# Patient Record
Sex: Male | Born: 1955
Health system: Southern US, Community
[De-identification: ages and names within clinical notes are randomized; demographics above are authoritative.]

## PROBLEM LIST (undated history)

## (undated) DIAGNOSIS — C4491 Basal cell carcinoma of skin, unspecified: Secondary | ICD-10-CM

## (undated) DIAGNOSIS — Z9289 Personal history of other medical treatment: Secondary | ICD-10-CM

## (undated) DIAGNOSIS — T8859XA Other complications of anesthesia, initial encounter: Secondary | ICD-10-CM

## (undated) DIAGNOSIS — E119 Type 2 diabetes mellitus without complications: Secondary | ICD-10-CM

## (undated) DIAGNOSIS — R42 Dizziness and giddiness: Secondary | ICD-10-CM

## (undated) DIAGNOSIS — I5189 Other ill-defined heart diseases: Secondary | ICD-10-CM

## (undated) DIAGNOSIS — N400 Enlarged prostate without lower urinary tract symptoms: Secondary | ICD-10-CM

## (undated) DIAGNOSIS — I480 Paroxysmal atrial fibrillation: Secondary | ICD-10-CM

## (undated) DIAGNOSIS — M1990 Unspecified osteoarthritis, unspecified site: Secondary | ICD-10-CM

## (undated) DIAGNOSIS — E785 Hyperlipidemia, unspecified: Secondary | ICD-10-CM

## (undated) HISTORY — DX: Hyperlipidemia, unspecified: E78.5

## (undated) HISTORY — DX: Type 2 diabetes mellitus without complications: E11.9

## (undated) HISTORY — PX: HERNIA REPAIR: SHX51

## (undated) HISTORY — DX: Benign prostatic hyperplasia without lower urinary tract symptoms: N40.0

## (undated) HISTORY — PX: MOHS SURGERY: SHX181

## (undated) HISTORY — DX: Other ill-defined heart diseases: I51.89

## (undated) HISTORY — PX: KNEE SURGERY: SHX244

## (undated) HISTORY — DX: Basal cell carcinoma of skin, unspecified: C44.91

---

## 2009-04-28 ENCOUNTER — Ambulatory Visit: Payer: Self-pay

## 2009-05-24 ENCOUNTER — Ambulatory Visit: Payer: Self-pay | Admitting: Surgery

## 2009-06-04 ENCOUNTER — Ambulatory Visit: Payer: Self-pay | Admitting: Surgery

## 2012-12-26 ENCOUNTER — Other Ambulatory Visit: Payer: Self-pay | Admitting: *Deleted

## 2012-12-26 DIAGNOSIS — E1165 Type 2 diabetes mellitus with hyperglycemia: Secondary | ICD-10-CM | POA: Insufficient documentation

## 2012-12-26 DIAGNOSIS — E119 Type 2 diabetes mellitus without complications: Secondary | ICD-10-CM

## 2012-12-30 ENCOUNTER — Other Ambulatory Visit: Payer: Self-pay

## 2013-01-13 ENCOUNTER — Encounter: Payer: Self-pay | Admitting: Endocrinology

## 2013-01-13 ENCOUNTER — Other Ambulatory Visit: Payer: BC Managed Care – PPO

## 2013-01-13 ENCOUNTER — Ambulatory Visit (INDEPENDENT_AMBULATORY_CARE_PROVIDER_SITE_OTHER): Payer: BC Managed Care – PPO | Admitting: Endocrinology

## 2013-01-13 VITALS — BP 120/70 | HR 77 | Temp 98.2°F | Resp 12 | Ht 75.0 in | Wt 237.2 lb

## 2013-01-13 DIAGNOSIS — E119 Type 2 diabetes mellitus without complications: Secondary | ICD-10-CM

## 2013-01-13 DIAGNOSIS — I10 Essential (primary) hypertension: Secondary | ICD-10-CM

## 2013-01-13 DIAGNOSIS — E7800 Pure hypercholesterolemia, unspecified: Secondary | ICD-10-CM | POA: Insufficient documentation

## 2013-01-13 LAB — URINALYSIS
Hgb urine dipstick: NEGATIVE
Urine Glucose: NEGATIVE
Urobilinogen, UA: 1 (ref 0.0–1.0)

## 2013-01-13 LAB — COMPREHENSIVE METABOLIC PANEL
AST: 22 U/L (ref 0–37)
Albumin: 3.9 g/dL (ref 3.5–5.2)
BUN: 11 mg/dL (ref 6–23)
CO2: 26 mEq/L (ref 19–32)
Calcium: 9.2 mg/dL (ref 8.4–10.5)
Chloride: 105 mEq/L (ref 96–112)
Glucose, Bld: 94 mg/dL (ref 70–99)
Potassium: 3.5 mEq/L (ref 3.5–5.1)

## 2013-01-13 LAB — HEMOGLOBIN A1C: Hgb A1c MFr Bld: 9.8 % — ABNORMAL HIGH (ref 4.6–6.5)

## 2013-01-13 NOTE — Progress Notes (Signed)
Patient ID: Michael Gross, male   DOB: 1956-03-25, 57 y.o.   MRN: [id number]  Michael Gross is an 57 y.o. male.   Reason for Appointment: Diabetes follow-up   History of Present Illness   Diagnosis: Type 2 DIABETES MELITUS, long-standing        His blood sugar has been generally better controlled with adding Victoza to his previous basal bolus insulin regimen. However in the last year or so his control has been inconsistent because of difficulty affording his medications and inconsistent compliance with these Over the  last month he has not taken any Victoza because of expense. He has been supported with samples periodically He thinks he is adjusting his insulin based on his meal size and activity level Also has not checked many blood sugars lately and they appear to be fairly good despite not taking Victoza Not marking his glucose readings as after or before meals Also complaining about difficulty affording his One Touch strips  Oral hypoglycemic drugs: Metformin        Side effects from medications: None Insulin regimen: Lantus 46 units. Apidra 35 units at breakfast, 22 at lunch and about 32-34 for supper         Proper timing of medications in relation to meals: Yes.         Monitors blood glucose: Once a day.    Glucometer: One Touch.          Blood Glucose readings from meter download: readings before breakfast: 121, mid morning and midday 86-187, afternoon/evening 90-125 with readings only on 7 days out of 14  Hypoglycemia frequency:  none recently.          Meals: 3 meals per day.  small portions usually         Physical activity: exercise: No formal exercise               The last HbgA1c report is not available, records being transferred  [facility] Readings from Last 3 Encounters:  01/13/13 237 lb 3.2 oz (107.593 kg)    No results found for any previous visit.    Medication List       This list is accurate as of: 01/13/13  3:56 PM.  Always use your most recent med list.                APIDRA SOLOSTAR 100 UNIT/ML Sopn  Generic drug:  Insulin Glulisine  35 Units 3 (three) times daily. 35 units at breakfast, 24-26 at lunch 32-34 at dinner     GLUCOPHAGE XR 750 MG 24 hr tablet  Generic drug:  metFORMIN  Take 750 mg by mouth 3 (three) times daily.     insulin glargine 100 units/mL Soln  Commonly known as:  LANTUS  Inject 46 Units into the skin daily at 10 pm.     olmesartan 40 MG tablet  Commonly known as:  BENICAR  Take 40 mg by mouth daily.     rosuvastatin 40 MG tablet  Commonly known as:  CRESTOR  Take 40 mg by mouth daily.     VICTOZA 18 MG/3ML Sopn  Generic drug:  Liraglutide  Inject 1.2 mg into the skin.        Allergies: No Known Allergies  No past medical history on file.  No past surgical history on file.  No family history on file.  Social History:  reports that he has never smoked. He has never used smokeless tobacco. His alcohol and drug histories  are not on file.  Review of Systems:  HYPERTENSION:  no home monitoring, no dizziness. Has been on Benicar for quite some time  HYPERLIPIDEMIA: The lipid abnormality consists of elevated LDL treated with Crestor. His lipids from his work showed LDL 129 and he admits to being irregular with his Crestor because of cost.     Examination:   BP 120/70  Pulse 77  Temp(Src) 98.2 F (36.8 C)  Resp 12  Ht 6\' 3"  (1.905 m)  Wt 237 lb 3.2 oz (107.593 kg)  BMI 29.65 kg/m2  SpO2 98%  Body mass index is 29.65 kg/(m^2).   ASSESSMENT/ PLAN::   Diabetes type 2   The patient's diabetes control appears to be fairly good recently with his home readings although will need to check A1c He continues to have difficulties with inconsistent compliance with his medications and Victoza because of cost Also not usually checking blood sugars enough because of cost Discussed that he is likely to be insulin-dependent long-term  Recommendations made today: Start Invokana for multiple benefits  including weight loss, improved blood sugar control and lower out-of-pocket cost. Discussed actions and side effects as well as dosage titration. He wants to try a sample for 10 days and call if improved sugars are evident He may leave off Victoza because of the expense and recently not having any worsening with his control Again and needs to adjust insulin based on blood sugar levels and meal size, may need to reduce Lantus if glucose better with Invokana Recommended regular exercise He can try a genetic monitor from [employer] and keep a record of his blood sugars for review on the next visit  Hypertension: Blood pressure is excellent but advised him to reduce Benicar with taking Invokana Lipids: Not controlled because of noncompliance of medication. Given him 30 day coupon for Crestor. Consider switching to Lipitor for lower-cost  [facility] 01/13/2013, 3:56 PM   Addendum: Labs as follows Office Visit on 01/13/2013  Component Date Value Range Status  . Hemoglobin A1C 01/13/2013 9.8* 4.6 - 6.5 % Final   Glycemic Control Guidelines for People with Diabetes:Non Diabetic:  <6%Goal of Therapy: <7%Additional Action Suggested:  >8%   . Sodium 01/13/2013 138  135 - 145 mEq/L Final  . Potassium 01/13/2013 3.5  3.5 - 5.1 mEq/L Final  . Chloride 01/13/2013 105  96 - 112 mEq/L Final  . CO2 01/13/2013 26  19 - 32 mEq/L Final  . Glucose, Bld 01/13/2013 94  70 - 99 mg/dL Final  . BUN 96/08/5407 11  6 - 23 mg/dL Final  . Creatinine, Ser 01/13/2013 0.7  0.4 - 1.5 mg/dL Final  . Total Bilirubin 01/13/2013 0.5  0.3 - 1.2 mg/dL Final  . Alkaline Phosphatase 01/13/2013 64  39 - 117 U/L Final  . AST 01/13/2013 22  0 - 37 U/L Final  . ALT 01/13/2013 22  0 - 53 U/L Final  . Total Protein 01/13/2013 7.2  6.0 - 8.3 g/dL Final  . Albumin 81/19/1478 3.9  3.5 - 5.2 g/dL Final  . Calcium 29/56/2130 9.2  8.4 - 10.5 mg/dL Final  . GFR 86/57/8469 121.41  >60.00 mL/min Final  . Color, Urine 01/13/2013 LT. YELLOW   Yellow;Lt. Yellow Final  . APPearance 01/13/2013 CLEAR  Clear Final  . Specific Gravity, Urine 01/13/2013 1.025  1.000-1.030 Final  . pH 01/13/2013 7.5  5.0 - 8.0 Final  . Total Protein, Urine 01/13/2013 NEGATIVE  Negative Final  . Urine Glucose 01/13/2013 NEGATIVE  Negative Final  .  Ketones, ur 01/13/2013 NEGATIVE  Negative Final  . Bilirubin Urine 01/13/2013 NEGATIVE  Negative Final  . Hgb urine dipstick 01/13/2013 NEGATIVE  Negative Final  . Urobilinogen, UA 01/13/2013 1.0  0.0 - 1.0 Final  . Leukocytes, UA 01/13/2013 NEGATIVE  Negative Final  . Nitrite 01/13/2013 NEGATIVE  Negative Final  . Microalb, Ur 01/13/2013 0.9  0.0 - 1.9 mg/dL Final  . Creatinine,U 78/29/5621 183.5   Final  . Microalb Creat Ratio 01/13/2013 0.5  0.0 - 30.0 mg/g Final

## 2013-01-13 NOTE — Patient Instructions (Addendum)
Please check blood sugars at least half the time about 2 hours after any meal and as directed on waking up. Please bring blood sugar monitor to each visit  Invokana 100mg  for 5 days then 300mg    Benicar 1/2 daily while on Invokana, use [employer] Meter   [employer] Prime meter

## 2013-01-14 LAB — MICROALBUMIN / CREATININE URINE RATIO: Microalb, Ur: 0.9 mg/dL (ref 0.0–1.9)

## 2013-01-15 ENCOUNTER — Telehealth: Payer: Self-pay | Admitting: *Deleted

## 2013-01-15 DIAGNOSIS — I152 Hypertension secondary to endocrine disorders: Secondary | ICD-10-CM | POA: Insufficient documentation

## 2013-01-15 DIAGNOSIS — I10 Essential (primary) hypertension: Secondary | ICD-10-CM | POA: Insufficient documentation

## 2013-01-15 NOTE — Telephone Encounter (Signed)
Called Michael Gross and advised him that his A1c was poor, 9.8. And Dr Lucianne Muss advised him to do more sugar checks after meals and better diet. Michael Gross understood.

## 2013-01-21 ENCOUNTER — Telehealth: Payer: Self-pay | Admitting: *Deleted

## 2013-01-21 ENCOUNTER — Telehealth: Payer: Self-pay | Admitting: Endocrinology

## 2013-01-21 MED ORDER — CANAGLIFLOZIN 300 MG PO TABS: 300.0000 mg | ORAL_TABLET | Freq: Every day | ORAL | Status: DC

## 2013-01-21 NOTE — Telephone Encounter (Signed)
[phone], pt. Called earlier, wanted to call w/ the correct #. This is the corected # / Sherri S.

## 2013-01-21 NOTE — Telephone Encounter (Signed)
rx sent

## 2013-01-21 NOTE — Telephone Encounter (Signed)
Patient says he had to go to the clinic at the hospital yesterday and they noticed a large amount of sugar in his urine, which is what he thinks you told him.  He needs his last  A1c, state of condition, adherence of treatment.  [facility] [place] [facility] [phone]  Pt CB # [phone]

## 2013-01-21 NOTE — Telephone Encounter (Signed)
Ok to send note with release of records

## 2013-01-22 ENCOUNTER — Telehealth: Payer: Self-pay | Admitting: Endocrinology

## 2013-01-22 NOTE — Telephone Encounter (Signed)
Please call pt, re: wants labs-A1C drawn / Sherri S.

## 2013-03-24 ENCOUNTER — Other Ambulatory Visit: Payer: Self-pay | Admitting: *Deleted

## 2013-03-24 MED ORDER — OLMESARTAN MEDOXOMIL 40 MG PO TABS: 40.0000 mg | ORAL_TABLET | Freq: Every day | ORAL | Status: DC

## 2013-03-24 MED ORDER — ROSUVASTATIN CALCIUM 40 MG PO TABS: 40.0000 mg | ORAL_TABLET | Freq: Every day | ORAL | Status: DC

## 2013-03-27 ENCOUNTER — Telehealth: Payer: Self-pay | Admitting: Internal Medicine

## 2013-03-27 ENCOUNTER — Other Ambulatory Visit: Payer: Self-pay | Admitting: *Deleted

## 2013-03-27 MED ORDER — INSULIN GLARGINE 100 UNIT/ML SOLOSTAR PEN
PEN_INJECTOR | SUBCUTANEOUS | Status: DC
Start: 2013-03-27 — End: 2013-12-17

## 2013-03-27 MED ORDER — INSULIN PEN NEEDLE 32G X 4 MM MISC: Status: DC

## 2013-03-27 MED ORDER — ONETOUCH DELICA LANCETS FINE MISC: Status: DC

## 2013-03-27 MED ORDER — GLUCOSE BLOOD VI STRP: ORAL_STRIP | Status: DC

## 2013-03-27 MED ORDER — INSULIN GLULISINE 100 UNIT/ML SOLOSTAR PEN: 35.0000 [IU] | PEN_INJECTOR | Freq: Three times a day (TID) | SUBCUTANEOUS | Status: DC

## 2013-03-27 NOTE — Telephone Encounter (Signed)
Pt states he was returning Rhonda's phone call Call back [phone]  Thank You :)

## 2013-04-14 ENCOUNTER — Ambulatory Visit (INDEPENDENT_AMBULATORY_CARE_PROVIDER_SITE_OTHER): Payer: BC Managed Care – PPO | Admitting: Endocrinology

## 2013-04-14 ENCOUNTER — Encounter: Payer: Self-pay | Admitting: Endocrinology

## 2013-04-14 ENCOUNTER — Other Ambulatory Visit: Payer: Self-pay | Admitting: *Deleted

## 2013-04-14 VITALS — BP 108/72 | HR 69 | Temp 98.3°F | Resp 12 | Ht 75.0 in | Wt 221.6 lb

## 2013-04-14 DIAGNOSIS — I10 Essential (primary) hypertension: Secondary | ICD-10-CM

## 2013-04-14 DIAGNOSIS — IMO0001 Reserved for inherently not codable concepts without codable children: Secondary | ICD-10-CM

## 2013-04-14 DIAGNOSIS — E119 Type 2 diabetes mellitus without complications: Secondary | ICD-10-CM

## 2013-04-14 DIAGNOSIS — E7800 Pure hypercholesterolemia, unspecified: Secondary | ICD-10-CM

## 2013-04-14 MED ORDER — GLUCOSE BLOOD VI STRP: ORAL_STRIP | Status: DC

## 2013-04-14 MED ORDER — METFORMIN HCL ER 750 MG PO TB24: 750.0000 mg | ORAL_TABLET | Freq: Three times a day (TID) | ORAL | Status: DC

## 2013-04-14 MED ORDER — INSULIN GLULISINE 100 UNIT/ML SOLOSTAR PEN: 35.0000 [IU] | PEN_INJECTOR | Freq: Three times a day (TID) | SUBCUTANEOUS | Status: DC

## 2013-04-14 NOTE — Progress Notes (Addendum)
Patient ID: Michael Gross, male   DOB: [birth year], 57 y.o.   MRN: [id number]  Reason for Appointment: Diabetes follow-up   History of Present Illness   Diagnosis: Type 2 DIABETES MELITUS, long-standing        His blood sugar had been generally better controlled with adding Victoza to his previous basal bolus insulin regimen. However in the last year or so his control has been inconsistent because of difficulty affording his medications and inconsistent compliance especially with Victoza  He has been supported with samples periodically  Insulin regimen: Lantus 44 units. Apidra 20 units at breakfast,10-12 at lunch and 18-20 for supper  Recent history:  Because of poor control and difficulty losing weight he was started on Invokana on his last visit in September He has been able to take this regularly without side effects and has no difficulty with the cost. Has had excellent results with this With blood sugars starting to improve he has progressively cut down the dose of his Apidra insulin at mealtimes However he has not adjusted his Lantus. All his FASTING blood sugars in the mornings appear to be high with the lowest reading only 168. He thinks this is from forgetting to take his Lantus at night 2-3 times a week; also he tends to get shaky around supper time if he is eating late and then will not take any insulin  He  is adjusting his insulin MEALTIME based 2-4 units on his meal size and activity level  However has not checked  blood sugars after meals much and mostly before and after lunchtime Not marking his glucose readings as after or before meals and not clear what his readings are after breakfast or supper Proper timing of medications in relation to meals: misses evening doses of insulin 2-3 x per week  Oral hypoglycemic drugs: Metformin: ran out, Invokana       Side effects from medications: None              Monitors blood glucose: Once a day.    Glucometer: One Touch.           Blood Glucose readings from meter download: FASTING median 200, range 160-246, MIDDAY median 130, range 63-167, afternoon 99-166 with overall median 135   Hypoglycemia frequency:  documented only once with a reading of 63 around lunchtime, may have symptomatic low sugars at supper     Meals: 3 meals per day. has a biscuit in the morning       Physical activity: exercise: No formal exercise                Wt Readings from Last 3 Encounters:  04/14/13 221 lb 9.6 oz (100.517 kg)  01/13/13 237 lb 3.2 oz (107.593 kg)    Lab Results  Component Value Date   HGBA1C 9.8* 04/14/2013   HGBA1C 9.8* 01/13/2013   Lab Results  Component Value Date   MICROALBUR 0.9 01/13/2013   LDLCALC 114* 04/14/2013   CREATININE 0.8 04/14/2013       Medication List       This list is accurate as of: 04/14/13 11:59 PM.  Always use your most recent med list.               Canagliflozin 300 MG Tabs  Commonly known as:  INVOKANA  Take 1 tablet (300 mg total) by mouth daily.     glucose blood test strip  Commonly known as:  ONE TOUCH ULTRA TEST  Use as  instructed to check blood sugars 5 times per day     Insulin Glargine 100 UNIT/ML Sopn  Commonly known as:  LANTUS SOLOSTAR  Injects 46 units daily at 10 pm     Insulin Glulisine 100 UNIT/ML Sopn  Commonly known as:  APIDRA SOLOSTAR  Inject 35 Units into the skin 3 (three) times daily. 35 units at breakfast, 24-26 at lunch 32-34 at dinner     Insulin Pen Needle 32G X 4 MM Misc  Commonly known as:  BD PEN NEEDLE NANO U/F  Use with insulin 4 times per day     metFORMIN 750 MG 24 hr tablet  Commonly known as:  GLUCOPHAGE XR  Take 1 tablet (750 mg total) by mouth 3 (three) times daily.     olmesartan 40 MG tablet  Commonly known as:  BENICAR  Take 1 tablet (40 mg total) by mouth daily.     ONETOUCH DELICA LANCETS FINE Misc  Use to check blood sugars 5 times per day     rosuvastatin 40 MG tablet  Commonly known as:  CRESTOR  Take 1 tablet (40 mg  total) by mouth daily.        Allergies: No Known Allergies  No past medical history on file.  No past surgical history on file.  No family history on file.  Social History:  reports that he has never smoked. He has never used smokeless tobacco. His alcohol and drug histories are not on file.  Review of Systems:  HYPERTENSION:  no home monitoring, no dizziness. Has been on Benicar for quite some time  HYPERLIPIDEMIA: The lipid abnormality consists of elevated LDL treated with Crestor. His lipids from his work showed LDL 129 and he admits to being irregular with his Crestor because of cost.     Examination:   BP 108/72  Pulse 69  Temp(Src) 98.3 F (36.8 C)  Resp 12  Ht 6\' 3"  (1.905 m)  Wt 221 lb 9.6 oz (100.517 kg)  BMI 27.70 kg/m2  SpO2 98%  Body mass index is 27.7 kg/(m^2).   ASSESSMENT/ PLAN::   Diabetes type 2   The patient's diabetes control appears to be fairly good recently with his home readings although will need to check A1c again He has benefited from [facility] with significant amount of weight loss and reducing his Apidra at mealtimes significantly He continues to have difficulties with inconsistent compliance with his evening insulin Also appears to have marked increase in fasting blood sugars, probably because of taking Lantus very sporadically Also not usually checking blood sugars after supper and may have high readings at that time especially when he is skipping his evening dose See history of present illness for detailed discussion of his blood sugar patterns Also discussed the patient in detail how to adjust his Lantus based on fasting blood sugar trend which she needs to do more regularly. He can start with 30 units of Lantus and take it right at suppertime; lower dose should avoid tendency to low blood sugars before supper Also needs to adjust his supper time dose based on postprandial monitoring He can continue to leave off VICTOZA since he is losing  weight and needing less mealtime insulin; he cannot afford this anyway  Hypertension: Blood pressure is excellent but advised him to reduce Benicar to half tablet with taking Invokana  Lipids: Not controlled usually because of noncompliance with his Crestor because of financial issues Will check again today Consider switching to Lipitor for lower-cost  Counseling  time over 50% of today's 25 minute visit  Giabella Duhart 04/15/2013, 3:21 PM   Addendum: Labs as follows Office Visit on 04/14/2013  Component Date Value Range Status  . Sodium 04/14/2013 136  135 - 145 mEq/L Final  . Potassium 04/14/2013 4.1  3.5 - 5.1 mEq/L Final  . Chloride 04/14/2013 105  96 - 112 mEq/L Final  . CO2 04/14/2013 23  19 - 32 mEq/L Final  . Glucose, Bld 04/14/2013 113* 70 - 99 mg/dL Final  . BUN 16/02/9603 16  6 - 23 mg/dL Final  . Creatinine, Ser 04/14/2013 0.8  0.4 - 1.5 mg/dL Final  . Calcium 54/01/8118 9.0  8.4 - 10.5 mg/dL Final  . GFR 14/78/2956 101.30  >60.00 mL/min Final  . Hemoglobin A1C 04/14/2013 9.8* 4.6 - 6.5 % Final   Glycemic Control Guidelines for People with Diabetes:Non Diabetic:  <6%Goal of Therapy: <7%Additional Action Suggested:  >8%   . Cholesterol 04/14/2013 170  0 - 200 mg/dL Final   ATP III Classification       Desirable:  < 200 mg/dL               Borderline High:  200 - 239 mg/dL          High:  > = 213 mg/dL  . Triglycerides 04/14/2013 109.0  0.0 - 149.0 mg/dL Final   Normal:  <086 mg/dLBorderline High:  150 - 199 mg/dL  . HDL 04/14/2013 33.80* >39.00 mg/dL Final  . VLDL 57/84/6962 21.8  0.0 - 40.0 mg/dL Final  . LDL Cholesterol 04/14/2013 114* 0 - 99 mg/dL Final  . Total CHOL/HDL Ratio 04/14/2013 5   Final                  Men          Women1/2 Average Risk     3.4          3.3Average Risk          5.0          4.42X Average Risk          9.6          7.13X Average Risk          15.0          11.0

## 2013-04-14 NOTE — Patient Instructions (Addendum)
Please check blood sugars at least half the time about 2 hours after any meal and as directed on waking up. Please bring blood sugar monitor to each visit  Lantus 30 at supper, adjust to keep am sugar 90-130   Start Metformin at supper: all 3 same time  Reduce Benicar to 1/2  Exercise 4x per week

## 2013-04-15 LAB — BASIC METABOLIC PANEL
BUN: 16 mg/dL (ref 6–23)
Chloride: 105 mEq/L (ref 96–112)
Creatinine, Ser: 0.8 mg/dL (ref 0.4–1.5)
GFR: 101.3 mL/min (ref >60.00)
Glucose, Bld: 113 mg/dL — ABNORMAL HIGH (ref 70–99)

## 2013-04-15 LAB — HEMOGLOBIN A1C: Hgb A1c MFr Bld: 9.8 % — ABNORMAL HIGH (ref 4.6–6.5)

## 2013-04-15 LAB — LIPID PANEL
Total CHOL/HDL Ratio: 5
VLDL: 21.8 mg/dL (ref 0.0–40.0)

## 2013-04-15 NOTE — Progress Notes (Signed)
Quick Note:  Please let patient know that the A1c is exactly the same at 9.8, needs better control of sugars after supper and fasting as discussed Need to know if he is taking Crestor regularly, cholesterol still high. We can mail the lab results to him ______

## 2013-07-31 ENCOUNTER — Other Ambulatory Visit: Payer: Self-pay | Admitting: *Deleted

## 2013-07-31 MED ORDER — CANAGLIFLOZIN 300 MG PO TABS: 300.0000 mg | ORAL_TABLET | Freq: Every day | ORAL | Status: DC

## 2013-09-23 ENCOUNTER — Other Ambulatory Visit: Payer: Self-pay | Admitting: *Deleted

## 2013-09-23 MED ORDER — OLMESARTAN MEDOXOMIL 40 MG PO TABS: 40.0000 mg | ORAL_TABLET | Freq: Every day | ORAL | Status: DC

## 2013-09-23 MED ORDER — ROSUVASTATIN CALCIUM 40 MG PO TABS: 40.0000 mg | ORAL_TABLET | Freq: Every day | ORAL | Status: DC

## 2013-09-23 MED ORDER — CANAGLIFLOZIN 300 MG PO TABS: 300.0000 mg | ORAL_TABLET | Freq: Every day | ORAL | Status: DC

## 2013-10-07 ENCOUNTER — Other Ambulatory Visit: Payer: Self-pay | Admitting: *Deleted

## 2013-10-07 MED ORDER — METFORMIN HCL ER 750 MG PO TB24: 750.0000 mg | ORAL_TABLET | Freq: Three times a day (TID) | ORAL | Status: DC

## 2013-12-04 ENCOUNTER — Telehealth: Payer: Self-pay

## 2013-12-04 NOTE — Telephone Encounter (Signed)
Diabetic Bundle. Lvom for pt to call and schedule appointment with Dr. Dwyane Dee.

## 2013-12-10 ENCOUNTER — Other Ambulatory Visit: Payer: Self-pay | Admitting: *Deleted

## 2013-12-10 MED ORDER — ROSUVASTATIN CALCIUM 40 MG PO TABS: 40.0000 mg | ORAL_TABLET | Freq: Every day | ORAL | Status: DC

## 2013-12-17 ENCOUNTER — Ambulatory Visit (INDEPENDENT_AMBULATORY_CARE_PROVIDER_SITE_OTHER): Payer: BC Managed Care – PPO | Admitting: Endocrinology

## 2013-12-17 ENCOUNTER — Encounter: Payer: Self-pay | Admitting: Endocrinology

## 2013-12-17 VITALS — BP 130/57 | HR 68 | Temp 98.0°F | Resp 16 | Ht 75.0 in | Wt 220.6 lb

## 2013-12-17 DIAGNOSIS — E1165 Type 2 diabetes mellitus with hyperglycemia: Principal | ICD-10-CM

## 2013-12-17 DIAGNOSIS — IMO0001 Reserved for inherently not codable concepts without codable children: Secondary | ICD-10-CM

## 2013-12-17 DIAGNOSIS — E7800 Pure hypercholesterolemia, unspecified: Secondary | ICD-10-CM

## 2013-12-17 LAB — COMPREHENSIVE METABOLIC PANEL
ALK PHOS: 58 U/L (ref 39–117)
ALT: 22 U/L (ref 0–53)
AST: 23 U/L (ref 0–37)
Albumin: 4 g/dL (ref 3.5–5.2)
BILIRUBIN TOTAL: 0.7 mg/dL (ref 0.2–1.2)
BUN: 17 mg/dL (ref 6–23)
CO2: 25 mEq/L (ref 19–32)
Calcium: 9.6 mg/dL (ref 8.4–10.5)
Chloride: 106 mEq/L (ref 96–112)
Creatinine, Ser: 0.8 mg/dL (ref 0.4–1.5)
GFR: 101.06 mL/min (ref >60.00)
Glucose, Bld: 97 mg/dL (ref 70–99)
Potassium: 3.5 mEq/L (ref 3.5–5.1)
Sodium: 137 mEq/L (ref 135–145)
Total Protein: 7.1 g/dL (ref 6.0–8.3)

## 2013-12-17 LAB — HEMOGLOBIN A1C: Hgb A1c MFr Bld: 9.2 % — ABNORMAL HIGH (ref 4.6–6.5)

## 2013-12-17 MED ORDER — CANAGLIFLOZIN 100 MG PO TABS: 100.0000 mg | ORAL_TABLET | Freq: Every day | ORAL | Status: DC

## 2013-12-17 NOTE — Progress Notes (Signed)
Patient ID: Michael Gross, male   DOB: 16-Oct-1955, 58 y.o.   MRN: [id number]   Reason for Appointment: Diabetes follow-up   History of Present Illness   Diagnosis: Type 2 DIABETES MELITUS, long-standing        His blood sugar had been generally better controlled with adding Victoza to his previous basal bolus insulin regimen. However in the last year or so his control has been inconsistent because of difficulty affording his medications and inconsistent compliance especially with Victoza  He has been supported with samples periodically  Insulin regimen: Lantus 30 units. Apidra 16 units at breakfast,10-12 at lunch and 14-16 for supper   Recent history:  Because of poor control and difficulty losing weight he was started on Invokana in 9/14 Apparently his blood sugars were looking better at home with this but his A1c in 12/14 was still high at 9.8 He has usually been somewhat inconsistent with his compliance with his medications and self-care because of work schedule and cost of medications Has not been seen in followup since 12/14 In the last 2 or 3 weeks he has started drinking smoothies and he thinks that adding apple cider whenever is helping him with his sugar control. He has been able to cut back some on his Apidra insulin also has not lost any weight Today he has had a glucose of 59 after lunch Previously his fasting blood sugars were consistently high from forgetting to take Lantus periodically Also had been not consistent with taking metformin Recently his blood sugars have been sporadically higher at various time but he had no readings after supper despite reminders Has not lost any weight He thinks that he had been having very frequent urination including at night from [place] and in the last week or so has cut this down to a half a tablet with reduction in his nocturia 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 Oral hypoglycemic drugs: Metformin:,  Invokana       Side effects from medications:? Polyuria from [place] blood glucose: Once a day.    Glucometer: One Touch.          Blood Glucose readings from meter download:  PREMEAL Breakfast  10 AM   noon   6 PM  Overall  Glucose range:  74-195   71-250   98-164   141, 267    Mean/median:  104   110     110     Hypoglycemia: Minimal except after lunch today   Meals: 3 meals per day. Chicken and vegetables mostly, sometimes sandwich       Physical activity: exercise: No formal exercise                Wt Readings from Last 3 Encounters:  12/17/13 220 lb 9.6 oz (100.064 kg)  04/14/13 221 lb 9.6 oz (100.517 kg)  01/13/13 237 lb 3.2 oz (107.593 kg)    Lab Results  Component Value Date   HGBA1C 9.2* 12/17/2013   HGBA1C 9.8* 04/14/2013   HGBA1C 9.8* 01/13/2013   Lab Results  Component Value Date   MICROALBUR 0.9 01/13/2013   LDLCALC 114* 04/14/2013   CREATININE 0.8 12/17/2013       Medication List       This list is accurate as of: 12/17/13  8:52 PM.  Always use your most recent med list.  Canagliflozin 100 MG Tabs  Commonly known as:  INVOKANA  Take 1 tablet (100 mg total) by mouth daily before breakfast.     glucose blood test strip  Commonly known as:  ONE TOUCH ULTRA TEST  Use as instructed to check blood sugars 5 times per day     Insulin Glargine 100 UNIT/ML Solostar Pen  Commonly known as:  LANTUS  28-30 Units.     Insulin Glulisine 100 UNIT/ML Solostar Pen  Commonly known as:  APIDRA  Inject 35 Units into the skin 3 (three) times daily. 18 units at breakfast, 12-14 at lunch 14-16 at dinner     Insulin Pen Needle 32G X 4 MM Misc  Commonly known as:  BD PEN NEEDLE NANO U/F  Use with insulin 4 times per day     metFORMIN 750 MG 24 hr tablet  Commonly known as:  GLUCOPHAGE-XR  Take 1,500 mg by mouth at bedtime.     ONETOUCH DELICA LANCETS FINE Misc  Use to check blood sugars 5 times per day     rosuvastatin 40 MG tablet   Commonly known as:  CRESTOR  Take 1 tablet (40 mg total) by mouth daily.        Allergies: No Known Allergies  No past medical history on file.  No past surgical history on file.  Family History  Problem Relation Age of Onset  . Heart disease Father     Social History:  reports that he has never smoked. He has never used smokeless tobacco. His alcohol and drug histories are not on file.  Review of Systems:  HYPERTENSION:  some monitoring at the drug store. He said that recently he has tried to stop his Benicar since he does not want to take many medications and his blood pressure is reportedly fairly good at the drug store  HYPERLIPIDEMIA: The lipid abnormality consists of elevated LDL treated with Crestor. His lipids previously showed LDL 129 because of noncompliance with Crestor but he thinks he is taking regularly none  Previously had low normal testosterone levels and tried on supplements. Did not continue these because of cost     Examination:   BP 130/57  Pulse 68  Temp(Src) 98 F (36.7 C)  Resp 16  Ht 6\' 3"  (1.905 m)  Wt 220 lb 9.6 oz (100.064 kg)  BMI 27.57 kg/m2  SpO2 94%  Body mass index is 27.57 kg/(m^2).   No ankle edema  ASSESSMENT/ PLAN:   Diabetes type 2   The patient's diabetes control appears to be fairly good recently and has significantly improved fasting readings compared to the last visit Also blood sugars midmorning and lunchtime are mostly good He has been able to reduce his insulin by watching his diet, exercising more and drinking smoothies Also has been regular with his metformin Although he thinks he has polyuria from Invokana some of this may be related to hyperglycemia also which is improving Since his glucose is better on the in the last couple of weeks his A1c may still be high He is considering trying to get off insulin although he has been on this for probably 5 years and is likely insulin-dependent. Discussed adjusting his  insulin doses based on postprandial readings which he needs to do especially after supper Also adjust Lantus down now since fasting readings are low normal last 2 days  Discussed adding Victoza also to help possibly reduce insulin requirement but not clear this is helping as much previously and he wants  to wait on this. Advised him that would like to continue Invokana since it had helped him previously and would also benefit his blood pressure He can try 100 mg to reduce possibility of polyuria  Hypertension: Blood pressure is so far fairly good with leaving off Benicar.  Lipids: Previously controlled usually because of noncompliance with his Crestor because of financial issues Will check again  Consider switching to Lipitor for lower-cost  Patient Instructions  Lantus 26 units daily  Apidra 12 units at breakfast, 8-10  at lunch and 12 for supper  More sugar after dinner also  Invokana 100mg  daily in am from next time     Counseling time over 50% of today's 25 minute visit  Michael Gross 12/17/2013, 8:52 PM   Addendum: Labs as follows Office Visit on 12/17/2013  Component Date Value Ref Range Status  . Hemoglobin A1C 12/17/2013 9.2* 4.6 - 6.5 % Final   Glycemic Control Guidelines for People with Diabetes:Non Diabetic:  <6%Goal of Therapy: <7%Additional Action Suggested:  >8%   . Sodium 12/17/2013 137  135 - 145 mEq/L Final  . Potassium 12/17/2013 3.5  3.5 - 5.1 mEq/L Final  . Chloride 12/17/2013 106  96 - 112 mEq/L Final  . CO2 12/17/2013 25  19 - 32 mEq/L Final  . Glucose, Bld 12/17/2013 97  70 - 99 mg/dL Final  . BUN 12/17/2013 17  6 - 23 mg/dL Final  . Creatinine, Ser 12/17/2013 0.8  0.4 - 1.5 mg/dL Final  . Total Bilirubin 12/17/2013 0.7  0.2 - 1.2 mg/dL Final  . Alkaline Phosphatase 12/17/2013 58  39 - 117 U/L Final  . AST 12/17/2013 23  0 - 37 U/L Final  . ALT 12/17/2013 22  0 - 53 U/L Final  . Total Protein 12/17/2013 7.1  6.0 - 8.3 g/dL Final  . Albumin 12/17/2013  4.0  3.5 - 5.2 g/dL Final  . Calcium 12/17/2013 9.6  8.4 - 10.5 mg/dL Final  . GFR 12/17/2013 101.06  >60.00 mL/min Final

## 2013-12-17 NOTE — Patient Instructions (Addendum)
Lantus 26 units daily  Apidra 12 units at breakfast, 8-10  at lunch and 12 for supper  More sugar after dinner also  Invokana 100mg  daily in am from next time

## 2013-12-18 LAB — LIPID PANEL
CHOLESTEROL: 147 mg/dL (ref 0–200)
HDL: 48 mg/dL (ref >39.00)
LDL Cholesterol: 87 mg/dL (ref 0–99)
NonHDL: 99
TRIGLYCERIDES: 62 mg/dL (ref 0.0–149.0)
Total CHOL/HDL Ratio: 3
VLDL: 12.4 mg/dL (ref 0.0–40.0)

## 2013-12-19 NOTE — Progress Notes (Signed)
Quick Note:  Cholesterol better with A1c high at 9.2 ______

## 2014-01-01 ENCOUNTER — Telehealth: Payer: Self-pay | Admitting: Endocrinology

## 2014-01-01 NOTE — Telephone Encounter (Signed)
Patient asked if you would call him.

## 2014-01-02 NOTE — Telephone Encounter (Signed)
Patient is returning your call.  

## 2014-01-05 ENCOUNTER — Telehealth: Payer: Self-pay | Admitting: *Deleted

## 2014-01-05 ENCOUNTER — Other Ambulatory Visit: Payer: Self-pay | Admitting: *Deleted

## 2014-01-05 NOTE — Telephone Encounter (Signed)
Make sure he is taking his blood sugar 2 hours after meals at night also

## 2014-01-05 NOTE — Telephone Encounter (Signed)
Patient wanted you to know he has stopped taking his insulin as of Monday. He said he hardly eats any carbs and the highest his sugar has been is 141.

## 2014-01-15 ENCOUNTER — Other Ambulatory Visit: Payer: Self-pay | Admitting: *Deleted

## 2014-01-15 MED ORDER — ROSUVASTATIN CALCIUM 40 MG PO TABS: 40.0000 mg | ORAL_TABLET | Freq: Every day | ORAL | Status: DC

## 2014-01-15 MED ORDER — METFORMIN HCL ER 750 MG PO TB24: 1500.0000 mg | ORAL_TABLET | Freq: Every day | ORAL | Status: DC

## 2014-02-13 ENCOUNTER — Other Ambulatory Visit: Payer: BC Managed Care – PPO

## 2014-02-16 ENCOUNTER — Ambulatory Visit: Payer: BC Managed Care – PPO | Admitting: Endocrinology

## 2014-02-23 ENCOUNTER — Ambulatory Visit: Payer: BC Managed Care – PPO | Admitting: Endocrinology

## 2014-12-02 ENCOUNTER — Other Ambulatory Visit (INDEPENDENT_AMBULATORY_CARE_PROVIDER_SITE_OTHER): Payer: BLUE CROSS/BLUE SHIELD | Admitting: *Deleted

## 2014-12-02 ENCOUNTER — Ambulatory Visit (INDEPENDENT_AMBULATORY_CARE_PROVIDER_SITE_OTHER): Payer: BLUE CROSS/BLUE SHIELD | Admitting: Endocrinology

## 2014-12-02 ENCOUNTER — Encounter: Payer: Self-pay | Admitting: Endocrinology

## 2014-12-02 VITALS — BP 124/76 | HR 67 | Temp 98.2°F | Resp 16 | Ht 75.0 in | Wt 210.8 lb

## 2014-12-02 DIAGNOSIS — E7800 Pure hypercholesterolemia, unspecified: Secondary | ICD-10-CM

## 2014-12-02 DIAGNOSIS — IMO0002 Reserved for concepts with insufficient information to code with codable children: Secondary | ICD-10-CM

## 2014-12-02 DIAGNOSIS — E1165 Type 2 diabetes mellitus with hyperglycemia: Secondary | ICD-10-CM

## 2014-12-02 LAB — BASIC METABOLIC PANEL
BUN: 14 mg/dL (ref 6–23)
CHLORIDE: 103 meq/L (ref 96–112)
CO2: 29 mEq/L (ref 19–32)
Calcium: 9.5 mg/dL (ref 8.4–10.5)
Creatinine, Ser: 0.83 mg/dL (ref 0.40–1.50)
GFR: 100.73 mL/min (ref >60.00)
Glucose, Bld: 61 mg/dL — ABNORMAL LOW (ref 70–99)
Potassium: 3.5 mEq/L (ref 3.5–5.1)
Sodium: 138 mEq/L (ref 135–145)

## 2014-12-02 LAB — POCT GLYCOSYLATED HEMOGLOBIN (HGB A1C): HEMOGLOBIN A1C: 9.8

## 2014-12-02 LAB — LIPID PANEL
CHOL/HDL RATIO: 5
Cholesterol: 250 mg/dL — ABNORMAL HIGH (ref 0–200)
HDL: 48.5 mg/dL (ref >39.00)
LDL Cholesterol: 182 mg/dL — ABNORMAL HIGH (ref 0–99)
NonHDL: 201.5
TRIGLYCERIDES: 99 mg/dL (ref 0.0–149.0)
VLDL: 19.8 mg/dL (ref 0.0–40.0)

## 2014-12-02 LAB — MICROALBUMIN / CREATININE URINE RATIO
Creatinine,U: 185.6 mg/dL
MICROALB/CREAT RATIO: 0.7 mg/g (ref 0.0–30.0)
Microalb, Ur: 1.3 mg/dL (ref 0.0–1.9)

## 2014-12-02 MED ORDER — GLUCOSE BLOOD VI STRP: ORAL_STRIP | Status: DC

## 2014-12-02 MED ORDER — FREESTYLE LANCETS MISC: Status: DC

## 2014-12-02 MED ORDER — VICTOZA 18 MG/3ML ~~LOC~~ SOPN: 1.2000 mg | PEN_INJECTOR | Freq: Every day | SUBCUTANEOUS | Status: DC

## 2014-12-02 MED ORDER — GLIMEPIRIDE 2 MG PO TABS: 2.0000 mg | ORAL_TABLET | Freq: Every day | ORAL | Status: DC

## 2014-12-02 MED ORDER — INSULIN PEN NEEDLE 32G X 5 MM MISC: Status: DC

## 2014-12-02 MED ORDER — CANAGLIFLOZIN 100 MG PO TABS: 100.0000 mg | ORAL_TABLET | Freq: Every day | ORAL | Status: DC

## 2014-12-02 NOTE — Patient Instructions (Addendum)
Check blood sugars on waking up ..4  .. times a week Also check blood sugars about 2 hours after a meal and do this after different meals by rotation  Recommended blood sugar levels on waking up is 90-130 and about 2 hours after meal is 140-180 Please bring blood sugar monitor to each visit.  Victoza 0.6mg  for 3-4 days then 1.2mg  daily  Invokana in am

## 2014-12-02 NOTE — Progress Notes (Signed)
Patient ID: Michael Gross, male   DOB: [birth year], 59 y.o.   MRN: [id number]   Reason for Appointment: Diabetes follow-up   History of Present Illness   Diagnosis: Type 2 DIABETES MELITUS, long-standing        His blood sugar had been generally better controlled with adding Victoza to his previous basal bolus insulin regimen. However in the last year or so his control has been inconsistent because of difficulty affording his medications and inconsistent compliance especially with Victoza Because of poor control and difficulty losing weight he was started on Invokana in 9/14  Insulin regimen: None for the last few months, previously taking the following regimen: Lantus 30 units. 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 about 3-4 months ago Also he has not taken [place] for a few months because of media publicity about side effects He says he is trying to control his diabetes with a very low carbohydrate diet and adding cinnamon and Metamucil He has not been checking his blood sugars for "a while" because of the cost of test strips  Not clear what his blood sugar levels are he thinks they are fairly good in the morning if he gets a low carbohydrate diet the night before Does not have any formal exercise program Does have some weight loss but does not think he is excessively thirsty or having increased urination  Oral hypoglycemic drugs: Metformin: 1500-2250 mg daily       Side effects from medications:? Polyuria from [place] blood glucose: None recently     Glucometer: One Touch.          Blood Glucose readings from recall: Am 120-180   Meals:  Low Carb diet  Chicken and vegetables mostly, sometimes sandwich       Physical activity: exercise: No formal  exercise, works as a Administrator, does some gardening and Theatre stage manager Readings from Last 3 Encounters:  12/02/14 210 lb 12.8 oz (95.618 kg)  12/17/13 220 lb 9.6 oz (100.064 kg)  04/14/13 221 lb 9.6 oz (100.517 kg)    Lab Results  Component Value Date   HGBA1C 9.8 12/02/2014   HGBA1C 9.2* 12/17/2013   HGBA1C 9.8* 04/14/2013   Lab Results  Component Value Date   MICROALBUR 0.9 01/13/2013   LDLCALC 87 12/17/2013   CREATININE 0.8 12/17/2013       Medication List       This list is accurate as of: 12/02/14 10:04 AM.  Always use your most recent med list.               canagliflozin 100 MG Tabs tablet  Commonly known as:  INVOKANA  Take 1 tablet (100 mg total) by mouth daily.     freestyle lancets  Use as instructed to check blood sugar 2 times per day     glimepiride 2 MG tablet  Commonly known as:  AMARYL  Take 1 tablet (2 mg total) by mouth daily before supper.     glucose blood test strip  Commonly known as:  FREESTYLE INSULINX TEST  Use as instructed to check blood sugar 2  times per day DX CODE E11.65     Insulin Pen Needle 32G X 5 MM Misc  Commonly known as:  NOVOTWIST  Use one per day to inject Victoza     metFORMIN 750 MG 24 hr tablet  Commonly known as:  GLUCOPHAGE-XR  Take 2 tablets (1,500 mg total) by mouth at bedtime.     VICTOZA 18 MG/3ML Sopn  Generic drug:  Liraglutide  Inject 0.2 mLs (1.2 mg total) into the skin daily. Inject once daily at the same time        Allergies: No Known Allergies  No past medical history on file.  No past surgical history on file.  Family History  Problem Relation Age of Onset  . Heart disease Father     Social History:  reports that he has never smoked. He has never used smokeless tobacco. His alcohol and drug histories are not on file.  Review of Systems:  HYPERTENSION:  he has been monitoring at the drug store. He thinks blood pressure is usually normal except when he first was  an Previously on Benicar  HYPERLIPIDEMIA: The lipid abnormality consists of elevated LDL previously treated with Crestor.  No recent labs available and he stopped his medication because of cost  Lab Results  Component Value Date   CHOL 147 12/17/2013   HDL 48.00 12/17/2013   LDLCALC 87 12/17/2013   TRIG 62.0 12/17/2013   CHOLHDL 3 12/17/2013    Previously had low normal testosterone levels and tried on supplements. Did not continue these because of cost     Examination:   BP 124/76 mmHg  Pulse 67  Temp(Src) 98.2 F (36.8 C)  Resp 16  Ht 6\' 3"  (1.905 m)  Wt 210 lb 12.8 oz (95.618 kg)  BMI 26.35 kg/m2  SpO2 95%  Body mass index is 26.35 kg/(m^2).   No ankle edema Diabetic foot exam shows normal monofilament sensation in the toes and plantar surfaces, no skin lesions or ulcers on the feet and normal pedal pulses.  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 Also not taking [place] because of fear of side effects, this was discussed in detail and reassured him that this is safe and effective at any stage of diabetes  Although he has done well with losing weight with low carbohydrate diet he still has significant hyperglycemia He will need a multidrug regimen including Victoza, Invokana and low dose Amaryl in the evening in addition to his metformin Discussed the regimen in detail and dosage as well as titration of Victoza He will be given a FreeStyle monitor to help with the costPlease check blood  sugars at least half the time about 2 hours after any meal and as directed on waking up. Please bring blood sugar monitor to each visit and discussed when to check his blood sugar and blood sugar targets He will be back in a month for follow-up  Recommended regular eye exams  Hypertension: Blood pressure is so far fairly good without any medication Will need  to consider treatment if he has microalbuminuria  Lipids: Previously controlled with significant changes in diet and will reassess today May also need to be on medications for cardiovascular protection  Patient Instructions  Check blood sugars on waking up ..4  .. times a week Also check blood sugars about 2 hours after a meal and do this after different meals by rotation  Recommended blood sugar levels on waking up is 90-130 and about 2 hours after meal is 140-180 Please bring blood sugar monitor to each visit.  Victoza 0.6mg  for 3-4 days then 1.2mg  daily  Invokana in am   Counseling time on subjects discussed above is over 50% of today's 25 minute visit  Zaia Carre 12/02/2014, 10:04 AM

## 2014-12-03 ENCOUNTER — Other Ambulatory Visit: Payer: Self-pay | Admitting: *Deleted

## 2014-12-03 DIAGNOSIS — E7800 Pure hypercholesterolemia, unspecified: Secondary | ICD-10-CM

## 2014-12-03 MED ORDER — ATORVASTATIN CALCIUM 40 MG PO TABS: 40.0000 mg | ORAL_TABLET | Freq: Every day | ORAL | Status: DC

## 2014-12-03 NOTE — Progress Notes (Signed)
Quick Note:  Please let patient know that the cholesterol is about double of the ideal level, recommend starting generic Lipitor 40 mg daily Will need lipid panel on next visit also ______

## 2014-12-08 ENCOUNTER — Other Ambulatory Visit: Payer: Self-pay | Admitting: *Deleted

## 2014-12-08 MED ORDER — METFORMIN HCL ER 750 MG PO TB24: 1500.0000 mg | ORAL_TABLET | Freq: Every day | ORAL | Status: DC

## 2014-12-21 ENCOUNTER — Other Ambulatory Visit: Payer: Self-pay | Admitting: *Deleted

## 2014-12-21 DIAGNOSIS — E1165 Type 2 diabetes mellitus with hyperglycemia: Secondary | ICD-10-CM

## 2014-12-21 DIAGNOSIS — IMO0002 Reserved for concepts with insufficient information to code with codable children: Secondary | ICD-10-CM

## 2014-12-21 LAB — POCT GLYCOSYLATED HEMOGLOBIN (HGB A1C): HEMOGLOBIN A1C: 9

## 2014-12-21 LAB — POCT GLUCOSE (DEVICE FOR HOME USE): Glucose Fasting, POC: 122 mg/dL — AB (ref 70–99)

## 2014-12-24 ENCOUNTER — Telehealth: Payer: Self-pay | Admitting: Endocrinology

## 2014-12-24 NOTE — Telephone Encounter (Signed)
faxed

## 2014-12-24 NOTE — Telephone Encounter (Signed)
Patient ask you to fax his A1C to this fax # [phone] or [phone]

## 2015-01-07 ENCOUNTER — Ambulatory Visit (INDEPENDENT_AMBULATORY_CARE_PROVIDER_SITE_OTHER): Payer: BLUE CROSS/BLUE SHIELD | Admitting: Endocrinology

## 2015-01-07 ENCOUNTER — Other Ambulatory Visit: Payer: Self-pay | Admitting: *Deleted

## 2015-01-07 ENCOUNTER — Encounter: Payer: Self-pay | Admitting: Endocrinology

## 2015-01-07 VITALS — BP 138/88 | HR 74 | Temp 98.1°F | Resp 16 | Ht 75.0 in | Wt 207.8 lb

## 2015-01-07 DIAGNOSIS — IMO0002 Reserved for concepts with insufficient information to code with codable children: Secondary | ICD-10-CM

## 2015-01-07 DIAGNOSIS — E1165 Type 2 diabetes mellitus with hyperglycemia: Secondary | ICD-10-CM | POA: Diagnosis not present

## 2015-01-07 DIAGNOSIS — E7800 Pure hypercholesterolemia, unspecified: Secondary | ICD-10-CM

## 2015-01-07 MED ORDER — GLIMEPIRIDE 2 MG PO TABS: 2.0000 mg | ORAL_TABLET | Freq: Every day | ORAL | Status: DC

## 2015-01-07 MED ORDER — METFORMIN HCL ER 750 MG PO TB24: 1500.0000 mg | ORAL_TABLET | Freq: Every day | ORAL | Status: DC

## 2015-01-07 MED ORDER — GLUCOSE BLOOD VI STRP: ORAL_STRIP | Status: DC

## 2015-01-07 MED ORDER — CANAGLIFLOZIN 100 MG PO TABS: 100.0000 mg | ORAL_TABLET | Freq: Every day | ORAL | Status: DC

## 2015-01-07 NOTE — Progress Notes (Signed)
Patient ID: Michael Gross, male   DOB: May 15, 1955, 59 y.o.   MRN: [id number]   Reason for Appointment: Diabetes follow-up   History of Present Illness   Diagnosis: Type 2 DIABETES MELITUS, long-standing        His blood sugar had been generally better controlled with adding Victoza to his previous basal bolus insulin regimen. However in the last year or so his control has been inconsistent because of difficulty affording his medications and inconsistent compliance especially with Victoza Because of poor control and difficulty losing weight he was started on Invokana in 9/14  Insulin regimen: None for the last few months, previously taking basal bolus regimen  Recent history:   Since he had refused to restart insulin he was started on a multidrug regimen of Invokana, Victoza, Amaryl and metformin He was also reassured of about the safety of Invokana Also he was told to start checking his blood sugars consistently which he had not been doing His A1c has been consistently about 9% or higher for some time  Current blood sugar patterns, management and problems identified:  He has been following a very low carbohydrate diet on his own.  He thinks that he is fairly satisfied with this and may have extra protein if he gets hungry in between meals.  He has checked his blood sugars throughout the day but not as many after supper; however blood sugars are fairly consistently near normal  He did have some high readings when he was on vacation last week and eating more liberally; however still avoiding sweets  He does not do any formal exercise, he thinks he is relatively busy at work  He has lost some more weight with his diet and starting above medications  No side effects from any medication so far   Oral hypoglycemic drugs: Metformin: 1500-2250 mg daily       Side effects from medications:? Polyuria from [place] blood glucose: None recently      Glucometer: FreeStyle Blood Glucose readings download:   Meals:  Low Carb diet.  Breakfast and lunch is usually boiled egg with small amount of carbohydrate and cheese   Chicken and vegetables mostly at dinner.       Physical activity: exercise: No formal exercise, works as a Administrator, does some gardening and Theatre stage manager Readings from Last 3 Encounters:  01/07/15 207 lb 12.8 oz (94.257 kg)  12/02/14 210 lb 12.8 oz (95.618 kg)  12/17/13 220 lb 9.6 oz (100.064 kg)    Lab Results  Component Value Date   HGBA1C 9.0 12/21/2014   HGBA1C 9.8 12/02/2014   HGBA1C 9.2* 12/17/2013   Lab Results  Component Value Date   MICROALBUR 1.3 12/02/2014   LDLCALC 182* 12/02/2014   CREATININE 0.83 12/02/2014       Medication List       This list is accurate as of: 01/07/15  5:17 PM.  Always use your most recent med list.               atorvastatin 40 MG tablet  Commonly known as:  LIPITOR  Take 1 tablet (40 mg total) by mouth daily.     canagliflozin 100 MG Tabs tablet  Commonly known as:  INVOKANA  Take 1 tablet (100 mg total) by mouth daily.     freestyle lancets  Use  as instructed to check blood sugar 2 times per day     glimepiride 2 MG tablet  Commonly known as:  AMARYL  Take 1 tablet (2 mg total) by mouth daily before supper.     glucose blood test strip  Commonly known as:  FREESTYLE INSULINX TEST  Use as instructed to check blood sugar 2 times per day DX CODE E11.65     Insulin Pen Needle 32G X 5 MM Misc  Commonly known as:  NOVOTWIST  Use one per day to inject Victoza     metFORMIN 750 MG 24 hr tablet  Commonly known as:  GLUCOPHAGE-XR  Take 2 tablets (1,500 mg total) by mouth at bedtime.     VICTOZA 18 MG/3ML Sopn  Generic drug:  Liraglutide  Inject 0.2 mLs (1.2 mg total) into the skin daily. Inject once daily at the same time        Allergies: No Known Allergies  No past medical history on file.  No past surgical history on  file.  Family History  Problem Relation Age of Onset  . Heart disease Father     Social History:  reports that he has never smoked. He has never used smokeless tobacco. His alcohol and drug histories are not on file.  Review of Systems:  HYPERTENSION:  he has been monitoring at the drug store. He thinks blood pressure is usually normal except when he first was an Previously on Benicar  BP Readings from Last 3 Encounters:  01/07/15 138/88  12/02/14 124/76  12/17/13 130/57    HYPERLIPIDEMIA: The lipid abnormality consists of elevated LDL previously treated with Crestor.  No recent labs available and he stopped his medication because of cost  Lab Results  Component Value Date   CHOL 250* 12/02/2014   HDL 48.50 12/02/2014   LDLCALC 182* 12/02/2014   TRIG 99.0 12/02/2014   CHOLHDL 5 12/02/2014    Previously had low normal testosterone levels and tried on supplements. Did not continue these because of cost  Diabetic foot exam in 7/16      Examination:   BP 138/88 mmHg  Pulse 74  Temp(Src) 98.1 F (36.7 C)  Resp 16  Ht 6\' 3"  (1.905 m)  Wt 207 lb 12.8 oz (94.257 kg)  BMI 25.97 kg/m2  SpO2 97%  Body mass index is 25.97 kg/(m^2).   Repeat blood pressure 130/84   ASSESSMENT/ PLAN:   Diabetes type 2   The patient's diabetes control is markedly improved with low carbohydrate diet See history of present illness for detailed discussion of his current management, blood sugar patterns and problems identified  Previously he was insulin-dependent and now with a multidrug regimen including Invokana, Victoza, Amaryl and metformin he is bearing to have excellent control throughout the day He has high readings only when he goes off his diet and has more fat or carbohydrate in his meals He thinks he can continue this management as he does not want to go on insulin because of his job  Recommendations today:  No change in medication  Start walking for exercise  More readings  after supper  He can try adding some whole-grain carbohydrates at meals  Regular eye exam  Flu shot  Will also need Pneumovax  Hypertension history : Blood pressure is fairly good without any medication Will need to consider treatment if he has microalbuminuria  meanwhile continue Invokana which she has been out of for the last 2 days  Lipids: he is back on Lipitor  and will have his levels checked again on the next visit    Pheobe Sandiford 01/07/2015, 5:17 PM

## 2015-02-22 ENCOUNTER — Other Ambulatory Visit: Payer: Self-pay | Admitting: *Deleted

## 2015-02-22 MED ORDER — VICTOZA 18 MG/3ML ~~LOC~~ SOPN: 1.2000 mg | PEN_INJECTOR | Freq: Every day | SUBCUTANEOUS | Status: DC

## 2015-03-22 ENCOUNTER — Telehealth: Payer: Self-pay | Admitting: General Practice

## 2015-03-22 NOTE — Telephone Encounter (Signed)
Pt called wanting to make an appt with you for prostate problems, frequent urination.  He has not been to see you that i can tell since 2010.  Will you let him reestablish care.  He does not presently have a primary doctor and says he hasnt been to a doctor since...  His call back is [phone]  Thank sTeri

## 2015-03-23 NOTE — Telephone Encounter (Signed)
I would like to review his old chart first.

## 2015-03-23 NOTE — Telephone Encounter (Signed)
Okay to reestablish patient care? Please advise

## 2015-03-23 NOTE — Telephone Encounter (Signed)
I spoke with Michael Gross and she is going to request chart from storage.  Thanks TNP

## 2015-03-25 NOTE — Telephone Encounter (Signed)
Per the message I received in staff message box  Michael Gross, [place]  Dorothy Spark           I do not wish to take him as a patient at this time. If he has an acute problem would go to Urgent Care.        I called pt and LMTCB on voicemail. I didn't leave a detailed message on VM just to call back. Thanks TNP

## 2015-03-28 ENCOUNTER — Other Ambulatory Visit: Payer: Self-pay | Admitting: Endocrinology

## 2015-04-07 NOTE — Telephone Encounter (Signed)
Pt advised/MW °

## 2015-04-16 ENCOUNTER — Ambulatory Visit: Payer: BLUE CROSS/BLUE SHIELD | Admitting: Endocrinology

## 2015-04-19 ENCOUNTER — Encounter: Payer: Self-pay | Admitting: Endocrinology

## 2015-04-19 ENCOUNTER — Other Ambulatory Visit: Payer: Self-pay | Admitting: *Deleted

## 2015-04-19 ENCOUNTER — Ambulatory Visit (INDEPENDENT_AMBULATORY_CARE_PROVIDER_SITE_OTHER): Payer: BLUE CROSS/BLUE SHIELD | Admitting: Endocrinology

## 2015-04-19 VITALS — BP 138/82 | HR 77 | Temp 98.6°F | Resp 14 | Ht 75.0 in | Wt 209.6 lb

## 2015-04-19 DIAGNOSIS — E1165 Type 2 diabetes mellitus with hyperglycemia: Secondary | ICD-10-CM | POA: Diagnosis not present

## 2015-04-19 DIAGNOSIS — E1169 Type 2 diabetes mellitus with other specified complication: Secondary | ICD-10-CM | POA: Diagnosis not present

## 2015-04-19 DIAGNOSIS — IMO0002 Reserved for concepts with insufficient information to code with codable children: Secondary | ICD-10-CM

## 2015-04-19 DIAGNOSIS — E7800 Pure hypercholesterolemia, unspecified: Secondary | ICD-10-CM | POA: Diagnosis not present

## 2015-04-19 LAB — POCT GLYCOSYLATED HEMOGLOBIN (HGB A1C): Hemoglobin A1C: 9.3

## 2015-04-19 MED ORDER — GLUCOSE BLOOD VI STRP: ORAL_STRIP | Status: DC

## 2015-04-19 MED ORDER — BAYER MICROLET LANCETS MISC: Status: DC

## 2015-04-19 MED ORDER — CANAGLIFLOZIN 300 MG PO TABS: 300.0000 mg | ORAL_TABLET | Freq: Every day | ORAL | Status: DC

## 2015-04-19 NOTE — Progress Notes (Signed)
Patient ID: Michael Gross, male   DOB: 05/31/1955, 59 y.o.   MRN: [id number]   Reason for Appointment: follow-up   History of Present Illness   Diagnosis: Type 2 DIABETES MELITUS, long-standing        His blood sugar had been generally better controlled with adding Victoza to his previous basal bolus insulin regimen. 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 he was started on a multidrug regimen of Invokana, Victoza, Amaryl and metformin Also he was told to start checking his blood sugars consistently at various times; he usually does not bring his monitor and difficult to confirm his blood sugar readings His A1c has been consistently about 9% or higher for some time and still  not any better  Current blood sugar patterns, management and problems identified:  He has done had a functioning glucose monitor for a month and not clear what his blood sugars are  He claims that his blood sugars are not high usually but his A1c is not any better  He is usually trying to watch carbohydrate intake  Recently not exercising, was advised to start walking on the last visit; also less active in winter time.  He says some of high readings are because of going on a cruise and going off his diet  He is still concerned about need for insulin because of visit I would license  He says that if he does any significantly increased physical activity he will start feeling shaky even of the blood sugar is around 80-90  Oral hypoglycemic drugs: Metformin: 1500-2250 mg daily, Amaryl 2 mg at supper, Invokana 100 mg in the morning        Side effects from medications:? Polyuria from  [place] blood glucose: None      Glucometer: none Blood Glucose readings highest 240, usually fasting readings are 120-130  Meals:  Low Carb diet.  Breakfast and lunch is usually boiled egg with small amount of carbohydrate and cheese   Chicken and vegetables mostly at dinner.       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  Always use your most recent med list.               atorvastatin 40 MG tablet  Commonly known as:  LIPITOR  take 1 tablet by mouth once daily     BAYER MICROLET LANCETS lancets  Use as instructed to check blood sugar 2 times per day  dx code E11.65     canagliflozin 300 MG Tabs tablet  Commonly known as:  INVOKANA  Take 300 mg by mouth daily before breakfast.     glimepiride 2 MG tablet  Commonly known as:  AMARYL  Take 1 tablet (2 mg total) by mouth daily before supper.     glucose blood test strip  Commonly known as:  BAYER CONTOUR NEXT TEST  Use as instructed to check blood sugar 2 times per day dx code E11.65     Insulin Pen Needle 32G X 5 MM Misc  Commonly known as:  NOVOTWIST  Use one per day to inject Victoza     metFORMIN 750 MG 24 hr tablet  Commonly known as:  GLUCOPHAGE-XR  Take 2 tablets (1,500 mg total) by mouth at bedtime.     VICTOZA 18 MG/3ML Sopn  Generic drug:  Liraglutide  Inject 0.2 mLs (1.2 mg total) into the skin daily. Inject once daily at the same time        Allergies: No Known Allergies  No  past medical history on file.  No past surgical history on file.  Family History  Problem Relation Age of Onset  . Heart disease Father     Social History:  reports that he has never smoked. He has never used smokeless tobacco. His alcohol and drug histories are not on file.  Review of Systems:  HYPERTENSION:  he has has not been on any medications, occasionally blood pressures are higher in the office   Previously on Benicar  BP Readings from Last 3 Encounters:  04/19/15 138/82  01/07/15 138/88  12/02/14 124/76    HYPERLIPIDEMIA: The lipid abnormality consists of elevated LDL previously treated with Crestor.  No recent labs available since he restarted Lipitor, he thinks he is taking this   Lab Results  Component Value Date   CHOL 250* 12/02/2014   HDL 48.50 12/02/2014   LDLCALC 182* 12/02/2014   TRIG 99.0 12/02/2014   CHOLHDL 5 12/02/2014    Previously had low normal testosterone levels and tried on supplements. Did not continue these because of cost  Diabetic foot exam in 7/16      Examination:   BP 138/82 mmHg  Pulse 77  Temp(Src) 98.6 F (37 C)  Resp 14  Ht 6\' 3"  (1.905 m)  Wt 209 lb 9.6 oz (95.074 kg)  BMI 26.20 kg/m2  SpO2 96%  Body mass index is 26.2 kg/(m^2).   Repeat blood pressure 130/84   ASSESSMENT/ PLAN:   Diabetes type 2   The patient's diabetes control is still not improved with A1c consistently over 9% See history of present illness for detailed discussion of his current management, blood sugar patterns and problems identified  Again he has not brought his monitor for download and has not checked his glucose in a month Also has not been consistent with his diet especially when traveling Has not had any exercise, generally active only in summer Explained to the patient and his A1c is indicating average blood sugar is over 208 does need to check blood sugars more consistently especially after meals  Recommendations today:  Add  additional half milligram Amaryl in the morning  Invokana 300 mg daily, he can use 200 mg of the home supply  Given the Contour glucose monitor and discussed timing and frequency of glucose monitoring especially prior to his next visit  If he needs insulin he may need exemption 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

## 2015-04-19 NOTE — Patient Instructions (Addendum)
Invokana 2 pills in ams  Next Rx will be 300mg   Exercise daily  Add extra 1/2 Glimeperide in am  Check blood sugars on waking up 2-3  times a week Also check blood sugars about 2 hours after a meal and do this after different meals by rotation  Recommended blood sugar levels on waking up is 90-130 and about 2 hours after meal is 130-160  Please bring your blood sugar monitor to each visit, thank you

## 2015-04-20 LAB — LIPID PANEL
Cholesterol: 211 mg/dL — ABNORMAL HIGH (ref 0–200)
HDL: 45.5 mg/dL (ref >39.00)
LDL Cholesterol: 141 mg/dL — ABNORMAL HIGH (ref 0–99)
NONHDL: 165.39
Total CHOL/HDL Ratio: 5
Triglycerides: 121 mg/dL (ref 0.0–149.0)
VLDL: 24.2 mg/dL (ref 0.0–40.0)

## 2015-04-20 LAB — COMPREHENSIVE METABOLIC PANEL
ALK PHOS: 80 U/L (ref 39–117)
ALT: 25 U/L (ref 0–53)
AST: 16 U/L (ref 0–37)
Albumin: 4.2 g/dL (ref 3.5–5.2)
BUN: 14 mg/dL (ref 6–23)
CHLORIDE: 102 meq/L (ref 96–112)
CO2: 27 meq/L (ref 19–32)
CREATININE: 0.77 mg/dL (ref 0.40–1.50)
Calcium: 9.4 mg/dL (ref 8.4–10.5)
GFR: 109.7 mL/min (ref >60.00)
GLUCOSE: 131 mg/dL — AB (ref 70–99)
POTASSIUM: 3.9 meq/L (ref 3.5–5.1)
SODIUM: 138 meq/L (ref 135–145)
Total Bilirubin: 0.5 mg/dL (ref 0.2–1.2)
Total Protein: 6.9 g/dL (ref 6.0–8.3)

## 2015-04-20 NOTE — Progress Notes (Signed)
Quick Note:  Please let patient know that the cholesterol is too high, if he is taking his atorvastatin daily then we will need to change it to generic Crestor 40 mg daily ______

## 2015-05-17 ENCOUNTER — Other Ambulatory Visit: Payer: Self-pay | Admitting: *Deleted

## 2015-05-17 MED ORDER — ROSUVASTATIN CALCIUM 40 MG PO TABS: 40.0000 mg | ORAL_TABLET | Freq: Every day | ORAL | Status: DC

## 2015-05-17 MED ORDER — GLUCOSE BLOOD VI STRP: ORAL_STRIP | Status: DC

## 2015-05-19 ENCOUNTER — Telehealth: Payer: Self-pay | Admitting: Endocrinology

## 2015-05-19 NOTE — Telephone Encounter (Signed)
Patient stated he did not know which medication to take Rosuvastatin, Glimepiride, or Lipitor. and his [place] next meter is not working properly. please advise

## 2015-05-19 NOTE — Telephone Encounter (Signed)
I spoke with Mr. Mcconaughy, he was changed to Rosuvastatin from the atorvastatin.

## 2015-06-21 ENCOUNTER — Ambulatory Visit: Payer: BLUE CROSS/BLUE SHIELD | Admitting: Endocrinology

## 2015-07-01 ENCOUNTER — Other Ambulatory Visit: Payer: Self-pay | Admitting: Endocrinology

## 2015-08-06 ENCOUNTER — Other Ambulatory Visit: Payer: Self-pay | Admitting: Endocrinology

## 2015-08-12 DIAGNOSIS — R350 Frequency of micturition: Secondary | ICD-10-CM | POA: Diagnosis not present

## 2015-08-12 DIAGNOSIS — E782 Mixed hyperlipidemia: Secondary | ICD-10-CM | POA: Diagnosis not present

## 2015-08-12 DIAGNOSIS — E1165 Type 2 diabetes mellitus with hyperglycemia: Secondary | ICD-10-CM | POA: Diagnosis not present

## 2015-09-01 ENCOUNTER — Other Ambulatory Visit: Payer: Self-pay | Admitting: Endocrinology

## 2015-09-03 ENCOUNTER — Other Ambulatory Visit: Payer: Self-pay | Admitting: Endocrinology

## 2015-09-23 ENCOUNTER — Other Ambulatory Visit: Payer: Self-pay | Admitting: Endocrinology

## 2015-10-01 DIAGNOSIS — R350 Frequency of micturition: Secondary | ICD-10-CM | POA: Diagnosis not present

## 2015-10-01 DIAGNOSIS — Z125 Encounter for screening for malignant neoplasm of prostate: Secondary | ICD-10-CM | POA: Diagnosis not present

## 2015-10-01 DIAGNOSIS — E782 Mixed hyperlipidemia: Secondary | ICD-10-CM | POA: Diagnosis not present

## 2015-10-01 DIAGNOSIS — Z0001 Encounter for general adult medical examination with abnormal findings: Secondary | ICD-10-CM | POA: Diagnosis not present

## 2015-10-07 DIAGNOSIS — E782 Mixed hyperlipidemia: Secondary | ICD-10-CM | POA: Diagnosis not present

## 2015-10-07 DIAGNOSIS — N401 Enlarged prostate with lower urinary tract symptoms: Secondary | ICD-10-CM | POA: Diagnosis not present

## 2015-10-07 DIAGNOSIS — R350 Frequency of micturition: Secondary | ICD-10-CM | POA: Diagnosis not present

## 2015-10-07 DIAGNOSIS — E1165 Type 2 diabetes mellitus with hyperglycemia: Secondary | ICD-10-CM | POA: Diagnosis not present

## 2015-10-07 DIAGNOSIS — Z0001 Encounter for general adult medical examination with abnormal findings: Secondary | ICD-10-CM | POA: Diagnosis not present

## 2015-10-25 DIAGNOSIS — R350 Frequency of micturition: Secondary | ICD-10-CM | POA: Diagnosis not present

## 2015-10-25 DIAGNOSIS — E1165 Type 2 diabetes mellitus with hyperglycemia: Secondary | ICD-10-CM | POA: Diagnosis not present

## 2015-10-25 DIAGNOSIS — N401 Enlarged prostate with lower urinary tract symptoms: Secondary | ICD-10-CM | POA: Diagnosis not present

## 2015-10-25 DIAGNOSIS — E782 Mixed hyperlipidemia: Secondary | ICD-10-CM | POA: Diagnosis not present

## 2015-11-25 DIAGNOSIS — R350 Frequency of micturition: Secondary | ICD-10-CM | POA: Diagnosis not present

## 2015-11-25 DIAGNOSIS — E1165 Type 2 diabetes mellitus with hyperglycemia: Secondary | ICD-10-CM | POA: Diagnosis not present

## 2015-11-25 DIAGNOSIS — N401 Enlarged prostate with lower urinary tract symptoms: Secondary | ICD-10-CM | POA: Diagnosis not present

## 2016-07-03 DIAGNOSIS — E782 Mixed hyperlipidemia: Secondary | ICD-10-CM | POA: Diagnosis not present

## 2016-07-03 DIAGNOSIS — E291 Testicular hypofunction: Secondary | ICD-10-CM | POA: Diagnosis not present

## 2016-07-03 DIAGNOSIS — E1165 Type 2 diabetes mellitus with hyperglycemia: Secondary | ICD-10-CM | POA: Diagnosis not present

## 2016-07-03 DIAGNOSIS — N401 Enlarged prostate with lower urinary tract symptoms: Secondary | ICD-10-CM | POA: Diagnosis not present

## 2016-07-20 ENCOUNTER — Encounter: Payer: Self-pay | Admitting: Urology

## 2016-07-20 ENCOUNTER — Ambulatory Visit: Payer: BLUE CROSS/BLUE SHIELD | Admitting: Urology

## 2016-07-20 VITALS — BP 119/68 | HR 85 | Ht 74.0 in | Wt 188.8 lb

## 2016-07-20 DIAGNOSIS — N138 Other obstructive and reflux uropathy: Secondary | ICD-10-CM

## 2016-07-20 DIAGNOSIS — E291 Testicular hypofunction: Secondary | ICD-10-CM

## 2016-07-20 DIAGNOSIS — N529 Male erectile dysfunction, unspecified: Secondary | ICD-10-CM

## 2016-07-20 DIAGNOSIS — N401 Enlarged prostate with lower urinary tract symptoms: Secondary | ICD-10-CM | POA: Diagnosis not present

## 2016-07-20 DIAGNOSIS — N400 Enlarged prostate without lower urinary tract symptoms: Secondary | ICD-10-CM

## 2016-07-20 LAB — BLADDER SCAN AMB NON-IMAGING: SCAN RESULT: 393

## 2016-07-20 MED ORDER — TAMSULOSIN HCL 0.4 MG PO CAPS: 0.4000 mg | ORAL_CAPSULE | Freq: Every day | ORAL | 3 refills | Status: DC

## 2016-07-20 MED ORDER — FINASTERIDE 5 MG PO TABS: 5.0000 mg | ORAL_TABLET | Freq: Every day | ORAL | 3 refills | Status: DC

## 2016-07-20 NOTE — Progress Notes (Signed)
07/20/2016 4:36 PM   Ronelle Nigh [place] 30-May-1955 [id number]  Referring provider: Lavera Guise, MD [address] [place], [place] [address]  Chief Complaint  Patient presents with  . New Patient (Initial Visit)    BPH and low testosterone  referred by Dr. Chancy Milroy    HPI: Patient is a 61 year old Caucasian male who is referred by Dr. Chancy Milroy for BPH with LU TS, ED and hypogonadism.    BPH WITH LUTS His IPSS score today is 26, which is  lower urinary tract symptomatology.  He is terrible with his quality life due to his urinary symptoms.  His PVR is 393 mL.  His major complaints today are frequency, nocturia x 5, and intermittency.  He has had these symptoms since starting Invokana.  He denies any dysuria, hematuria or suprapubic pain.  He also denies any recent fevers, chills, nausea or vomiting.  He does not have a family history of PCa.     IPSS    Row Name 07/20/16 1500         International Prostate Symptom Score   How often have you had the sensation of not emptying your bladder? More than half the time     How often have you had to urinate less than every two hours? More than half the time     How often have you found you stopped and started again several times when you urinated? More than half the time     How often have you found it difficult to postpone urination? Almost always     How often have you had a weak urinary stream? Less than half the time     How often have you had to strain to start urination? Less than half the time     How many times did you typically get up at night to urinate? 5 Times     Total IPSS Score 26       Quality of Life due to urinary symptoms   If you were to spend the rest of your life with your urinary condition just the way it is now how would you feel about that? Terrible        Score:  1-7 Mild 8-19 Moderate 20-35 Severe   Erectile dysfunction His SHIM score is 9, which is moderate ED.  He has been having difficulty with  erections for two years.   His major complaint is maintaining erections.   His libido is preserved.  His risk factors for ED are age, BPH, DM, HTN and HLD.   He denies any painful erections or curvatures with his erections.   He is no longer having spontaneous erections.  He has tried Viagra in the past, but it made his heart race.     SHIM    Row Name 07/20/16 1557         SHIM: Over the last 6 months:   How do you rate your confidence that you could get and keep an erection? Very Low     When you had erections with sexual stimulation, how often were your erections hard enough for penetration (entering your partner)? Almost Never or Never     During sexual intercourse, how often were you able to maintain your erection after you had penetrated (entered) your partner? Extremely Difficult     During sexual intercourse, how difficult was it to maintain your erection to completion of intercourse? Extremely Difficult     When you attempted  sexual intercourse, how often was it satisfactory for you? Not Difficult       SHIM Total Score   SHIM 9        Score: 1-7 Severe ED 8-11 Moderate ED 12-16 Mild-Moderate ED 17-21 Mild ED 22-25 No ED  Hypogonadism Patient is experiencing a decrease in libido, a lack of energy, a decrease in strength, erections being less strong and falling asleep after dinner.   This is indicated by his responses to the ADAM questionnaire.  He is no longer having spontaneous erections at night.   He does not have sleep apnea.   He thinks he was on a gel in the past.       Androgen Deficiency in the Aging Male    Row Name 07/20/16 1500         Androgen Deficiency in the Aging Male   Do you have a decrease in libido (sex drive) Yes     Do you have lack of energy Yes     Do you have a decrease in strength and/or endurance Yes     Have you lost height No     Have you noticed a decreased "enjoyment of life" No     Are you sad and/or grumpy No     Are your erections  less strong Yes     Have you noticed a recent deterioration in your ability to play sports No     Are you falling asleep after dinner Yes     Has there been a recent deterioration in your work performance No        PMH: Past Medical History:  Diagnosis Date  . Basal cell carcinoma   . Diabetes [facility])     Surgical History: Past Surgical History:  Procedure Laterality Date  . HERNIA REPAIR     2003  . KNEE SURGERY     1998  . MOHS SURGERY     2003    Home Medications:  Allergies as of 07/20/2016   No Known Allergies     Medication List       Accurate as of 07/20/16  4:36 PM. Always use your most recent med list.          atorvastatin 40 MG tablet Commonly known as:  LIPITOR take 1 tablet by mouth once daily   Lancets 30G Misc Use to check blood sugars 5 times per day   BAYER MICROLET LANCETS lancets Use as instructed to check blood sugar 2 times per day dx code E11.65   Dulaglutide 1.5 MG/0.5ML Sopn Inject into the skin.   finasteride 5 MG tablet Commonly known as:  PROSCAR Take 1 tablet (5 mg total) by mouth daily.   glimepiride 2 MG tablet Commonly known as:  AMARYL take 1 tablet by mouth once daily BEFORE SUPPER   glucose blood test strip Commonly known as:  BAYER CONTOUR NEXT TEST Use as instructed to check blood sugar 3 times per day at various different times. dx code E11.65 Bring meter to every visit.   Insulin Pen Needle 32G X 5 MM Misc Commonly known as:  NOVOTWIST Use one per day to inject Victoza   INVOKANA 300 MG Tabs tablet Generic drug:  canagliflozin take 1 tablet by mouth once daily BEFORE BREAKFAST   metFORMIN 750 MG 24 hr tablet Commonly known as:  GLUCOPHAGE-XR Take 2 tablets (1,500 mg total) by mouth at bedtime.   rosuvastatin 40 MG tablet Commonly known as:  CRESTOR Take 1  tablet (40 mg total) by mouth daily.   tamsulosin 0.4 MG Caps capsule Commonly known as:  FLOMAX Take 1 capsule (0.4 mg total) by mouth daily.     VICTOZA 18 MG/3ML Sopn Generic drug:  liraglutide inject 0.2 milliliters subcutaneously once daily       Allergies: No Known Allergies  Family History: Family History  Problem Relation Age of Onset  . Heart disease Father   . Prostate cancer Neg Hx   . Kidney cancer Neg Hx   . Bladder Cancer Neg Hx     Social History:  reports that he has never smoked. He has never used smokeless tobacco. His alcohol and drug histories are not on file.  ROS: UROLOGY Frequent Urination?: Yes Hard to postpone urination?: No Burning/pain with urination?: No Get up at night to urinate?: Yes Leakage of urine?: No Urine stream starts and stops?: Yes Trouble starting stream?: No Do you have to strain to urinate?: No Blood in urine?: No Urinary tract infection?: No Sexually transmitted disease?: No Injury to kidneys or bladder?: No Painful intercourse?: No Weak stream?: No Erection problems?: Yes Penile pain?: No  Gastrointestinal Nausea?: No Vomiting?: No Indigestion/heartburn?: No Diarrhea?: No Constipation?: No  Constitutional Fever: No Night sweats?: No Weight loss?: No Fatigue?: No  Skin Skin rash/lesions?: No Itching?: No  Eyes Blurred vision?: No Double vision?: No  Ears/Nose/Throat Sore throat?: No Sinus problems?: No  Hematologic/Lymphatic Swollen glands?: No Easy bruising?: No  Cardiovascular Leg swelling?: No Chest pain?: No  Respiratory Cough?: No Shortness of breath?: No  Endocrine Excessive thirst?: Yes  Musculoskeletal Back pain?: No Joint pain?: No  Neurological Headaches?: No Dizziness?: No  Psychologic Depression?: No Anxiety?: No  Physical Exam: BP 119/68   Pulse 85   Ht 6\' 2"  (1.88 m)   Wt 188 lb 12.8 oz (85.6 kg)   BMI 24.24 kg/m   Constitutional: Well nourished. Alert and oriented, No acute distress. HEENT: [place] AT, moist mucus membranes. Trachea midline, no masses. Cardiovascular: No clubbing, cyanosis, or  edema. Respiratory: Normal respiratory effort, no increased work of breathing. GI: Abdomen is soft, non tender, non distended, no abdominal masses. Liver and spleen not palpable.  No hernias appreciated.  Stool sample for occult testing is not indicated.   GU: No CVA tenderness.  No bladder fullness or masses.  Patient with circumcised phallus.   Urethral meatus is patent.  No penile discharge. No penile lesions or rashes. Scrotum without lesions, cysts, rashes and/or edema.  Testicles are located scrotally bilaterally. No masses are appreciated in the testicles. Left and right epididymis are normal. Rectal: Patient with  normal sphincter tone. Anus and perineum without scarring or rashes. No rectal masses are appreciated. Prostate is approximately 55 grams, no nodules are appreciated. Seminal vesicles are normal. Skin: No rashes, bruises or suspicious lesions. Lymph: No cervical or inguinal adenopathy. Neurologic: Grossly intact, no focal deficits, moving all 4 extremities. Psychiatric: Normal mood and affect.  Laboratory Data:  Lab Results  Component Value Date   CREATININE 0.77 04/19/2015    Lab Results  Component Value Date   HGBA1C 9.3 04/19/2015        Component Value Date/Time   CHOL 211 (H) 04/19/2015 1706   HDL 45.50 04/19/2015 1706   CHOLHDL 5 04/19/2015 1706   VLDL 24.2 04/19/2015 1706   LDLCALC 141 (H) 04/19/2015 1706    Lab Results  Component Value Date   AST 16 04/19/2015   Lab Results  Component Value Date  ALT 25 04/19/2015     Pertinent Imaging: Results for MAJD, TISSUE (MRN [id number]) as of 07/20/2016 16:27  Ref. Range 07/20/2016 16:14  Scan Result Unknown [id number]    Assessment & Plan:    1. BPH with LUTS  - IPSS score is 26/6  - Continue conservative management, avoiding bladder irritants and timed voiding's  - most bothersome symptoms is frequency  - Initiate alpha-blocker (tamsulosin 0.4 mg), discussed side effects  - Initiate 5 alpha  reductase inhibitor (finasteride 5 mg), discussed side effects  - RTC in one month for IPS'S and PVR   - BLADDER SCAN AMB NON-IMAGING  2. Erectile dysfunction  - SHIM score is 9  - I explained to the patient that in order to achieve an erection it takes good functioning of the nervous system (parasympathetic, sympathetic, sensory and motor), good blood flow into the erectile tissue of the penis and a desire to have sex  - I explained that conditions like diabetes, hypertension, coronary artery disease, peripheral vascular disease, smoking, alcohol consumption, age, sleep apnea and BPH can diminish the ability to have an erection  - We discussed trying a different PDE5 inhibitor, intra-urethral suppositories, intracavernous vasoactive drug injection therapy, vacuum constriction device and penile prosthesis implantation  - He would like to try another PDE5 inhibitor - given Stendra 200 mg, # 2 samples given - Take the medication two hours prior to intercourse on an empty stomach.  He is warned not to take the  medications that contain nitrates.  I also advised him of the side effects, such as: headache, flushing, dyspepsia, abnormal vision, nasal congestion, back pain, myalgia, nausea, dizziness, and rash.  - RTC in one month for repeat SHIM score and exam   3. Hypogonadism  - I explained to patient that the current recommendations from the Endocrine Society reports the diagnosis of hypogonadism requires a serum total testosterone level obtained between 8 and 10 AM at least 2 days apart that is below the laboratory parameters  for normal testosterone.     - At this time, the patient does not meet this requirement.  He will return for two morning serum testosterones, two days apart before 10 AM     Return in about 1 month (around 08/20/2016) for IPSS, SHIM and PVR.  These notes generated with voice recognition software. I apologize for typographical errors.  Zara Council, [facility] [address] [place], [place] [address] [phone]

## 2016-07-27 ENCOUNTER — Other Ambulatory Visit: Payer: Self-pay

## 2016-07-27 DIAGNOSIS — E291 Testicular hypofunction: Secondary | ICD-10-CM

## 2016-07-28 ENCOUNTER — Other Ambulatory Visit: Payer: BLUE CROSS/BLUE SHIELD

## 2016-07-28 DIAGNOSIS — E291 Testicular hypofunction: Secondary | ICD-10-CM

## 2016-07-29 LAB — TESTOSTERONE: Testosterone: 373 ng/dL (ref 264–916)

## 2016-07-31 ENCOUNTER — Telehealth: Payer: Self-pay

## 2016-07-31 NOTE — Telephone Encounter (Signed)
Spoke with pt in reference to lab results. Pt voiced understanding.  

## 2016-07-31 NOTE — Telephone Encounter (Signed)
-----   Message from Nori Riis, PA-C sent at 07/30/2016  8:38 PM EDT ----- Please notify the patient that his testosterone is within normal limits.

## 2016-08-16 NOTE — Progress Notes (Signed)
08/17/2016 4:45 PM   Michael Gross 04-20-1956 [id number]  Referring provider: Lavera Guise, MD [address] [place], [place] [address]  Chief Complaint  Patient presents with  . Benign Prostatic Hypertrophy    1 month follow up   . Erectile Dysfunction    HPI: Patient is a 61 year old Caucasian male who presents for a one month follow up after initiating tamsulosin and finasteride for BPH with LUTS and Stendra for erectile dysfunction.      BPH WITH LUTS His IPSS score today is 10, which is moderate lower urinary tract symptomatology.  He is unhappy with his quality life due to his urinary symptoms.  His PVR is 458 mL.  His previous I PSS score was 26/6.  His previous PVR was 393 mL.  His major complaints today are frequency, nocturia x 5, and intermittency.  He has had these symptoms since starting Invokana.  He denies any dysuria, hematuria or suprapubic pain.  He also denies any recent fevers, chills, nausea or vomiting.  He does not have a family history of PCa.  He has found the tamsulosin and finasteride somewhat helpful.  He feels is is emptying better, but he is still having nocturia.       IPSS    Row Name 07/20/16 1500 08/17/16 1600       International Prostate Symptom Score   How often have you had the sensation of not emptying your bladder? More than half the time Less than 1 in 5    How often have you had to urinate less than every two hours? More than half the time About half the time    How often have you found you stopped and started again several times when you urinated? More than half the time Less than 1 in 5 times    How often have you found it difficult to postpone urination? Almost always Less than half the time    How often have you had a weak urinary stream? Less than half the time Not at All    How often have you had to strain to start urination? Less than half the time Not at All    How many times did you typically get up at night to urinate? 5  Times 3 Times    Total IPSS Score 26 10      Quality of Life due to urinary symptoms   If you were to spend the rest of your life with your urinary condition just the way it is now how would you feel about that? Terrible Unhappy       Score:  1-7 Mild 8-19 Moderate 20-35 Severe   Erectile dysfunction His SHIM score is 10, which is moderate ED.  His previous SHIM was 9.  He has been having difficulty with erections for two years.   His major complaint is maintaining erections.   His libido is preserved.  His risk factors for ED are age, BPH, DM, HTN and HLD.   He denies any painful erections or curvatures with his erections.   He is no longer having spontaneous erections.  He has tried Viagra in the past, but it made his heart race.  He did not find the Stendra samples effective.       SHIM    Row Name 07/20/16 1557 08/17/16 1621       SHIM: Over the last 6 months:   How do you rate your confidence that you could  get and keep an erection? Very Low Very Low    When you had erections with sexual stimulation, how often were your erections hard enough for penetration (entering your partner)? Almost Never or Never Almost Never or Never    During sexual intercourse, how often were you able to maintain your erection after you had penetrated (entered) your partner? Extremely Difficult Almost Never or Never    During sexual intercourse, how difficult was it to maintain your erection to completion of intercourse? Extremely Difficult Difficult    When you attempted sexual intercourse, how often was it satisfactory for you? Not Difficult Most Times (much more than half the time)      SHIM Total Score   SHIM 9 10       Score: 1-7 Severe ED 8-11 Moderate ED 12-16 Mild-Moderate ED 17-21 Mild ED 22-25 No ED  Hypogonadism Patient is experiencing a decrease in libido, a lack of energy, a decrease in strength, erections being less strong and falling asleep after dinner.   This is indicated by  his responses to the ADAM questionnaire.  He is no longer having spontaneous erections at night.   He does not have sleep apnea.   He thinks he was on a gel in the past.       Androgen Deficiency in the Aging Male    Row Name 07/20/16 1500         Androgen Deficiency in the Aging Male   Do you have a decrease in libido (sex drive) Yes     Do you have lack of energy Yes     Do you have a decrease in strength and/or endurance Yes     Have you lost height No     Have you noticed a decreased "enjoyment of life" No     Are you sad and/or grumpy No     Are your erections less strong Yes     Have you noticed a recent deterioration in your ability to play sports No     Are you falling asleep after dinner Yes     Has there been a recent deterioration in your work performance No        PMH: Past Medical History:  Diagnosis Date  . Basal cell carcinoma   . Diabetes [facility])     Surgical History: Past Surgical History:  Procedure Laterality Date  . HERNIA REPAIR     2003  . KNEE SURGERY     1998  . MOHS SURGERY     2003    Home Medications:  Allergies as of 08/17/2016   No Known Allergies     Medication List       Accurate as of 08/17/16  4:45 PM. Always use your most recent med list.          atorvastatin 40 MG tablet Commonly known as:  LIPITOR take 1 tablet by mouth once daily   Lancets 30G Misc Use to check blood sugars 5 times per day   BAYER MICROLET LANCETS lancets Use as instructed to check blood sugar 2 times per day dx code E11.65   Dulaglutide 1.5 MG/0.5ML Sopn Inject into the skin.   finasteride 5 MG tablet Commonly known as:  PROSCAR Take 1 tablet (5 mg total) by mouth daily.   glimepiride 2 MG tablet Commonly known as:  AMARYL take 1 tablet by mouth once daily BEFORE SUPPER   glucose blood test strip Commonly known as:  BAYER CONTOUR NEXT TEST  Use as instructed to check blood sugar 3 times per day at various different times. dx code E11.65  Bring meter to every visit.   Insulin Pen Needle 32G X 5 MM Misc Commonly known as:  NOVOTWIST Use one per day to inject Victoza   INVOKANA 300 MG Tabs tablet Generic drug:  canagliflozin take 1 tablet by mouth once daily BEFORE BREAKFAST   metFORMIN 750 MG 24 hr tablet Commonly known as:  GLUCOPHAGE-XR Take 2 tablets (1,500 mg total) by mouth at bedtime.   rosuvastatin 40 MG tablet Commonly known as:  CRESTOR Take 1 tablet (40 mg total) by mouth daily.   tamsulosin 0.4 MG Caps capsule Commonly known as:  FLOMAX Take 1 capsule (0.4 mg total) by mouth daily.   VICTOZA 18 MG/3ML Sopn Generic drug:  liraglutide inject 0.2 milliliters subcutaneously once daily       Allergies: No Known Allergies  Family History: Family History  Problem Relation Age of Onset  . Heart disease Father   . Prostate cancer Neg Hx   . Kidney cancer Neg Hx   . Bladder Cancer Neg Hx     Social History:  reports that he has never smoked. He has never used smokeless tobacco. He reports that he drinks alcohol. He reports that he does not use drugs.  ROS: UROLOGY Frequent Urination?: Yes Hard to postpone urination?: No Burning/pain with urination?: No Get up at night to urinate?: Yes Leakage of urine?: No Urine stream starts and stops?: No Trouble starting stream?: No Do you have to strain to urinate?: No Blood in urine?: No Urinary tract infection?: No Sexually transmitted disease?: No Injury to kidneys or bladder?: No Painful intercourse?: No Weak stream?: No Erection problems?: Yes Penile pain?: No  Gastrointestinal Nausea?: No Vomiting?: No Indigestion/heartburn?: No Diarrhea?: No Constipation?: No  Constitutional Fever: No Night sweats?: No Weight loss?: No Fatigue?: No  Skin Skin rash/lesions?: No Itching?: No  Eyes Blurred vision?: No Double vision?: No  Ears/Nose/Throat Sore throat?: No Sinus problems?: No  Hematologic/Lymphatic Swollen glands?: No Easy  bruising?: No  Cardiovascular Leg swelling?: No Chest pain?: No  Respiratory Cough?: No Shortness of breath?: No  Endocrine Excessive thirst?: Yes  Musculoskeletal Back pain?: No Joint pain?: No  Neurological Headaches?: No Dizziness?: No  Psychologic Depression?: No Anxiety?: No  Physical Exam: BP (!) 146/87   Pulse 90   Ht 6\' 3"  (1.905 m)   Wt 188 lb 4.8 oz (85.4 kg)   BMI 23.54 kg/m   Constitutional: Well nourished. Alert and oriented, No acute distress. HEENT: [place] AT, moist mucus membranes. Trachea midline, no masses. Cardiovascular: No clubbing, cyanosis, or edema. Respiratory: Normal respiratory effort, no increased work of breathing. Skin: No rashes, bruises or suspicious lesions. Lymph: No cervical or inguinal adenopathy. Neurologic: Grossly intact, no focal deficits, moving all 4 extremities. Psychiatric: Normal mood and affect.  Laboratory Data:  Lab Results  Component Value Date   CREATININE 0.77 04/19/2015    Lab Results  Component Value Date   HGBA1C 9.3 04/19/2015        Component Value Date/Time   CHOL 211 (H) 04/19/2015 1706   HDL 45.50 04/19/2015 1706   CHOLHDL 5 04/19/2015 1706   VLDL 24.2 04/19/2015 1706   LDLCALC 141 (H) 04/19/2015 1706    Lab Results  Component Value Date   AST 16 04/19/2015   Lab Results  Component Value Date   ALT 25 04/19/2015     Pertinent Imaging: Results for Meckling, GAGAN  Darnell Level (MRN [id number]) as of 08/17/2016 16:55  Ref. Range 08/17/2016 16:45  Scan Result Unknown 458     Assessment & Plan:    1. BPH with LUTS  - IPSS score is 10/5, it is improving  - Continue conservative management, avoiding bladder irritants and timed voiding's  - most bothersome symptoms is frequency  - Continue tamsulosin 0.4 mg and finasteride 5 mg  - Discussed undergoing cystoscopy to evaluate for BOO or learn CIC - patient does not want to pursue either option at this time  - Initiate Cialis 5 mg daily; # 30  samples given   - RTC in one month for I PSS and PVR   - BLADDER SCAN AMB NON-IMAGING  2. Erectile dysfunction  - SHIM score is 10, it is improving  - Stendra not effective  - start Cialis 5 mg daily, #30 samples given  - RTC in one month for repeat SHIM score and exam   3. Hypogonadism  - Morning testosterone found to be within normal limits, patient does not have hypogonadism.   Return in about 1 month (around 09/16/2016) for IPSS and PVR.  These notes generated with voice recognition software. I apologize for typographical errors.  Zara Council, [facility] [address] [place], [place] [address] [phone]

## 2016-08-17 ENCOUNTER — Ambulatory Visit (INDEPENDENT_AMBULATORY_CARE_PROVIDER_SITE_OTHER): Payer: BLUE CROSS/BLUE SHIELD | Admitting: Urology

## 2016-08-17 ENCOUNTER — Encounter: Payer: Self-pay | Admitting: Urology

## 2016-08-17 VITALS — BP 146/87 | HR 90 | Ht 75.0 in | Wt 188.3 lb

## 2016-08-17 DIAGNOSIS — N138 Other obstructive and reflux uropathy: Secondary | ICD-10-CM

## 2016-08-17 DIAGNOSIS — N400 Enlarged prostate without lower urinary tract symptoms: Secondary | ICD-10-CM

## 2016-08-17 DIAGNOSIS — N529 Male erectile dysfunction, unspecified: Secondary | ICD-10-CM | POA: Diagnosis not present

## 2016-08-17 DIAGNOSIS — N401 Enlarged prostate with lower urinary tract symptoms: Secondary | ICD-10-CM

## 2016-08-17 LAB — BLADDER SCAN AMB NON-IMAGING: SCAN RESULT: 458

## 2016-09-13 NOTE — Progress Notes (Signed)
09/14/2016 8:48 PM   Michael Gross [birth year] [id number]  Referring provider: Lavera Guise, [place], [place] [address]  Chief Complaint  Patient presents with  . Benign Prostatic Hypertrophy    1 month follow up   . Erectile Dysfunction    HPI: Patient is a 61 year old Caucasian male who presents for a one month follow up after initiating tamsulosin and finasteride for BPH with LUTS and adding Cialis 5 mg daily for erectile dysfunction.      BPH WITH LUTS His IPSS score today is 14, which is moderate lower urinary tract symptomatology.  He is mostly dissatisfied with his quality life due to his urinary symptoms.  His PVR is 114 mL.  His previous I PSS score was 10/5.  His previous PVR was 458 mL.  His major complaints today are frequency and nocturia x 5.  He has had these symptoms since starting Invokana.  He denies any dysuria, hematuria or suprapubic pain.  He also denies any recent fevers, chills, nausea or vomiting.  He does not have a family history of PCa.  He has found the tamsulosin and finasteride somewhat helpful.  He feels is is emptying better, but he is still having nocturia.        IPSS    Row Name 07/20/16 1500 08/17/16 1600 09/14/16 1600     International Prostate Symptom Score   How often have you had the sensation of not emptying your bladder? More than half the time Less than 1 in 5 Less than half the time   How often have you had to urinate less than every two hours? More than half the time About half the time About half the time   How often have you found you stopped and started again several times when you urinated? More than half the time Less than 1 in 5 times Less than 1 in 5 times   How often have you found it difficult to postpone urination? Almost always Less than half the time About half the time   How often have you had a weak urinary stream? Less than half the time Not at All Not at All   How often have you had to strain to  start urination? Less than half the time Not at All Not at All   How many times did you typically get up at night to urinate? 5 Times 3 Times 5 Times   Total IPSS Score 26 10 14      Quality of Life due to urinary symptoms   If you were to spend the rest of your life with your urinary condition just the way it is now how would you feel about that? Terrible Unhappy Mostly Disatisfied      Score:  1-7 Mild 8-19 Moderate 20-35 Severe   Erectile dysfunction His SHIM score is 8, which is moderate ED.  His previous SHIM was 10.  He has been having difficulty with erections for two years.   His major complaint is maintaining erections.   His libido is preserved.  His risk factors for ED are age, BPH, DM, HTN and HLD.   He denies any painful erections or curvatures with his erections.   He is no longer having spontaneous erections.  He has tried Viagra in the past, but it made his heart race.  He did not find the Stendra samples effective or the daily 5 mg Cialis.       SHIM  [place] Name 07/20/16 1557 08/17/16 1621 09/14/16 1628     SHIM: Over the last 6 months:   How do you rate your confidence that you could get and keep an erection? Very Low Very Low Very Low   When you had erections with sexual stimulation, how often were your erections hard enough for penetration (entering your partner)? Almost Never or Never Almost Never or Never Almost Never or Never   During sexual intercourse, how often were you able to maintain your erection after you had penetrated (entered) your partner? Extremely Difficult Almost Never or Never Almost Never or Never   During sexual intercourse, how difficult was it to maintain your erection to completion of intercourse? Extremely Difficult Difficult Extremely Difficult   When you attempted sexual intercourse, how often was it satisfactory for you? Not Difficult Most Times (much more than half the time) Most Times (much more than half the time)     SHIM Total Score    SHIM 9 10 8       Score: 1-7 Severe ED 8-11 Moderate ED 12-16 Mild-Moderate ED 17-21 Mild ED 22-25 No ED    PMH: Past Medical History:  Diagnosis Date  . Basal cell carcinoma   . BPH (benign prostatic hyperplasia)   . Diabetes [facility])     Surgical History: Past Surgical History:  Procedure Laterality Date  . HERNIA REPAIR     2003  . KNEE SURGERY     1998  . MOHS SURGERY     2003    Home Medications:  Allergies as of 09/14/2016   No Known Allergies     Medication List       Accurate as of 09/14/16  8:48 PM. Always use your most recent med list.          atorvastatin 40 MG tablet Commonly known as:  LIPITOR take 1 tablet by mouth once daily   Lancets 30G Misc Use to check blood sugars 5 times per day   BAYER MICROLET LANCETS lancets Use as instructed to check blood sugar 2 times per day dx code E11.65   Dulaglutide 1.5 MG/0.5ML Sopn Inject into the skin.   finasteride 5 MG tablet Commonly known as:  PROSCAR Take 1 tablet (5 mg total) by mouth daily.   glimepiride 2 MG tablet Commonly known as:  AMARYL take 1 tablet by mouth once daily BEFORE SUPPER   glucose blood test strip Commonly known as:  BAYER CONTOUR NEXT TEST Use as instructed to check blood sugar 3 times per day at various different times. dx code E11.65 Bring meter to every visit.   Insulin Pen Needle 32G X 5 MM Misc Commonly known as:  NOVOTWIST Use one per day to inject Victoza   INVOKANA 300 MG Tabs tablet Generic drug:  canagliflozin take 1 tablet by mouth once daily BEFORE BREAKFAST   metFORMIN 750 MG 24 hr tablet Commonly known as:  GLUCOPHAGE-XR Take 2 tablets (1,500 mg total) by mouth at bedtime.   rosuvastatin 40 MG tablet Commonly known as:  CRESTOR Take 1 tablet (40 mg total) by mouth daily.   tamsulosin 0.4 MG Caps capsule Commonly known as:  FLOMAX Take 1 capsule (0.4 mg total) by mouth daily.   VICTOZA 18 MG/3ML Sopn Generic drug:  liraglutide inject 0.2  milliliters subcutaneously once daily       Allergies: No Known Allergies  Family History: Family History  Problem Relation Age of Onset  . Heart disease Father   . Prostate  cancer Neg Hx   . Kidney cancer Neg Hx   . Bladder Cancer Neg Hx     Social History:  reports that he has never smoked. He has never used smokeless tobacco. He reports that he drinks alcohol. He reports that he does not use drugs.  ROS: UROLOGY Frequent Urination?: Yes Hard to postpone urination?: No Burning/pain with urination?: No Get up at night to urinate?: Yes Leakage of urine?: No Urine stream starts and stops?: No Trouble starting stream?: No Do you have to strain to urinate?: No Blood in urine?: No Urinary tract infection?: No Sexually transmitted disease?: No Injury to kidneys or bladder?: No Painful intercourse?: No Weak stream?: No Erection problems?: Yes Penile pain?: No  Gastrointestinal Nausea?: No Vomiting?: No Indigestion/heartburn?: No Diarrhea?: No Constipation?: No  Constitutional Fever: No Night sweats?: No Weight loss?: No Fatigue?: No  Skin Skin rash/lesions?: No Itching?: No  Eyes Blurred vision?: No Double vision?: No  Ears/Nose/Throat Sore throat?: No Sinus problems?: No  Hematologic/Lymphatic Swollen glands?: No Easy bruising?: No  Cardiovascular Leg swelling?: No Chest pain?: No  Respiratory Cough?: No Shortness of breath?: No  Endocrine Excessive thirst?: No  Musculoskeletal Back pain?: No Joint pain?: No  Neurological Headaches?: No Dizziness?: No  Psychologic Depression?: No Anxiety?: No  Physical Exam: BP 114/73   Pulse 98   Ht 6\' 3"  (1.905 m)   Wt 192 lb (87.1 kg)   BMI 24.00 kg/m   Constitutional: Well nourished. Alert and oriented, No acute distress. HEENT: [place] AT, moist mucus membranes. Trachea midline, no masses. Cardiovascular: No clubbing, cyanosis, or edema. Respiratory: Normal respiratory effort, no  increased work of breathing. Skin: No rashes, bruises or suspicious lesions. Lymph: No cervical or inguinal adenopathy. Neurologic: Grossly intact, no focal deficits, moving all 4 extremities. Psychiatric: Normal mood and affect.  Laboratory Data: PSA History  2.8 in 09/2015  Lab Results  Component Value Date   CREATININE 0.77 04/19/2015    Lab Results  Component Value Date   HGBA1C 9.3 04/19/2015        Component Value Date/Time   CHOL 211 (H) 04/19/2015 1706   HDL 45.50 04/19/2015 1706   CHOLHDL 5 04/19/2015 1706   VLDL 24.2 04/19/2015 1706   LDLCALC 141 (H) 04/19/2015 1706    Lab Results  Component Value Date   AST 16 04/19/2015   Lab Results  Component Value Date   ALT 25 04/19/2015     Pertinent Imaging: Results for ZEVEN, KOCAK (MRN [id number]) as of 09/14/2016 20:45  Ref. Range 09/14/2016 16:32  Scan Result Unknown 114   Assessment & Plan:    1. BPH with LUTS  - IPSS score is 14/4, it is worsening  - Continue conservative management, avoiding bladder irritants and timed voiding's  - most bothersome symptoms is frequency and nocturia  - Continue tamsulosin 0.4 mg and finasteride 5 mg  - Discussed undergoing cystoscopy to evaluate for BOO or learn CIC - patient does not want to pursue either option at this time  - Discontinue Cialis 5 mg daily  - BLADDER SCAN AMB NON-IMAGING  - PSA screening by PCP  2. Erectile dysfunction  - SHIM score is 8, it is worse  - Stendra not effective  - Cialis 5 mg daily not effective  - RTC for Trimix titration  Return for RTC for Trimix titration.  These notes generated with voice recognition software. I apologize for typographical errors.  Zara Council, [facility] [address]  [address], [place], [place] [address] [phone]

## 2016-09-14 ENCOUNTER — Telehealth: Payer: Self-pay | Admitting: Urology

## 2016-09-14 ENCOUNTER — Ambulatory Visit: Payer: BLUE CROSS/BLUE SHIELD | Admitting: Urology

## 2016-09-14 ENCOUNTER — Encounter: Payer: Self-pay | Admitting: Urology

## 2016-09-14 VITALS — BP 114/73 | HR 98 | Ht 75.0 in | Wt 192.0 lb

## 2016-09-14 DIAGNOSIS — N401 Enlarged prostate with lower urinary tract symptoms: Secondary | ICD-10-CM

## 2016-09-14 DIAGNOSIS — N400 Enlarged prostate without lower urinary tract symptoms: Secondary | ICD-10-CM | POA: Diagnosis not present

## 2016-09-14 DIAGNOSIS — N529 Male erectile dysfunction, unspecified: Secondary | ICD-10-CM | POA: Diagnosis not present

## 2016-09-14 DIAGNOSIS — N138 Other obstructive and reflux uropathy: Secondary | ICD-10-CM

## 2016-09-14 LAB — BLADDER SCAN AMB NON-IMAGING: Scan Result: 114

## 2016-09-14 NOTE — Telephone Encounter (Signed)
Would you call in the test vial of Trimix (3 mL) for this patient?  Please let the pharmacy know and for [place] too, he is an over the road truck driver.

## 2016-09-19 NOTE — Telephone Encounter (Signed)
Trimix was called into [facility]. Made aware of pt job situation.

## 2016-09-22 ENCOUNTER — Ambulatory Visit: Payer: BLUE CROSS/BLUE SHIELD | Admitting: Urology

## 2016-09-29 DIAGNOSIS — Z0001 Encounter for general adult medical examination with abnormal findings: Secondary | ICD-10-CM | POA: Diagnosis not present

## 2016-09-29 DIAGNOSIS — E782 Mixed hyperlipidemia: Secondary | ICD-10-CM | POA: Diagnosis not present

## 2016-09-29 DIAGNOSIS — Z125 Encounter for screening for malignant neoplasm of prostate: Secondary | ICD-10-CM | POA: Diagnosis not present

## 2016-09-29 DIAGNOSIS — E1165 Type 2 diabetes mellitus with hyperglycemia: Secondary | ICD-10-CM | POA: Diagnosis not present

## 2016-10-07 DIAGNOSIS — N401 Enlarged prostate with lower urinary tract symptoms: Secondary | ICD-10-CM | POA: Diagnosis not present

## 2016-10-07 DIAGNOSIS — E291 Testicular hypofunction: Secondary | ICD-10-CM | POA: Diagnosis not present

## 2016-10-07 DIAGNOSIS — Z0001 Encounter for general adult medical examination with abnormal findings: Secondary | ICD-10-CM | POA: Diagnosis not present

## 2016-10-07 DIAGNOSIS — R350 Frequency of micturition: Secondary | ICD-10-CM | POA: Diagnosis not present

## 2016-10-09 DIAGNOSIS — E782 Mixed hyperlipidemia: Secondary | ICD-10-CM | POA: Diagnosis not present

## 2016-10-09 DIAGNOSIS — E1165 Type 2 diabetes mellitus with hyperglycemia: Secondary | ICD-10-CM | POA: Diagnosis not present

## 2016-10-26 NOTE — Progress Notes (Signed)
This encounter was created in error - please disregard.

## 2016-10-27 ENCOUNTER — Encounter: Payer: BLUE CROSS/BLUE SHIELD | Admitting: Urology

## 2016-10-27 NOTE — Progress Notes (Signed)
Patient had to reschedule appointment. Never went to get medication. Pharmacy called him but he didn't know what it was, so he never went and got medication. Patient sent to front to reschedule for [place].

## 2016-11-08 NOTE — Progress Notes (Signed)
11/09/2016 10:09 AM   Michael Gross 1955/09/24 [id number]  Referring provider: Lavera Guise, [address] [place], [place] [address]  Chief Complaint  Patient presents with  . Erectile Dysfunction    HPI: 61 yo WM with BPH with LU TS and ED who presents today for a Trimix titration.  Erectile dysfunction His SHIM score is 8, which is moderate ED.  His previous SHIM was 10.  He has been having difficulty with erections for two years.   His major complaint is maintaining erections.   His libido is preserved.  His risk factors for ED are age, BPH, DM, HTN and HLD.   He denies any painful erections or curvatures with his erections.   He is no longer having spontaneous erections.  He has tried Viagra in the past, but it made his heart race.  He did not find the Stendra samples effective or the daily 5 mg Cialis.  He presents today for a Trimix titration.      [employer] Name 09/14/16 1628         SHIM: Over the last 6 months:   How do you rate your confidence that you could get and keep an erection? Very Low     When you had erections with sexual stimulation, how often were your erections hard enough for penetration (entering your partner)? Almost Never or Never     During sexual intercourse, how often were you able to maintain your erection after you had penetrated (entered) your partner? Almost Never or Never     During sexual intercourse, how difficult was it to maintain your erection to completion of intercourse? Extremely Difficult     When you attempted sexual intercourse, how often was it satisfactory for you? Most Times (much more than half the time)       SHIM Total Score   SHIM 8        Score: 1-7 Severe ED 8-11 Moderate ED 12-16 Mild-Moderate ED 17-21 Mild ED 22-25 No ED  Procedure reviewed and questions answered.      PMH: Past Medical History:  Diagnosis Date  . Basal cell carcinoma   . BPH (benign prostatic hyperplasia)   . Diabetes [facility])      Surgical History: Past Surgical History:  Procedure Laterality Date  . HERNIA REPAIR     2003  . KNEE SURGERY     1998  . MOHS SURGERY     2003    Home Medications:  Allergies as of 11/09/2016   No Known Allergies     Medication List       Accurate as of 11/09/16 10:09 AM. Always use your most recent med list.          atorvastatin 40 MG tablet Commonly known as:  LIPITOR take 1 tablet by mouth once daily   Lancets 30G Misc Use to check blood sugars 5 times per day   BAYER MICROLET LANCETS lancets Use as instructed to check blood sugar 2 times per day dx code E11.65   Dulaglutide 1.5 MG/0.5ML Sopn Inject into the skin.   finasteride 5 MG tablet Commonly known as:  PROSCAR Take 1 tablet (5 mg total) by mouth daily.   glimepiride 2 MG tablet Commonly known as:  AMARYL take 1 tablet by mouth once daily BEFORE SUPPER   glucose blood test strip Commonly known as:  BAYER CONTOUR NEXT TEST Use as instructed to check blood sugar 3 times per  day at various different times. dx code E11.65 Bring meter to every visit.   Insulin Pen Needle 32G X 5 MM Misc Commonly known as:  NOVOTWIST Use one per day to inject Victoza   INVOKANA 300 MG Tabs tablet Generic drug:  canagliflozin take 1 tablet by mouth once daily BEFORE BREAKFAST   metFORMIN 750 MG 24 hr tablet Commonly known as:  GLUCOPHAGE-XR Take 2 tablets (1,500 mg total) by mouth at bedtime.   rosuvastatin 40 MG tablet Commonly known as:  CRESTOR Take 1 tablet (40 mg total) by mouth daily.   tamsulosin 0.4 MG Caps capsule Commonly known as:  FLOMAX Take 1 capsule (0.4 mg total) by mouth daily.   VICTOZA 18 MG/3ML Sopn Generic drug:  liraglutide inject 0.2 milliliters subcutaneously once daily       Allergies: No Known Allergies  Family History: Family History  Problem Relation Age of Onset  . Heart disease Father   . Prostate cancer Neg Hx   . Kidney cancer Neg Hx   . Bladder Cancer Neg Hx      Social History:  reports that he has never smoked. He has never used smokeless tobacco. He reports that he drinks alcohol. He reports that he does not use drugs.  ROS: UROLOGY Frequent Urination?: Yes Hard to postpone urination?: No Burning/pain with urination?: No Get up at night to urinate?: Yes Leakage of urine?: No Urine stream starts and stops?: No Trouble starting stream?: No Do you have to strain to urinate?: No Blood in urine?: No Urinary tract infection?: No Sexually transmitted disease?: No Injury to kidneys or bladder?: No Painful intercourse?: No Weak stream?: No Erection problems?: Yes Penile pain?: No  Gastrointestinal Nausea?: No Vomiting?: No Indigestion/heartburn?: No Diarrhea?: No Constipation?: No  Constitutional Fever: No Night sweats?: No Weight loss?: No Fatigue?: No  Skin Skin rash/lesions?: No Itching?: No  Eyes Blurred vision?: No Double vision?: No  Ears/Nose/Throat Sore throat?: No Sinus problems?: No  Hematologic/Lymphatic Swollen glands?: No Easy bruising?: No  Cardiovascular Leg swelling?: No Chest pain?: No  Respiratory Cough?: No Shortness of breath?: No  Endocrine Excessive thirst?: No  Musculoskeletal Back pain?: No Joint pain?: No  Neurological Headaches?: No Dizziness?: No  Psychologic Depression?: No Anxiety?: No  Physical Exam: BP 117/67 (BP Location: Left Arm, Patient Position: Sitting, Cuff Size: Normal)   Pulse (!) 108   Ht 6\' 3"  (1.905 m)   Wt 192 lb 14.4 oz (87.5 kg)   BMI 24.11 kg/m   Constitutional: Well nourished. Alert and oriented, No acute distress. HEENT: [place] AT, moist mucus membranes. Trachea midline, no masses. Cardiovascular: No clubbing, cyanosis, or edema. Respiratory: Normal respiratory effort, no increased work of breathing. GI: Abdomen is soft, non tender, non distended, no abdominal masses. Liver and spleen not palpable.  No hernias appreciated.  Stool sample for  occult testing is not indicated.   GU: No CVA tenderness.  No bladder fullness or masses.  Patient with circumcised phallus.   Urethral meatus is patent.  No penile discharge. No penile lesions or rashes.  Rectal: Not preformed.   Skin: No rashes, bruises or suspicious lesions. Lymph: No cervical or inguinal adenopathy. Neurologic: Grossly intact, no focal deficits, moving all 4 extremities. Psychiatric: Normal mood and affect.   Laboratory Data: PSA History  2.8 in 09/2015  I have reviewed the labs.  Procedure Patient's left corpus cavernosum is identified.  An area near the base of the penis is cleansed with rubbing alcohol.  Careful to  avoid the dorsal vein, 1 mcg of Trimix (papaverine HCL 30 mg, phentolamine mesylate 1 mg, prostaglandin 10 mcg) LOT # [id number] EXP: 11/04/2016 is injected at a 90 degree angle into the left corpus cavernosum near the base of the penis.  Patient experienced a semi firm erection in 15 minutes.  1 mcg of Trimix is injected into his right corpus cavernosum.   He experienced more of an erection in 15 minutes, but it was not firm enough for penetration.  1 mcg of Trimix is then injected into the left corpus cavernosum.  He is experiencing a firmer erection.    Assessment & Plan:    1. Erectile dysfunction  - SHIM score is 8, it is worse  - failed PDE5-inhibitors  - 3 mcg in total of Trimix is injected - he will report his experience - patient feels comfortable with self injection and will inject 4 mcg with his next trial  - Advised patient of the condition of priapism, painful erection lasting for more than four hours, and to contact the office immediately or seek treatment in the ED   Return in about 1 month (around 12/10/2016) for SHIM and exam.  These notes generated with voice recognition software. I apologize for typographical errors.  Zara Council, [facility] [address] [place], [place]  [address] [phone]

## 2016-11-09 ENCOUNTER — Ambulatory Visit: Payer: BLUE CROSS/BLUE SHIELD | Admitting: Urology

## 2016-11-09 ENCOUNTER — Encounter: Payer: Self-pay | Admitting: Urology

## 2016-11-09 VITALS — BP 117/67 | HR 108 | Ht 75.0 in | Wt 192.9 lb

## 2016-11-09 DIAGNOSIS — N529 Male erectile dysfunction, unspecified: Secondary | ICD-10-CM

## 2016-12-21 ENCOUNTER — Ambulatory Visit: Payer: BLUE CROSS/BLUE SHIELD | Admitting: Urology

## 2017-02-15 DIAGNOSIS — N401 Enlarged prostate with lower urinary tract symptoms: Secondary | ICD-10-CM | POA: Diagnosis not present

## 2017-02-15 DIAGNOSIS — E782 Mixed hyperlipidemia: Secondary | ICD-10-CM | POA: Diagnosis not present

## 2017-02-15 DIAGNOSIS — E1165 Type 2 diabetes mellitus with hyperglycemia: Secondary | ICD-10-CM | POA: Diagnosis not present

## 2017-02-15 DIAGNOSIS — Z0001 Encounter for general adult medical examination with abnormal findings: Secondary | ICD-10-CM | POA: Diagnosis not present

## 2017-03-27 DIAGNOSIS — E1165 Type 2 diabetes mellitus with hyperglycemia: Secondary | ICD-10-CM | POA: Diagnosis not present

## 2017-03-27 DIAGNOSIS — R350 Frequency of micturition: Secondary | ICD-10-CM | POA: Diagnosis not present

## 2017-04-05 DIAGNOSIS — L03012 Cellulitis of left finger: Secondary | ICD-10-CM | POA: Diagnosis not present

## 2017-04-05 DIAGNOSIS — E1165 Type 2 diabetes mellitus with hyperglycemia: Secondary | ICD-10-CM | POA: Diagnosis not present

## 2017-05-14 ENCOUNTER — Telehealth: Payer: Self-pay

## 2017-05-14 MED ORDER — DULAGLUTIDE 1.5 MG/0.5ML ~~LOC~~ SOAJ: 1.0000 "pen " | SUBCUTANEOUS | 3 refills | Status: DC

## 2017-05-14 NOTE — Telephone Encounter (Signed)
Pt called need a refills on trulicity send to his phar and pt aware

## 2017-05-15 ENCOUNTER — Other Ambulatory Visit: Payer: Self-pay

## 2017-06-11 ENCOUNTER — Other Ambulatory Visit: Payer: Self-pay

## 2017-06-11 MED ORDER — TAMSULOSIN HCL 0.4 MG PO CAPS: 0.4000 mg | ORAL_CAPSULE | Freq: Every day | ORAL | 3 refills | Status: DC

## 2017-06-28 ENCOUNTER — Encounter: Payer: Self-pay | Admitting: Nurse Practitioner

## 2017-06-28 ENCOUNTER — Ambulatory Visit: Payer: BLUE CROSS/BLUE SHIELD | Admitting: Nurse Practitioner

## 2017-06-28 VITALS — BP 120/80 | HR 87 | Resp 16 | Ht 75.0 in | Wt 183.0 lb

## 2017-06-28 DIAGNOSIS — I10 Essential (primary) hypertension: Secondary | ICD-10-CM

## 2017-06-28 DIAGNOSIS — E1165 Type 2 diabetes mellitus with hyperglycemia: Secondary | ICD-10-CM | POA: Diagnosis not present

## 2017-06-28 DIAGNOSIS — E7800 Pure hypercholesterolemia, unspecified: Secondary | ICD-10-CM | POA: Diagnosis not present

## 2017-06-28 LAB — POCT GLYCOSYLATED HEMOGLOBIN (HGB A1C): HEMOGLOBIN A1C: 9.7

## 2017-06-28 MED ORDER — GLIMEPIRIDE 2 MG PO TABS: 2.0000 mg | ORAL_TABLET | Freq: Every day | ORAL | 3 refills | Status: DC

## 2017-06-28 NOTE — Progress Notes (Signed)
[facility] [place], [place] [address]  Internal MEDICINE  Office Visit Note  Patient Name: Michael Gross  [id number]  [id number]  Date of Service: 07/08/2017  No chief complaint on file.   Diabetes  He presents for his follow-up diabetic visit. He has type 2 diabetes mellitus. His disease course has been improving. There are no hypoglycemic associated symptoms. Pertinent negatives for hypoglycemia include no headaches or tremors. Associated symptoms include fatigue, polydipsia and polyuria. Pertinent negatives for diabetes include no chest pain. There are no hypoglycemic complications. Symptoms are improving. There are no diabetic complications. Risk factors for coronary artery disease include dyslipidemia. He is compliant with treatment most of the time. He is following a generally healthy diet. Meal planning includes avoidance of concentrated sweets. He has not had a previous visit with a dietitian. He participates in exercise intermittently. His home blood glucose trend is decreasing steadily. He does not see a podiatrist.Eye exam is current.    Pt is here for routine follow up.    Current Medication: Outpatient Encounter Medications as of 06/28/2017  Medication Sig  . BAYER MICROLET LANCETS lancets Use as instructed to check blood sugar 2 times per day dx code E11.65  . Dulaglutide 1.5 MG/0.5ML SOPN Inject 1 pen into the skin once a week.  . finasteride (PROSCAR) 5 MG tablet Take 1 tablet (5 mg total) by mouth daily.  Marland Kitchen glucose blood (BAYER CONTOUR NEXT TEST) test strip Use as instructed to check blood sugar 3 times per day at various different times. dx code E11.65 Bring meter to every visit.  . Insulin Pen Needle (NOVOTWIST) 32G X 5 MM MISC Use one per day to inject Victoza  . metFORMIN (GLUCOPHAGE-XR) 750 MG 24 hr tablet Take 2 tablets (1,500 mg total) by mouth at bedtime.  . rosuvastatin (CRESTOR) 40 MG tablet Take 1 tablet (40 mg total) by mouth  daily.  . tamsulosin (FLOMAX) 0.4 MG CAPS capsule Take 1 capsule (0.4 mg total) by mouth daily.  Marland Kitchen glimepiride (AMARYL) 2 MG tablet Take 1 tablet (2 mg total) by mouth daily with breakfast.  . INVOKANA 300 MG TABS tablet take 1 tablet by mouth once daily BEFORE BREAKFAST  . Lancets 30G MISC Use to check blood sugars 5 times per day  . [DISCONTINUED] atorvastatin (LIPITOR) 40 MG tablet take 1 tablet by mouth once daily (Patient not taking: Reported on 08/17/2016)  . [DISCONTINUED] glimepiride (AMARYL) 2 MG tablet take 1 tablet by mouth once daily BEFORE SUPPER (Patient not taking: Reported on 07/20/2016)  . [DISCONTINUED] VICTOZA 18 MG/3ML SOPN inject 0.2 milliliters subcutaneously once daily (Patient not taking: Reported on 07/20/2016)   No facility-administered encounter medications on file as of 06/28/2017.     Surgical History: Past Surgical History:  Procedure Laterality Date  . HERNIA REPAIR     2003  . KNEE SURGERY     1998  . MOHS SURGERY     2003    Medical History: Past Medical History:  Diagnosis Date  . Basal cell carcinoma   . BPH (benign prostatic hyperplasia)   . Diabetes ([place])     Family History: Family History  Problem Relation Age of Onset  . Heart disease Father   . Prostate cancer Neg Hx   . Kidney cancer Neg Hx   . Bladder Cancer Neg Hx     Social History   Socioeconomic History  . Marital status: Married    Spouse name: Not on file  .  Number of children: Not on file  . Years of education: Not on file  . Highest education level: Not on file  Social Needs  . Financial resource strain: Not on file  . Food insecurity - worry: Not on file  . Food insecurity - inability: Not on file  . Transportation needs - medical: Not on file  . Transportation needs - non-medical: Not on file  Occupational History  . Not on file  Tobacco Use  . Smoking status: Never Smoker  . Smokeless tobacco: Never Used  Substance and Sexual Activity  . Alcohol use: Yes     Comment: rare  . Drug use: No  . Sexual activity: Not on file  Other Topics Concern  . Not on file  Social History Narrative  . Not on file      Review of Systems  Constitutional: Positive for fatigue. Negative for activity change, chills and unexpected weight change.  HENT: Negative for congestion, postnasal drip, rhinorrhea, sneezing and sore throat.   Eyes: Negative.  Negative for redness.  Respiratory: Negative for cough, chest tightness and shortness of breath.   Cardiovascular: Negative for chest pain and palpitations.  Gastrointestinal: Negative for abdominal pain, constipation, diarrhea, nausea and vomiting.  Endocrine: Positive for polydipsia and polyuria.       Blood sugars remain elevated, however, they are continuing to improve gradually.   Genitourinary: Negative.  Negative for dysuria and frequency.  Musculoskeletal: Negative for arthralgias, back pain, joint swelling and neck pain.  Skin: Negative for rash.  Allergic/Immunologic: Negative for environmental allergies.  Neurological: Negative for tremors, numbness and headaches.  Hematological: Negative for adenopathy. Does not bruise/bleed easily.  Psychiatric/Behavioral: Negative for behavioral problems (Depression), dysphoric mood, sleep disturbance and suicidal ideas.   Today's Vitals   06/28/17 1614  BP: 120/80  Pulse: 87  Resp: 16  SpO2: 94%  Weight: 183 lb (83 kg)  Height: 6\' 3"  (1.905 m)    Physical Exam  Constitutional: He is oriented to person, place, and time. He appears well-developed and well-nourished. No distress.  HENT:  Head: Normocephalic and atraumatic.  Mouth/Throat: Oropharynx is clear and moist. No oropharyngeal exudate.  Eyes: EOM are normal. Pupils are equal, round, and reactive to light.  Neck: Normal range of motion. Neck supple. No JVD present. Carotid bruit is not present. No tracheal deviation present. No thyromegaly present.  Cardiovascular: Normal rate, regular rhythm and normal  heart sounds. Exam reveals no gallop and no friction rub.  No murmur heard. Pulmonary/Chest: Effort normal and breath sounds normal. No respiratory distress. He has no wheezes. He has no rales. He exhibits no tenderness.  Abdominal: Soft. Bowel sounds are normal. There is no tenderness.  Musculoskeletal: Normal range of motion.  Lymphadenopathy:    He has no cervical adenopathy.  Neurological: He is alert and oriented to person, place, and time. No cranial nerve deficit.  Skin: Skin is warm and dry. He is not diaphoretic.  Psychiatric: He has a normal mood and affect. His behavior is normal. Judgment and thought content normal.  Nursing note and vitals reviewed.   Assessment/Plan: 1. Uncontrolled type 2 diabetes mellitus with hyperglycemia (HCC) - POCT HgB A1C 9.7 today. Improved from last visit. Will increase amaryl to 2mg  daily. Continue metformin as prescribed. Limit intake of carbohydrates and sugar and participate in routine exercise program.  - glimepiride (AMARYL) 2 MG tablet; Take 1 tablet (2 mg total) by mouth daily with breakfast.  Dispense: 30 tablet; Refill: 3  2. Essential  hypertension Stable.  Continue bp medication as prescribed .  3. Pure hypercholesterolemia Stable. Continue crestor as prescribed.    General Counseling: chamberlain steinborn understanding of the findings of todays visit and agrees with plan of treatment. I have discussed any further diagnostic evaluation that may be needed or ordered today. We also reviewed his medications today. he has been encouraged to call the office with any questions or concerns that should arise related to todays visit.  Diabetes Counseling:  1. Addition of ACE inh/ ARB'S for nephroprotection. 2. Diabetic foot care, prevention of complications.  3.Exercise and lose weight.  4. Diabetic eye examination, 5. Monitor blood sugar closlely. nutrition counseling.  6.Sign and symptoms of hypoglycemia including shaking sweating,confusion  and headaches.  This patient was seen by Leretha Pol, FNP- C in Collaboration with Dr Lavera Guise as a part of collaborative care agreement   Orders Placed This Encounter  Procedures  . POCT HgB A1C    Meds ordered this encounter  Medications  . glimepiride (AMARYL) 2 MG tablet    Sig: Take 1 tablet (2 mg total) by mouth daily with breakfast.    Dispense:  30 tablet    Refill:  3    Needs appointment for further refills    Order Specific Question:   Supervising Provider    Answer:   Lavera Guise [[id number]]    Time spent: 15 Minutes     Dr Lavera Guise Internal medicine

## 2017-07-09 ENCOUNTER — Other Ambulatory Visit: Payer: Self-pay

## 2017-07-09 MED ORDER — CANAGLIFLOZIN 300 MG PO TABS: 300.0000 mg | ORAL_TABLET | Freq: Every day | ORAL | 5 refills | Status: DC

## 2017-08-08 ENCOUNTER — Other Ambulatory Visit: Payer: Self-pay

## 2017-08-08 MED ORDER — FINASTERIDE 5 MG PO TABS: 5.0000 mg | ORAL_TABLET | Freq: Every day | ORAL | 3 refills | Status: DC

## 2017-08-08 MED ORDER — ROSUVASTATIN CALCIUM 40 MG PO TABS: 40.0000 mg | ORAL_TABLET | Freq: Every day | ORAL | 3 refills | Status: DC

## 2017-09-04 ENCOUNTER — Other Ambulatory Visit: Payer: Self-pay | Admitting: Internal Medicine

## 2017-09-27 ENCOUNTER — Ambulatory Visit: Payer: Self-pay | Admitting: Nurse Practitioner

## 2017-10-09 ENCOUNTER — Other Ambulatory Visit: Payer: Self-pay

## 2017-10-09 MED ORDER — DULAGLUTIDE 1.5 MG/0.5ML ~~LOC~~ SOAJ: SUBCUTANEOUS | 3 refills | Status: DC

## 2017-10-31 ENCOUNTER — Other Ambulatory Visit: Payer: Self-pay | Admitting: Nurse Practitioner

## 2017-10-31 DIAGNOSIS — Z0001 Encounter for general adult medical examination with abnormal findings: Secondary | ICD-10-CM | POA: Diagnosis not present

## 2017-10-31 DIAGNOSIS — Z125 Encounter for screening for malignant neoplasm of prostate: Secondary | ICD-10-CM | POA: Diagnosis not present

## 2017-10-31 DIAGNOSIS — E1165 Type 2 diabetes mellitus with hyperglycemia: Secondary | ICD-10-CM | POA: Diagnosis not present

## 2017-10-31 DIAGNOSIS — E782 Mixed hyperlipidemia: Secondary | ICD-10-CM | POA: Diagnosis not present

## 2017-10-31 DIAGNOSIS — I10 Essential (primary) hypertension: Secondary | ICD-10-CM | POA: Diagnosis not present

## 2017-11-01 LAB — COMPREHENSIVE METABOLIC PANEL
ALBUMIN: 4.5 g/dL (ref 3.6–4.8)
ALK PHOS: 73 IU/L (ref 39–117)
ALT: 24 IU/L (ref 0–44)
AST: 18 IU/L (ref 0–40)
Albumin/Globulin Ratio: 1.9 (ref 1.2–2.2)
BUN / CREAT RATIO: 21 (ref 10–24)
BUN: 15 mg/dL (ref 8–27)
Bilirubin Total: 0.4 mg/dL (ref 0.0–1.2)
CALCIUM: 9.1 mg/dL (ref 8.6–10.2)
CO2: 20 mmol/L (ref 20–29)
CREATININE: 0.72 mg/dL — AB (ref 0.76–1.27)
Chloride: 102 mmol/L (ref 96–106)
GFR calc Af Amer: 116 mL/min/{1.73_m2} (ref >59)
GFR calc non Af Amer: 100 mL/min/{1.73_m2} (ref >59)
GLUCOSE: 194 mg/dL — AB (ref 65–99)
Globulin, Total: 2.4 g/dL (ref 1.5–4.5)
Potassium: 4.3 mmol/L (ref 3.5–5.2)
Sodium: 140 mmol/L (ref 134–144)
Total Protein: 6.9 g/dL (ref 6.0–8.5)

## 2017-11-01 LAB — CBC
HEMOGLOBIN: 17.2 g/dL (ref 13.0–17.7)
Hematocrit: 48.3 % (ref 37.5–51.0)
MCH: 30.8 pg (ref 26.6–33.0)
MCHC: 35.6 g/dL (ref 31.5–35.7)
MCV: 86 fL (ref 79–97)
Platelets: 188 10*3/uL (ref 150–450)
RBC: 5.59 x10E6/uL (ref 4.14–5.80)
RDW: 13.7 % (ref 12.3–15.4)
WBC: 6.1 10*3/uL (ref 3.4–10.8)

## 2017-11-01 LAB — LIPID PANEL W/O CHOL/HDL RATIO
Cholesterol, Total: 179 mg/dL (ref 100–199)
HDL: 47 mg/dL (ref >39)
LDL CALC: 106 mg/dL — AB (ref 0–99)
TRIGLYCERIDES: 131 mg/dL (ref 0–149)
VLDL Cholesterol Cal: 26 mg/dL (ref 5–40)

## 2017-11-01 LAB — PSA: Prostate Specific Ag, Serum: 1.8 ng/mL (ref 0.0–4.0)

## 2017-11-01 LAB — MICROALBUMIN, URINE: MICROALBUM., U, RANDOM: 3.2 ug/mL

## 2017-11-01 LAB — TSH: TSH: 1.47 u[IU]/mL (ref 0.450–4.500)

## 2017-11-05 ENCOUNTER — Encounter: Payer: Self-pay | Admitting: Nurse Practitioner

## 2017-11-05 ENCOUNTER — Ambulatory Visit: Payer: BLUE CROSS/BLUE SHIELD | Admitting: Nurse Practitioner

## 2017-11-05 VITALS — BP 121/75 | HR 84 | Resp 16 | Ht 75.0 in | Wt 187.2 lb

## 2017-11-05 DIAGNOSIS — I10 Essential (primary) hypertension: Secondary | ICD-10-CM

## 2017-11-05 DIAGNOSIS — E7800 Pure hypercholesterolemia, unspecified: Secondary | ICD-10-CM

## 2017-11-05 DIAGNOSIS — E1165 Type 2 diabetes mellitus with hyperglycemia: Secondary | ICD-10-CM | POA: Diagnosis not present

## 2017-11-05 MED ORDER — GLIMEPIRIDE 4 MG PO TABS: 4.0000 mg | ORAL_TABLET | Freq: Two times a day (BID) | ORAL | 3 refills | Status: DC

## 2017-11-05 NOTE — Progress Notes (Signed)
[facility] [place], [place] [address]  Internal MEDICINE  Office Visit Note  Patient Name: Michael Gross  [id number]  [id number]  Date of Service: 11/28/2017   Pt is here for routine follow up.    Chief Complaint  Patient presents with  . Diabetes    follow up    Diabetes  He presents for his follow-up diabetic visit. He has type 2 diabetes mellitus. No MedicAlert identification noted. His disease course has been worsening. There are no hypoglycemic associated symptoms. Pertinent negatives for hypoglycemia include no headaches or tremors. There are no diabetic associated symptoms. Pertinent negatives for diabetes include no chest pain. There are no hypoglycemic complications. Symptoms are stable. There are no diabetic complications. Risk factors for coronary artery disease include dyslipidemia, hypertension, male sex and diabetes mellitus. Current diabetic treatment includes oral agent (triple therapy). He is compliant with treatment most of the time. His weight is stable. He is following a generally healthy diet. Meal planning includes avoidance of concentrated sweets. He has not had a previous visit with a dietitian. He participates in exercise three times a week. There is no change in his home blood glucose trend. An ACE inhibitor/angiotensin II receptor blocker is not being taken. He does not see a podiatrist.Eye exam is current.       Current Medication: Outpatient Encounter Medications as of 11/05/2017  Medication Sig  . BAYER MICROLET LANCETS lancets Use as instructed to check blood sugar 2 times per day dx code E11.65  . canagliflozin (INVOKANA) 300 MG TABS tablet Take 1 tablet (300 mg total) by mouth daily.  . Dulaglutide (TRULICITY) 1.5 OV/7.8HY SOPN Inject 1.5 mg under the skin every week  . finasteride (PROSCAR) 5 MG tablet Take 1 tablet (5 mg total) by mouth daily.  Marland Kitchen glimepiride (AMARYL) 4 MG tablet Take 1 tablet (4 mg total) by mouth 2  (two) times daily.  Marland Kitchen glucose blood (BAYER CONTOUR NEXT TEST) test strip Use as instructed to check blood sugar 3 times per day at various different times. dx code E11.65 Bring meter to every visit.  . Insulin Pen Needle (NOVOTWIST) 32G X 5 MM MISC Use one per day to inject Victoza  . Lancets 30G MISC Use to check blood sugars 5 times per day  . rosuvastatin (CRESTOR) 40 MG tablet Take 1 tablet (40 mg total) by mouth daily.  . tamsulosin (FLOMAX) 0.4 MG CAPS capsule Take 1 capsule (0.4 mg total) by mouth daily.  . [DISCONTINUED] glimepiride (AMARYL) 2 MG tablet Take 1 tablet (2 mg total) by mouth daily with breakfast.  . [DISCONTINUED] metFORMIN (GLUCOPHAGE-XR) 750 MG 24 hr tablet Take 2 tablets (1,500 mg total) by mouth at bedtime.   No facility-administered encounter medications on file as of 11/05/2017.     Surgical History: Past Surgical History:  Procedure Laterality Date  . HERNIA REPAIR     2003  . KNEE SURGERY     1998  . MOHS SURGERY     2003    Medical History: Past Medical History:  Diagnosis Date  . Basal cell carcinoma   . BPH (benign prostatic hyperplasia)   . Diabetes ([place])     Family History: Family History  Problem Relation Age of Onset  . Heart disease Father   . Prostate cancer Neg Hx   . Kidney cancer Neg Hx   . Bladder Cancer Neg Hx     Social History   Socioeconomic History  . Marital status: Married  Spouse name: Not on file  . Number of children: Not on file  . Years of education: Not on file  . Highest education level: Not on file  Occupational History  . Not on file  Social Needs  . Financial resource strain: Not on file  . Food insecurity:    Worry: Not on file    Inability: Not on file  . Transportation needs:    Medical: Not on file    Non-medical: Not on file  Tobacco Use  . Smoking status: Never Smoker  . Smokeless tobacco: Never Used  Substance and Sexual Activity  . Alcohol use: Yes    Comment: rare  . Drug use: No  .  Sexual activity: Not on file  Lifestyle  . Physical activity:    Days per week: Not on file    Minutes per session: Not on file  . Stress: Not on file  Relationships  . Social connections:    Talks on phone: Not on file    Gets together: Not on file    Attends religious service: Not on file    Active member of club or organization: Not on file    Attends meetings of clubs or organizations: Not on file    Relationship status: Not on file  . Intimate partner violence:    Fear of current or ex partner: Not on file    Emotionally abused: Not on file    Physically abused: Not on file    Forced sexual activity: Not on file  Other Topics Concern  . Not on file  Social History Narrative  . Not on file      Review of Systems  Constitutional: Negative for activity change, chills and unexpected weight change.  HENT: Negative for congestion, postnasal drip, rhinorrhea, sneezing and sore throat.   Eyes: Negative.  Negative for redness.  Respiratory: Negative for cough, chest tightness and shortness of breath.   Cardiovascular: Negative for chest pain and palpitations.  Gastrointestinal: Negative for abdominal pain, constipation, diarrhea, nausea and vomiting.  Endocrine:       Blood sugars remain elevated,   Genitourinary: Negative.  Negative for dysuria and frequency.  Musculoskeletal: Negative for arthralgias, back pain, joint swelling and neck pain.  Skin: Negative for rash.  Allergic/Immunologic: Negative for environmental allergies.  Neurological: Negative for tremors, numbness and headaches.  Hematological: Negative for adenopathy. Does not bruise/bleed easily.  Psychiatric/Behavioral: Negative for behavioral problems (Depression), dysphoric mood, sleep disturbance and suicidal ideas.   Today's Vitals   11/05/17 1603  BP: 121/75  Pulse: 84  Resp: 16  SpO2: 95%  Weight: 187 lb 3.2 oz (84.9 kg)  Height: 6\' 3"  (1.905 m)   Physical Exam  Constitutional: He is oriented to  person, place, and time. He appears well-developed and well-nourished. No distress.  HENT:  Head: Normocephalic and atraumatic.  Nose: Nose normal.  Mouth/Throat: Oropharynx is clear and moist. No oropharyngeal exudate.  Eyes: Pupils are equal, round, and reactive to light. Conjunctivae and EOM are normal.  Neck: Normal range of motion. Neck supple. No JVD present. Carotid bruit is not present. No tracheal deviation present. No thyromegaly present.  Cardiovascular: Normal rate, regular rhythm and normal heart sounds. Exam reveals no gallop and no friction rub.  No murmur heard. Pulmonary/Chest: Effort normal and breath sounds normal. No respiratory distress. He has no wheezes. He has no rales. He exhibits no tenderness.  Abdominal: Soft. Bowel sounds are normal. There is no tenderness.  Musculoskeletal: Normal  range of motion.  Lymphadenopathy:    He has no cervical adenopathy.  Neurological: He is alert and oriented to person, place, and time. No cranial nerve deficit.  Skin: Skin is warm and dry. He is not diaphoretic.  Psychiatric: He has a normal mood and affect. His behavior is normal. Judgment and thought content normal.  Nursing note and vitals reviewed.  Assessment/Plan: 1. Uncontrolled type 2 diabetes mellitus with hyperglycemia (HCC) - POCT HgB A1C 10.4. Increase amaryl to 4mg  twice daily. Reviewed ADA diet and importance of making diet choices to help control blood sugars. Continue metformin, trulicity, and invokana as prescribed  - glimepiride (AMARYL) 4 MG tablet; Take 1 tablet (4 mg total) by mouth 2 (two) times daily.  Dispense: 60 tablet; Refill: 3  2. Essential hypertension Well managed through diet and exercise.  3. Pure hypercholesterolemia Continue crestor as prescribed.   General Counseling: sedale jenifer understanding of the findings of todays visit and agrees with plan of treatment. I have discussed any further diagnostic evaluation that may be needed or  ordered today. We also reviewed his medications today. he has been encouraged to call the office with any questions or concerns that should arise related to todays visit.    Counseling:  Diabetes Counseling:  1. Addition of ACE inh/ ARB'S for nephroprotection. Microalbumin is updated  2. Diabetic foot care, prevention of complications. Podiatry consult 3. Exercise and lose weight.  4. Diabetic eye examination, Diabetic eye exam is updated  5. Monitor blood sugar closlely. nutrition counseling.  6. Sign and symptoms of hypoglycemia including shaking sweating,confusion and headaches.   This patient was seen by Leretha Pol FNP Collaboration with Dr Lavera Guise as a part of collaborative care agreement  Orders Placed This Encounter  Procedures  . POCT HgB A1C    Meds ordered this encounter  Medications  . glimepiride (AMARYL) 4 MG tablet    Sig: Take 1 tablet (4 mg total) by mouth 2 (two) times daily.    Dispense:  60 tablet    Refill:  3    Increased dose.    Order Specific Question:   Supervising Provider    Answer:   Lavera Guise [[id number]]    Time spent: 46 Minutes          Dr Lavera Guise Internal medicine

## 2017-11-09 ENCOUNTER — Other Ambulatory Visit: Payer: Self-pay | Admitting: Internal Medicine

## 2017-11-15 ENCOUNTER — Other Ambulatory Visit: Payer: Self-pay

## 2017-11-15 MED ORDER — METFORMIN HCL ER 750 MG PO TB24: 1500.0000 mg | ORAL_TABLET | Freq: Every day | ORAL | 1 refills | Status: DC

## 2017-11-19 ENCOUNTER — Encounter: Payer: Self-pay | Admitting: Adult Health

## 2017-11-19 ENCOUNTER — Ambulatory Visit (INDEPENDENT_AMBULATORY_CARE_PROVIDER_SITE_OTHER): Payer: BLUE CROSS/BLUE SHIELD | Admitting: Adult Health

## 2017-11-19 VITALS — BP 140/80 | HR 97 | Resp 16 | Ht 75.0 in | Wt 190.2 lb

## 2017-11-19 DIAGNOSIS — Z0001 Encounter for general adult medical examination with abnormal findings: Secondary | ICD-10-CM

## 2017-11-19 DIAGNOSIS — E1165 Type 2 diabetes mellitus with hyperglycemia: Secondary | ICD-10-CM

## 2017-11-19 DIAGNOSIS — N529 Male erectile dysfunction, unspecified: Secondary | ICD-10-CM | POA: Diagnosis not present

## 2017-11-19 DIAGNOSIS — R300 Dysuria: Secondary | ICD-10-CM

## 2017-11-19 NOTE — Progress Notes (Signed)
[facility] [place], [place] [address]  Internal MEDICINE  Office Visit Note  Patient Name: Michael Gross  [id number]  [id number]  Date of Service: 11/21/2017  Chief Complaint  Patient presents with  . Annual Exam     HPI Pt is here for routine health maintenance examination. He states he must have this exam for his insurance company.  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  He denies current issues.  He reports he is waiting for cologuard approval from insurance.  He states he is willing to have a traditional colonoscopy if the cologuard is not approved.       Current Medication: Outpatient Encounter Medications as of 11/19/2017  Medication Sig  . BAYER MICROLET LANCETS lancets Use as instructed to check blood sugar 2 times per day dx code E11.65  . canagliflozin (INVOKANA) 300 MG TABS tablet Take 1 tablet (300 mg total) by mouth daily.  . Dulaglutide (TRULICITY) 1.5 TG/6.2IR SOPN Inject 1.5 mg under the skin every week  . finasteride (PROSCAR) 5 MG tablet Take 1 tablet (5 mg total) by mouth daily.  Marland Kitchen glimepiride (AMARYL) 4 MG tablet Take 1 tablet (4 mg total) by mouth 2 (two) times daily.  Marland Kitchen glucose blood (BAYER CONTOUR NEXT TEST) test strip Use as instructed to check blood sugar 3 times per day at various different times. dx code E11.65 Bring meter to every visit.  . Insulin Pen Needle (NOVOTWIST) 32G X 5 MM MISC Use one per day to inject Victoza  . Lancets 30G MISC Use to check blood sugars 5 times per day  . metFORMIN (GLUCOPHAGE) 500 MG tablet TAKE 1 TABLET BY MOUTH EVERY DAY  . metFORMIN (GLUCOPHAGE) 500 MG tablet   . metFORMIN (GLUCOPHAGE-XR) 750 MG 24 hr tablet Take 2 tablets (1,500 mg total) by mouth at bedtime.  . rosuvastatin (CRESTOR) 40 MG tablet Take 1 tablet (40 mg total) by mouth daily.  . tamsulosin (FLOMAX) 0.4 MG CAPS capsule Take 1 capsule (0.4 mg total) by mouth daily.   No facility-administered encounter  medications on file as of 11/19/2017.     Surgical History: Past Surgical History:  Procedure Laterality Date  . HERNIA REPAIR     2003  . KNEE SURGERY     1998  . MOHS SURGERY     2003    Medical History: Past Medical History:  Diagnosis Date  . Basal cell carcinoma   . BPH (benign prostatic hyperplasia)   . Diabetes ([place])     Family History: Family History  Problem Relation Age of Onset  . Heart disease Father   . Prostate cancer Neg Hx   . Kidney cancer Neg Hx   . Bladder Cancer Neg Hx       Review of Systems  Constitutional: Negative for appetite change, chills, fatigue and fever.  HENT: Negative for congestion, dental problem, ear pain, hearing loss, nosebleeds, sinus pain, sore throat, tinnitus and voice change.   Eyes: Negative for photophobia, pain, redness and itching.  Respiratory: Negative for cough, chest tightness, shortness of breath and wheezing.   Cardiovascular: Negative for chest pain, palpitations and leg swelling.  Gastrointestinal: Positive for abdominal distention. Negative for abdominal pain, anal bleeding, constipation and diarrhea.  Genitourinary: Negative for discharge, dysuria, flank pain, hematuria, penile pain, penile swelling, scrotal swelling and testicular pain.  Musculoskeletal: Negative for arthralgias, back pain, gait problem, myalgias and neck pain.  Skin: Negative for color change, pallor,  rash and wound.  Neurological: Negative for dizziness, seizures, speech difficulty, light-headedness, numbness and headaches.  Hematological: Negative.   Psychiatric/Behavioral: Negative.      Vital Signs: BP 140/80   Pulse 97   Resp 16   Ht 6\' 3"  (1.905 m)   Wt 190 lb 3.2 oz (86.3 kg)   SpO2 96%   BMI 23.77 kg/m    Physical Exam  Constitutional: He is oriented to person, place, and time. He appears well-developed and well-nourished.  Eyes: Pupils are equal, round, and reactive to light.  Neck: Normal range of motion. Neck supple. No  JVD present.  Cardiovascular: Normal rate, normal heart sounds and intact distal pulses.  Pulmonary/Chest: Effort normal and breath sounds normal.  Abdominal: Soft. Bowel sounds are normal. He exhibits no distension. There is no tenderness. A hernia is present. Hernia confirmed positive in the ventral area. Hernia confirmed negative in the right inguinal area and confirmed negative in the left inguinal area.  Ventral Hernia noted upon abd muscle flexion.  approximatley 10cm vertical at midline.   Genitourinary: Testes normal and penis normal. Cremasteric reflex is present. Right testis shows no mass, no swelling and no tenderness. Left testis shows no mass, no swelling and no tenderness. Uncircumcised.  Musculoskeletal: Normal range of motion. He exhibits no edema, tenderness or deformity.  Lymphadenopathy:       Head (right side): No submental, no submandibular, no tonsillar, no preauricular, no posterior auricular and no occipital adenopathy present.       Head (left side): No submental, no submandibular, no tonsillar, no preauricular, no posterior auricular and no occipital adenopathy present.    He has no cervical adenopathy.       Right axillary: No pectoral adenopathy present.       Left axillary: No pectoral and no lateral adenopathy present.No inguinal adenopathy noted on the right or left side.       Right: No inguinal and no supraclavicular adenopathy present.       Left: No inguinal and no supraclavicular adenopathy present.  Neurological: He is alert and oriented to person, place, and time. He has normal strength and normal reflexes. No cranial nerve deficit or sensory deficit. He displays a negative Romberg sign. GCS eye subscore is 4. GCS verbal subscore is 5. GCS motor subscore is 6.  Skin: Skin is warm and dry. Capillary refill takes less than 2 seconds. No rash noted. No cyanosis or erythema. No pallor. Nails show no clubbing.  Psychiatric: He has a normal mood and affect.  Nursing  note and vitals reviewed.    LABS: Recent Results (from the past 2160 hour(s))  Comprehensive metabolic panel     Status: Abnormal   Collection Time: 10/31/17  8:06 AM  Result Value Ref Range   Glucose 194 (H) 65 - 99 mg/dL   BUN 15 8 - 27 mg/dL   Creatinine, Ser 0.72 (L) 0.76 - 1.27 mg/dL   GFR calc non Af Amer 100 >59 mL/min/1.73   GFR calc Af Amer 116 >59 mL/min/1.73   BUN/Creatinine Ratio 21 10 - 24   Sodium 140 134 - 144 mmol/L   Potassium 4.3 3.5 - 5.2 mmol/L   Chloride 102 96 - 106 mmol/L   CO2 20 20 - 29 mmol/L   Calcium 9.1 8.6 - 10.2 mg/dL   Total Protein 6.9 6.0 - 8.5 g/dL   Albumin 4.5 3.6 - 4.8 g/dL   Globulin, Total 2.4 1.5 - 4.5 g/dL   Albumin/Globulin Ratio 1.9  1.2 - 2.2   Bilirubin Total 0.4 0.0 - 1.2 mg/dL   Alkaline Phosphatase 73 39 - 117 IU/L   AST 18 0 - 40 IU/L   ALT 24 0 - 44 IU/L  CBC     Status: None   Collection Time: 10/31/17  8:06 AM  Result Value Ref Range   WBC 6.1 3.4 - 10.8 x10E3/uL   RBC 5.59 4.14 - 5.80 x10E6/uL   Hemoglobin 17.2 13.0 - 17.7 g/dL   Hematocrit 48.3 37.5 - 51.0 %   MCV 86 79 - 97 fL   MCH 30.8 26.6 - 33.0 pg   MCHC 35.6 31.5 - 35.7 g/dL   RDW 13.7 12.3 - 15.4 %   Platelets 188 150 - 450 x10E3/uL  Lipid Panel w/o Chol/HDL Ratio     Status: Abnormal   Collection Time: 10/31/17  8:06 AM  Result Value Ref Range   Cholesterol, Total 179 100 - 199 mg/dL   Triglycerides 131 0 - 149 mg/dL   HDL 47 >39 mg/dL   VLDL Cholesterol Cal 26 5 - 40 mg/dL   LDL Calculated 106 (H) 0 - 99 mg/dL  TSH     Status: None   Collection Time: 10/31/17  8:06 AM  Result Value Ref Range   TSH 1.470 0.450 - 4.500 uIU/mL  PSA     Status: None   Collection Time: 10/31/17  8:06 AM  Result Value Ref Range   Prostate Specific Ag, Serum 1.8 0.0 - 4.0 ng/mL    Comment: Roche ECLIA methodology. According to the American Urological Association, Serum PSA should decrease and remain at undetectable levels after radical prostatectomy. The AUA defines  biochemical recurrence as an initial PSA value 0.2 ng/mL or greater followed by a subsequent confirmatory PSA value 0.2 ng/mL or greater. Values obtained with different assay methods or kits cannot be used interchangeably. Results cannot be interpreted as absolute evidence of the presence or absence of malignant disease.   Microalbumin, urine     Status: None   Collection Time: 10/31/17  8:06 AM  Result Value Ref Range   Microalbumin, Urine 3.2 Not Estab. ug/mL  UA/M w/rflx Culture, Routine     Status: Abnormal   Collection Time: 11/19/17  4:15 PM  Result Value Ref Range   Specific Gravity, UA      >=1.030 (A) 1.005 - 1.030   pH, UA 6.0 5.0 - 7.5   Color, UA Yellow Yellow   Appearance Ur Clear Clear   Leukocytes, UA Negative Negative   Protein, UA Negative Negative/Trace   Glucose, UA 3+ (A) Negative   Ketones, UA Trace (A) Negative   RBC, UA Negative Negative   Bilirubin, UA Negative Negative   Urobilinogen, Ur 0.2 0.2 - 1.0 mg/dL   Nitrite, UA Negative Negative   Microscopic Examination Comment     Comment: Microscopic follows if indicated.   Microscopic Examination See below:     Comment: Microscopic was indicated and was performed.   Urinalysis Reflex Comment     Comment: This specimen will not reflex to a Urine Culture.  Microscopic Examination     Status: None   Collection Time: 11/19/17  4:15 PM  Result Value Ref Range   WBC, UA 0-5 0 - 5 /hpf   RBC, UA None seen 0 - 2 /hpf   Epithelial Cells (non renal) None seen 0 - 10 /hpf   Casts None seen None seen /lpf   Bacteria, UA Few None seen/Few  Specimen  status report     Status: None (Preliminary result)   Collection Time: 11/19/17  4:15 PM  Result Value Ref Range   specimen status report Comment     Comment: No Micro Urine Received     Assessment/Plan: 1. Well adult health check Routine well check.  PT reports lab work is already completed on 10/31/17.  See flow sheet.   2. Erectile dysfunction, unspecified  erectile dysfunction type Patient continues to report issues with erectile dysfunction.  He states he would like to see urology again. 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  He declined a referral at this time.   3. Dysuria - UA/M w/rflx Culture, Routine, microalbumine has been ordered   4. Uncontrolled type 2 diabetes mellitus with hyperglycemia ([place]) He reports blood sugars appear under control thanks to medication increase at last visit. Encouraged compliance with diet. (Might want to add ace inh and  Statin in future)   General Counseling: Anselm Pancoast understanding of the findings of todays visit and agrees with plan of treatment. I have discussed any further diagnostic evaluation that may be needed or ordered today. We also reviewed his medications today. he has been encouraged to call the office with any questions or concerns that should arise related to todays visit.   Orders Placed This Encounter  Procedures  . Microscopic Examination  . UA/M w/rflx Culture, Routine  . Specimen status report     Time spent: 30 Minutes   This patient was seen by Orson Gear AGNP-C in Collaboration with Dr Lavera Guise as a part of collaborative care agreement   Lavera Guise, MD  Internal Medicine

## 2017-11-20 LAB — SPECIMEN STATUS REPORT

## 2017-11-21 LAB — POCT GLYCOSYLATED HEMOGLOBIN (HGB A1C): Hemoglobin A1C: 10.4 % — AB (ref 4.0–5.6)

## 2017-11-22 ENCOUNTER — Telehealth: Payer: Self-pay

## 2017-11-22 NOTE — Telephone Encounter (Signed)
FAXED COLOGUARD ORDER WITH FACESHEET FOR PATIENT.

## 2017-11-29 ENCOUNTER — Ambulatory Visit (INDEPENDENT_AMBULATORY_CARE_PROVIDER_SITE_OTHER): Payer: BLUE CROSS/BLUE SHIELD

## 2017-11-29 ENCOUNTER — Other Ambulatory Visit: Payer: Self-pay | Admitting: Adult Health

## 2017-11-29 DIAGNOSIS — E1165 Type 2 diabetes mellitus with hyperglycemia: Secondary | ICD-10-CM | POA: Diagnosis not present

## 2017-11-29 DIAGNOSIS — Z0001 Encounter for general adult medical examination with abnormal findings: Secondary | ICD-10-CM

## 2017-11-29 LAB — UA/M W/RFLX CULTURE, ROUTINE
BILIRUBIN UA: NEGATIVE
LEUKOCYTES UA: NEGATIVE
Nitrite, UA: NEGATIVE
PH UA: 6 (ref 5.0–7.5)
PROTEIN UA: NEGATIVE
RBC, UA: NEGATIVE
Urobilinogen, Ur: 0.2 mg/dL (ref 0.2–1.0)

## 2017-11-29 LAB — MICROSCOPIC EXAMINATION
CASTS: NONE SEEN /LPF
EPITHELIAL CELLS (NON RENAL): NONE SEEN /HPF (ref 0–10)
RBC, UA: NONE SEEN /hpf (ref 0–2)

## 2017-11-29 LAB — POCT GLYCOSYLATED HEMOGLOBIN (HGB A1C): HEMOGLOBIN A1C: 8.2 % — AB (ref 4.0–5.6)

## 2017-11-29 NOTE — Progress Notes (Unsigned)
Pt no longer using Victoza, order for needles D/Cd. Reprinted patients med list.

## 2017-12-09 DIAGNOSIS — Z1212 Encounter for screening for malignant neoplasm of rectum: Secondary | ICD-10-CM | POA: Diagnosis not present

## 2017-12-09 DIAGNOSIS — Z1211 Encounter for screening for malignant neoplasm of colon: Secondary | ICD-10-CM | POA: Diagnosis not present

## 2017-12-10 ENCOUNTER — Other Ambulatory Visit: Payer: Self-pay

## 2017-12-10 MED ORDER — FINASTERIDE 5 MG PO TABS: 5.0000 mg | ORAL_TABLET | Freq: Every day | ORAL | 3 refills | Status: DC

## 2017-12-20 ENCOUNTER — Telehealth: Payer: Self-pay

## 2017-12-20 NOTE — Telephone Encounter (Signed)
Received negative cologuard results, will give to patient's provider

## 2017-12-20 NOTE — Telephone Encounter (Signed)
Called patient and informed him that his cologuard results came back negative

## 2018-01-28 ENCOUNTER — Other Ambulatory Visit: Payer: Self-pay | Admitting: Adult Health

## 2018-01-28 MED ORDER — DULAGLUTIDE 1.5 MG/0.5ML ~~LOC~~ SOAJ: SUBCUTANEOUS | 3 refills | Status: DC

## 2018-02-11 ENCOUNTER — Other Ambulatory Visit: Payer: Self-pay | Admitting: Nurse Practitioner

## 2018-02-11 MED ORDER — ROSUVASTATIN CALCIUM 40 MG PO TABS
40.0000 mg | ORAL_TABLET | Freq: Every day | ORAL | 3 refills | Status: DC
Start: 2018-02-11 — End: 2018-08-23

## 2018-02-27 ENCOUNTER — Ambulatory Visit: Payer: Self-pay | Admitting: Adult Health

## 2018-03-05 ENCOUNTER — Other Ambulatory Visit: Payer: Self-pay | Admitting: Nurse Practitioner

## 2018-03-05 DIAGNOSIS — E1165 Type 2 diabetes mellitus with hyperglycemia: Secondary | ICD-10-CM

## 2018-03-08 ENCOUNTER — Other Ambulatory Visit: Payer: Self-pay

## 2018-03-08 MED ORDER — CANAGLIFLOZIN 300 MG PO TABS: 300.0000 mg | ORAL_TABLET | Freq: Every day | ORAL | 5 refills | Status: DC

## 2018-03-27 ENCOUNTER — Ambulatory Visit: Payer: BLUE CROSS/BLUE SHIELD | Admitting: Adult Health

## 2018-03-27 ENCOUNTER — Encounter: Payer: Self-pay | Admitting: Adult Health

## 2018-03-27 VITALS — BP 110/78 | HR 85 | Resp 16 | Ht 75.0 in | Wt 201.0 lb

## 2018-03-27 DIAGNOSIS — E1165 Type 2 diabetes mellitus with hyperglycemia: Secondary | ICD-10-CM

## 2018-03-27 DIAGNOSIS — E7800 Pure hypercholesterolemia, unspecified: Secondary | ICD-10-CM

## 2018-03-27 DIAGNOSIS — I10 Essential (primary) hypertension: Secondary | ICD-10-CM

## 2018-03-27 DIAGNOSIS — N529 Male erectile dysfunction, unspecified: Secondary | ICD-10-CM | POA: Diagnosis not present

## 2018-03-27 LAB — POCT GLYCOSYLATED HEMOGLOBIN (HGB A1C): HEMOGLOBIN A1C: 9 % — AB (ref 4.0–5.6)

## 2018-03-27 NOTE — Patient Instructions (Signed)
Diabetes Mellitus and Nutrition When you have diabetes (diabetes mellitus), it is very important to have healthy eating habits because your blood sugar (glucose) levels are greatly affected by what you eat and drink. Eating healthy foods in the appropriate amounts, at about the same times every day, can help you:  Control your blood glucose.  Lower your risk of heart disease.  Improve your blood pressure.  Reach or maintain a healthy weight.  Every person with diabetes is different, and each person has different needs for a meal plan. Your health care provider may recommend that you work with a diet and nutrition specialist (dietitian) to make a meal plan that is best for you. Your meal plan may vary depending on factors such as:  The calories you need.  The medicines you take.  Your weight.  Your blood glucose, blood pressure, and cholesterol levels.  Your activity level.  Other health conditions you have, such as heart or kidney disease.  How do carbohydrates affect me? Carbohydrates affect your blood glucose level more than any other type of food. Eating carbohydrates naturally increases the amount of glucose in your blood. Carbohydrate counting is a method for keeping track of how many carbohydrates you eat. Counting carbohydrates is important to keep your blood glucose at a healthy level, especially if you use insulin or take certain oral diabetes medicines. It is important to know how many carbohydrates you can safely have in each meal. This is different for every person. Your dietitian can help you calculate how many carbohydrates you should have at each meal and for snack. Foods that contain carbohydrates include:  Bread, cereal, rice, pasta, and crackers.  Potatoes and corn.  Peas, beans, and lentils.  Milk and yogurt.  Fruit and juice.  Desserts, such as cakes, cookies, ice cream, and candy.  How does alcohol affect me? Alcohol can cause a sudden decrease in blood  glucose (hypoglycemia), especially if you use insulin or take certain oral diabetes medicines. Hypoglycemia can be a life-threatening condition. Symptoms of hypoglycemia (sleepiness, dizziness, and confusion) are similar to symptoms of having too much alcohol. If your health care provider says that alcohol is safe for you, follow these guidelines:  Limit alcohol intake to no more than 1 drink per day for nonpregnant women and 2 drinks per day for men. One drink equals 12 oz of beer, 5 oz of wine, or 1 oz of hard liquor.  Do not drink on an empty stomach.  Keep yourself hydrated with water, diet soda, or unsweetened iced tea.  Keep in mind that regular soda, juice, and other mixers may contain a lot of sugar and must be counted as carbohydrates.  What are tips for following this plan? Reading food labels  Start by checking the serving size on the label. The amount of calories, carbohydrates, fats, and other nutrients listed on the label are based on one serving of the food. Many foods contain more than one serving per package.  Check the total grams (g) of carbohydrates in one serving. You can calculate the number of servings of carbohydrates in one serving by dividing the total carbohydrates by 15. For example, if a food has 30 g of total carbohydrates, it would be equal to 2 servings of carbohydrates.  Check the number of grams (g) of saturated and trans fats in one serving. Choose foods that have low or no amount of these fats.  Check the number of milligrams (mg) of sodium in one serving. Most people   should limit total sodium intake to less than 2,300 mg per day.  Always check the nutrition information of foods labeled as "low-fat" or "nonfat". These foods may be higher in added sugar or refined carbohydrates and should be avoided.  Talk to your dietitian to identify your daily goals for nutrients listed on the label. Shopping  Avoid buying canned, premade, or processed foods. These  foods tend to be high in fat, sodium, and added sugar.  Shop around the outside edge of the grocery store. This includes fresh fruits and vegetables, bulk grains, fresh meats, and fresh dairy. Cooking  Use low-heat cooking methods, such as baking, instead of high-heat cooking methods like deep frying.  Cook using healthy oils, such as olive, canola, or sunflower oil.  Avoid cooking with butter, cream, or high-fat meats. Meal planning  Eat meals and snacks regularly, preferably at the same times every day. Avoid going long periods of time without eating.  Eat foods high in fiber, such as fresh fruits, vegetables, beans, and whole grains. Talk to your dietitian about how many servings of carbohydrates you can eat at each meal.  Eat 4-6 ounces of lean protein each day, such as lean meat, chicken, fish, eggs, or tofu. 1 ounce is equal to 1 ounce of meat, chicken, or fish, 1 egg, or 1/4 cup of tofu.  Eat some foods each day that contain healthy fats, such as avocado, nuts, seeds, and fish. Lifestyle   Check your blood glucose regularly.  Exercise at least 30 minutes 5 or more days each week, or as told by your health care provider.  Take medicines as told by your health care provider.  Do not use any products that contain nicotine or tobacco, such as cigarettes and e-cigarettes. If you need help quitting, ask your health care provider.  Work with a counselor or diabetes educator to identify strategies to manage stress and any emotional and social challenges. What are some questions to ask my health care provider?  Do I need to meet with a diabetes educator?  Do I need to meet with a dietitian?  What number can I call if I have questions?  When are the best times to check my blood glucose? Where to find more information:  American Diabetes Association: diabetes.org/food-and-fitness/food  Academy of Nutrition and Dietetics:  www.eatright.org/resources/health/diseases-and-conditions/diabetes  National Institute of Diabetes and Digestive and Kidney Diseases (NIH): www.niddk.nih.gov/health-information/diabetes/overview/diet-eating-physical-activity Summary  A healthy meal plan will help you control your blood glucose and maintain a healthy lifestyle.  Working with a diet and nutrition specialist (dietitian) can help you make a meal plan that is best for you.  Keep in mind that carbohydrates and alcohol have immediate effects on your blood glucose levels. It is important to count carbohydrates and to use alcohol carefully. This information is not intended to replace advice given to you by your health care provider. Make sure you discuss any questions you have with your health care provider. Document Released: 01/19/2005 Document Revised: 05/29/2016 Document Reviewed: 05/29/2016 Elsevier Interactive Patient Education  2018 Elsevier Inc.  

## 2018-03-27 NOTE — Progress Notes (Signed)
[facility] [place], [place] [address]  Internal MEDICINE  Office Visit Note  Patient Name: Michael Gross  [id number]  [id number]  Date of Service: 03/27/2018  Chief Complaint  Patient presents with  . Diabetes  . Hyperlipidemia    HPI Patient is here for follow-up on diabetes, hypertension, and hyperlipidemia.  He reports his blood pressure and hyperlipidemia are well controlled at this time.  He will be due for a lipid panel assessment in the new year.  His A1c was checked today and has increased in the last 4 months from 8.2-9.0.  He reports that he had difficulty refilling his diabetes medications and went without some of them for up to 3 weeks.  He states that the pharmacy was giving him a hard time although he had refills available.  Apparently there was a computer issue in the pharmacist at [facility] [place] finally took care of this for the patient.  I instructed the patient that his diabetes medications were paramount for his health and that if he ever had issues getting it medications refilled he should call the office or message [place] through my chart so that we could send a new prescription with refills in the pharmacy would be able to fill at that day.  Also encouraged patient to not wait until the last minute and that he was out of medication to get his refills.  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  Patient has attempted multiple p.o. medications for erectile dysfunction in the past.  He was even seen by [facility] and received injections for erectile dysfunction with no results to speak of.  He is interested in further treatment as well as possible evaluation for a surgically implanted pump.  He has heard advertisements on the radio for the [facility] in [place] where they do affordable evaluations for man with erectile  dysfunction.  He is thinking of seeing what kind of plan they can offer him as well as evaluate the cost.  He reports he will call the office for referral to urology when he is ready.   Current Medication: Outpatient Encounter Medications as of 03/27/2018  Medication Sig  . BAYER MICROLET LANCETS lancets Use as instructed to check blood sugar 2 times per day dx code E11.65  . canagliflozin (INVOKANA) 300 MG TABS tablet Take 1 tablet (300 mg total) by mouth daily.  . Dulaglutide (TRULICITY) 1.5 XH/3.7JI SOPN Inject 1.5 mg under the skin every week  . finasteride (PROSCAR) 5 MG tablet Take 1 tablet (5 mg total) by mouth daily.  Marland Kitchen glimepiride (AMARYL) 4 MG tablet TAKE 1 TABLET BY MOUTH TWICE DAILY  . glucose blood (BAYER CONTOUR NEXT TEST) test strip Use as instructed to check blood sugar 3 times per day at various different times. dx code E11.65 Bring meter to every visit.  . Lancets 30G MISC Use to check blood sugars 5 times per day  . metFORMIN (GLUCOPHAGE) 500 MG tablet TAKE 1 TABLET BY MOUTH EVERY DAY  . metFORMIN (GLUCOPHAGE-XR) 750 MG 24 hr tablet Take 2 tablets (1,500 mg total) by mouth at bedtime.  . rosuvastatin (CRESTOR) 40 MG tablet Take 1 tablet (40 mg total) by mouth daily.  . tamsulosin (FLOMAX) 0.4 MG CAPS capsule Take 1 capsule (0.4 mg total) by mouth daily.  . [DISCONTINUED] metFORMIN (GLUCOPHAGE) 500 MG tablet    No  facility-administered encounter medications on file as of 03/27/2018.     Surgical History: Past Surgical History:  Procedure Laterality Date  . HERNIA REPAIR     2003  . KNEE SURGERY     1998  . MOHS SURGERY     2003    Medical History: Past Medical History:  Diagnosis Date  . Basal cell carcinoma   . BPH (benign prostatic hyperplasia)   . Diabetes ([place])   . Hyperlipidemia     Family History: Family History  Problem Relation Age of Onset  . Heart disease Father   . Prostate cancer Neg Hx   . Kidney cancer Neg Hx   . Bladder Cancer Neg Hx      Social History   Socioeconomic History  . Marital status: Married    Spouse name: Not on file  . Number of children: Not on file  . Years of education: Not on file  . Highest education level: Not on file  Occupational History  . Not on file  Social Needs  . Financial resource strain: Not on file  . Food insecurity:    Worry: Not on file    Inability: Not on file  . Transportation needs:    Medical: Not on file    Non-medical: Not on file  Tobacco Use  . Smoking status: Never Smoker  . Smokeless tobacco: Never Used  Substance and Sexual Activity  . Alcohol use: Yes    Comment: rare  . Drug use: No  . Sexual activity: Not on file  Lifestyle  . Physical activity:    Days per week: Not on file    Minutes per session: Not on file  . Stress: Not on file  Relationships  . Social connections:    Talks on phone: Not on file    Gets together: Not on file    Attends religious service: Not on file    Active member of club or organization: Not on file    Attends meetings of clubs or organizations: Not on file    Relationship status: Not on file  . Intimate partner violence:    Fear of current or ex partner: Not on file    Emotionally abused: Not on file    Physically abused: Not on file    Forced sexual activity: Not on file  Other Topics Concern  . Not on file  Social History Narrative  . Not on file      Review of Systems  Constitutional: Negative.  Negative for chills, fatigue and unexpected weight change.  HENT: Negative.  Negative for congestion, rhinorrhea, sneezing and sore throat.   Eyes: Negative for redness.  Respiratory: Negative.  Negative for cough, chest tightness and shortness of breath.   Cardiovascular: Negative.  Negative for chest pain and palpitations.  Gastrointestinal: Negative.  Negative for abdominal pain, constipation, diarrhea, nausea and vomiting.  Endocrine: Negative.   Genitourinary: Negative.  Negative for dysuria and frequency.   Musculoskeletal: Negative.  Negative for arthralgias, back pain, joint swelling and neck pain.  Skin: Negative.  Negative for rash.  Allergic/Immunologic: Negative.   Neurological: Negative.  Negative for tremors and numbness.  Hematological: Negative for adenopathy. Does not bruise/bleed easily.  Psychiatric/Behavioral: Negative.  Negative for behavioral problems, sleep disturbance and suicidal ideas. The patient is not nervous/anxious.     Vital Signs: BP 110/78 (BP Location: Left Arm, Patient Position: Sitting, Cuff Size: Normal)   Pulse 85   Resp 16   Ht 6\' 3"  (  1.905 m)   Wt 201 lb (91.2 kg)   SpO2 94%   BMI 25.12 kg/m    Physical Exam  Constitutional: He is oriented to person, place, and time. He appears well-developed and well-nourished. No distress.  HENT:  Head: Normocephalic and atraumatic.  Mouth/Throat: Oropharynx is clear and moist. No oropharyngeal exudate.  Eyes: Pupils are equal, round, and reactive to light. EOM are normal.  Neck: Normal range of motion. Neck supple. No JVD present. No tracheal deviation present. No thyromegaly present.  Cardiovascular: Normal rate, regular rhythm and normal heart sounds. Exam reveals no gallop and no friction rub.  No murmur heard. Pulmonary/Chest: Effort normal and breath sounds normal. No respiratory distress. He has no wheezes. He has no rales. He exhibits no tenderness.  Abdominal: Soft. There is no tenderness. There is no guarding.  Musculoskeletal: Normal range of motion.  Lymphadenopathy:    He has no cervical adenopathy.  Neurological: He is alert and oriented to person, place, and time. No cranial nerve deficit.  Skin: Skin is warm and dry. He is not diaphoretic.  Psychiatric: He has a normal mood and affect. His behavior is normal. Judgment and thought content normal.  Nursing note and vitals reviewed.  Assessment/Plan: 1. Uncontrolled type 2 diabetes mellitus with hyperglycemia ([place]) Again encouraged patient to  have complete medication compliance and to call the office if he had any difficulty obtaining his medications.  We will recheck his hemoglobin A1c in 3 months. - POCT HgB A1C  2. Essential hypertension Patient's blood pressure is stable, encouraged him to continue taking his medications as prescribed.  3. Pure hypercholesterolemia Patient will need lipid panel in the new year for evaluation of cholesterol.  4. Erectile dysfunction, unspecified erectile dysfunction type Had long discussion about ED with patient.  Patient will call office if and when he is ready for urology referral.  General Counseling: alva broxson understanding of the findings of todays visit and agrees with plan of treatment. I have discussed any further diagnostic evaluation that may be needed or ordered today. We also reviewed his medications today. he has been encouraged to call the office with any questions or concerns that should arise related to todays visit.    Orders Placed This Encounter  Procedures  . POCT HgB A1C    No orders of the defined types were placed in this encounter.   Time spent: 40 Minutes   This patient was seen by Orson Gear AGNP-C in Collaboration with Dr Lavera Guise as a part of collaborative care agreement     Kendell Bane AGNP-C Internal medicine

## 2018-04-03 ENCOUNTER — Other Ambulatory Visit: Payer: Self-pay

## 2018-04-03 DIAGNOSIS — E1165 Type 2 diabetes mellitus with hyperglycemia: Secondary | ICD-10-CM

## 2018-04-03 MED ORDER — GLIMEPIRIDE 4 MG PO TABS: 4.0000 mg | ORAL_TABLET | Freq: Two times a day (BID) | ORAL | 2 refills | Status: DC

## 2018-05-16 ENCOUNTER — Other Ambulatory Visit: Payer: Self-pay

## 2018-05-16 MED ORDER — METFORMIN HCL 500 MG PO TABS: 500.0000 mg | ORAL_TABLET | Freq: Every day | ORAL | 5 refills | Status: DC

## 2018-05-16 MED ORDER — FINASTERIDE 5 MG PO TABS: 5.0000 mg | ORAL_TABLET | Freq: Every day | ORAL | 3 refills | Status: DC

## 2018-05-20 ENCOUNTER — Other Ambulatory Visit: Payer: Self-pay

## 2018-05-20 MED ORDER — METFORMIN HCL ER 750 MG PO TB24: 1500.0000 mg | ORAL_TABLET | Freq: Every day | ORAL | 1 refills | Status: DC

## 2018-06-14 ENCOUNTER — Other Ambulatory Visit: Payer: Self-pay | Admitting: Adult Health

## 2018-06-27 ENCOUNTER — Ambulatory Visit: Payer: Self-pay | Admitting: Adult Health

## 2018-08-07 ENCOUNTER — Other Ambulatory Visit: Payer: Self-pay

## 2018-08-07 MED ORDER — TAMSULOSIN HCL 0.4 MG PO CAPS: 0.4000 mg | ORAL_CAPSULE | Freq: Every day | ORAL | 0 refills | Status: DC

## 2018-08-23 ENCOUNTER — Other Ambulatory Visit: Payer: Self-pay

## 2018-08-23 ENCOUNTER — Ambulatory Visit: Payer: BC Managed Care – PPO | Admitting: Nurse Practitioner

## 2018-08-23 ENCOUNTER — Encounter: Payer: Self-pay | Admitting: Nurse Practitioner

## 2018-08-23 DIAGNOSIS — E7800 Pure hypercholesterolemia, unspecified: Secondary | ICD-10-CM

## 2018-08-23 DIAGNOSIS — E1165 Type 2 diabetes mellitus with hyperglycemia: Secondary | ICD-10-CM

## 2018-08-23 DIAGNOSIS — N400 Enlarged prostate without lower urinary tract symptoms: Secondary | ICD-10-CM | POA: Diagnosis not present

## 2018-08-23 MED ORDER — GLIMEPIRIDE 4 MG PO TABS: 4.0000 mg | ORAL_TABLET | Freq: Two times a day (BID) | ORAL | 5 refills | Status: DC

## 2018-08-23 MED ORDER — ROSUVASTATIN CALCIUM 40 MG PO TABS: 40.0000 mg | ORAL_TABLET | Freq: Every day | ORAL | 3 refills | Status: DC

## 2018-08-23 MED ORDER — DULAGLUTIDE 1.5 MG/0.5ML ~~LOC~~ SOAJ: 1.5000 mg | SUBCUTANEOUS | 5 refills | Status: DC

## 2018-08-23 MED ORDER — FINASTERIDE 5 MG PO TABS: 5.0000 mg | ORAL_TABLET | Freq: Every day | ORAL | 3 refills | Status: DC

## 2018-08-23 NOTE — Progress Notes (Signed)
[facility] [place], [place] [address]  Internal MEDICINE  Telephone Visit  Patient Name: Michael Gross  [id number]  [id number]  Date of Service: 08/24/2018  I connected with the patient at 1:40pm by telephone and verified the patients identity using two identifiers.   I discussed the limitations, risks, security and privacy concerns of performing an evaluation and management service by telephone and the availability of in person appointments. I also discussed with the patient that there may be a patient responsible charge related to the service.  The patient expressed understanding and agrees to proceed.    Chief Complaint  Patient presents with  . Telephone Assessment  . Telephone Screen  . Diabetes  . Quality Metric Gaps    Eye EXam    The patient has been contacted via telephone for follow up visit due to concerns for spread of novel coronavirus. He states that he has had to adjust his diabetic medication a little. Currently taking Metformin 1000mg  in the morning and 750mg  in the evening. He states that he is not sure how his blood sugars are running. He has run out of strips for his glucose monitor. He generally gets these from [place] and does not want to go there unless absolutely necessary due to fear of contracting novel coronavirus. He states that he needs to have refills of many of his medications. He states he feels well and has no concerns or complaints at this time.       Current Medication: Outpatient Encounter Medications as of 08/23/2018  Medication Sig  . BAYER MICROLET LANCETS lancets Use as instructed to check blood sugar 2 times per day dx code E11.65  . canagliflozin (INVOKANA) 300 MG TABS tablet Take 1 tablet (300 mg total) by mouth daily.  . Dulaglutide (TRULICITY) 1.5 DZ/3.2DJ SOPN Inject 1.5 mg into the skin once a week.  . finasteride (PROSCAR) 5 MG tablet Take 1 tablet (5 mg total) by mouth daily.  Marland Kitchen glimepiride (AMARYL) 4 MG  tablet Take 1 tablet (4 mg total) by mouth 2 (two) times daily.  Marland Kitchen glucose blood (BAYER CONTOUR NEXT TEST) test strip Use as instructed to check blood sugar 3 times per day at various different times. dx code E11.65 Bring meter to every visit.  . Lancets 30G MISC Use to check blood sugars 5 times per day  . metFORMIN (GLUCOPHAGE) 500 MG tablet Take 1 tablet (500 mg total) by mouth daily.  . metFORMIN (GLUCOPHAGE-XR) 750 MG 24 hr tablet Take 2 tablets (1,500 mg total) by mouth at bedtime.  . rosuvastatin (CRESTOR) 40 MG tablet Take 1 tablet (40 mg total) by mouth daily.  . tamsulosin (FLOMAX) 0.4 MG CAPS capsule Take 1 capsule (0.4 mg total) by mouth daily.  . [DISCONTINUED] finasteride (PROSCAR) 5 MG tablet Take 1 tablet (5 mg total) by mouth daily.  . [DISCONTINUED] glimepiride (AMARYL) 4 MG tablet Take 1 tablet (4 mg total) by mouth 2 (two) times daily.  . [DISCONTINUED] rosuvastatin (CRESTOR) 40 MG tablet Take 1 tablet (40 mg total) by mouth daily.  . [DISCONTINUED] TRULICITY 1.5 ME/2.6ST SOPN INJECT 1.5 MG INTO THE SKIN EVERY WEEK   No facility-administered encounter medications on file as of 08/23/2018.     Surgical History: Past Surgical History:  Procedure Laterality Date  . HERNIA REPAIR     2003  . KNEE SURGERY     1998  . MOHS SURGERY     2003    Medical History: Past  Medical History:  Diagnosis Date  . Basal cell carcinoma   . BPH (benign prostatic hyperplasia)   . Diabetes ([place])   . Hyperlipidemia     Family History: Family History  Problem Relation Age of Onset  . Heart disease Father   . Prostate cancer Neg Hx   . Kidney cancer Neg Hx   . Bladder Cancer Neg Hx     Social History   Socioeconomic History  . Marital status: Married    Spouse name: Not on file  . Number of children: Not on file  . Years of education: Not on file  . Highest education level: Not on file  Occupational History  . Not on file  Social Needs  . Financial resource strain: Not  on file  . Food insecurity:    Worry: Not on file    Inability: Not on file  . Transportation needs:    Medical: Not on file    Non-medical: Not on file  Tobacco Use  . Smoking status: Never Smoker  . Smokeless tobacco: Never Used  Substance and Sexual Activity  . Alcohol use: Yes    Comment: rare  . Drug use: No  . Sexual activity: Not on file  Lifestyle  . Physical activity:    Days per week: Not on file    Minutes per session: Not on file  . Stress: Not on file  Relationships  . Social connections:    Talks on phone: Not on file    Gets together: Not on file    Attends religious service: Not on file    Active member of club or organization: Not on file    Attends meetings of clubs or organizations: Not on file    Relationship status: Not on file  . Intimate partner violence:    Fear of current or ex partner: Not on file    Emotionally abused: Not on file    Physically abused: Not on file    Forced sexual activity: Not on file  Other Topics Concern  . Not on file  Social History Narrative  . Not on file      Review of Systems  Constitutional: Negative for chills, fatigue and unexpected weight change.  HENT: Negative for congestion, rhinorrhea, sneezing and sore throat.   Respiratory: Negative for cough, chest tightness and shortness of breath.   Cardiovascular: Negative for chest pain and palpitations.  Gastrointestinal: Negative for abdominal pain, constipation, diarrhea, nausea and vomiting.  Endocrine: Negative for cold intolerance, heat intolerance, polydipsia and polyuria.  Musculoskeletal: Negative for arthralgias, back pain, joint swelling and neck pain.  Skin: Negative.  Negative for rash.  Neurological: Negative for tremors, numbness and headaches.  Hematological: Negative for adenopathy. Does not bruise/bleed easily.  Psychiatric/Behavioral: Negative for behavioral problems, dysphoric mood, sleep disturbance and suicidal ideas. The patient is not  nervous/anxious.     Vital Signs: There were no vitals taken for this visit.   Observation/Objective: The patient is alert and oriented. He is in no acute distress.   Assessment/Plan: 1. Uncontrolled type 2 diabetes mellitus with hyperglycemia [facility] Patient should continue all diabetic medication as prescribed. Refills provided today. Will check HgbA1c at his next visit.  - glimepiride (AMARYL) 4 MG tablet; Take 1 tablet (4 mg total) by mouth 2 (two) times daily.  Dispense: 60 tablet; Refill: 5 - Dulaglutide (TRULICITY) 1.5 ZD/6.6YQ SOPN; Inject 1.5 mg into the skin once a week.  Dispense: 4 pen; Refill: 5  2. Pure hypercholesterolemia  Continue crestor 40mg  twice daily. Refills provided today.  - rosuvastatin (CRESTOR) 40 MG tablet; Take 1 tablet (40 mg total) by mouth daily.  Dispense: 30 tablet; Refill: 3  3. Benign prostatic hyperplasia without lower urinary tract symptoms Continue finasteride 5mg  daily. Refills provided today. - finasteride (PROSCAR) 5 MG tablet; Take 1 tablet (5 mg total) by mouth daily.  Dispense: 30 tablet; Refill: 3  General Counseling: maxon kresse understanding of the findings of today's phone visit and agrees with plan of treatment. I have discussed any further diagnostic evaluation that may be needed or ordered today. We also reviewed his medications today. he has been encouraged to call the office with any questions or concerns that should arise related to todays visit.  Diabetes Counseling:  1. Addition of ACE inh/ ARB'S for nephroprotection. Microalbumin is updated  2. Diabetic foot care, prevention of complications. Podiatry consult 3. Exercise and lose weight.  4. Diabetic eye examination, Diabetic eye exam is updated  5. Monitor blood sugar closlely. nutrition counseling.  6. Sign and symptoms of hypoglycemia including shaking sweating,confusion and headaches.  This patient was seen by [place] with Dr Lavera Guise as  a part of collaborative care agreement  Meds ordered this encounter  Medications  . glimepiride (AMARYL) 4 MG tablet    Sig: Take 1 tablet (4 mg total) by mouth 2 (two) times daily.    Dispense:  60 tablet    Refill:  5    Order Specific Question:   Supervising Provider    Answer:   Lavera Guise [[id number]]  . rosuvastatin (CRESTOR) 40 MG tablet    Sig: Take 1 tablet (40 mg total) by mouth daily.    Dispense:  30 tablet    Refill:  3    Order Specific Question:   Supervising Provider    Answer:   Lavera Guise [[id number]]  . finasteride (PROSCAR) 5 MG tablet    Sig: Take 1 tablet (5 mg total) by mouth daily.    Dispense:  30 tablet    Refill:  3    Order Specific Question:   Supervising Provider    Answer:   Lavera Guise [[id number]]  . Dulaglutide (TRULICITY) 1.5 WJ/1.9JY SOPN    Sig: Inject 1.5 mg into the skin once a week.    Dispense:  4 pen    Refill:  5    Order Specific Question:   Supervising Provider    Answer:   Lavera Guise [[id number]]    Time spent: 10 Minutes    Dr Lavera Guise Internal medicine

## 2018-08-24 DIAGNOSIS — N400 Enlarged prostate without lower urinary tract symptoms: Secondary | ICD-10-CM | POA: Insufficient documentation

## 2018-10-10 ENCOUNTER — Other Ambulatory Visit: Payer: Self-pay

## 2018-10-10 ENCOUNTER — Encounter: Payer: Self-pay | Admitting: Nurse Practitioner

## 2018-10-10 ENCOUNTER — Ambulatory Visit: Payer: BC Managed Care – PPO | Admitting: Nurse Practitioner

## 2018-10-10 VITALS — BP 110/78 | HR 90 | Resp 16 | Ht 75.0 in | Wt 200.0 lb

## 2018-10-10 DIAGNOSIS — E1165 Type 2 diabetes mellitus with hyperglycemia: Secondary | ICD-10-CM | POA: Diagnosis not present

## 2018-10-10 DIAGNOSIS — I10 Essential (primary) hypertension: Secondary | ICD-10-CM

## 2018-10-10 LAB — POCT GLYCOSYLATED HEMOGLOBIN (HGB A1C): Hemoglobin A1C: 8.9 % — AB (ref 4.0–5.6)

## 2018-10-10 NOTE — Progress Notes (Signed)
[facility] [place],  [address]  Internal MEDICINE  Office Visit Note  Patient Name: Michael Gross  [id number]  [id number]  Date of Service: 10/16/2018  Chief Complaint  Patient presents with  . Diabetes    6 week follow up     Blood sugars still run elevated for the most part. He is taking all medication as prescribed. Is doing well with limiting carbohydrates and sugar in his diet.   Diabetes  He presents for his follow-up diabetic visit. He has type 2 diabetes mellitus. No MedicAlert identification noted. His disease course has been worsening. There are no hypoglycemic associated symptoms. Pertinent negatives for hypoglycemia include no headaches, nervousness/anxiousness or tremors. There are no diabetic associated symptoms. Pertinent negatives for diabetes include no chest pain, no fatigue, no polydipsia and no polyuria. There are no hypoglycemic complications. Symptoms are stable. There are no diabetic complications. Risk factors for coronary artery disease include dyslipidemia, hypertension, male sex and diabetes mellitus. Current diabetic treatment includes oral agent (triple therapy) (also using trulicity weekly. ). He is compliant with treatment most of the time. His weight is stable. He is following a generally healthy diet. Meal planning includes avoidance of concentrated sweets. He has not had a previous visit with a dietitian. He participates in exercise three times a week. There is no change in his home blood glucose trend. An ACE inhibitor/angiotensin II receptor blocker is not being taken. He does not see a podiatrist.Eye exam is current.       Current Medication: Outpatient Encounter Medications as of 10/10/2018  Medication Sig  . BAYER MICROLET LANCETS lancets Use as instructed to check blood sugar 2 times per day dx code E11.65  . canagliflozin (INVOKANA) 300 MG TABS tablet Take 1 tablet (300 mg total) by mouth daily.  . Dulaglutide  (TRULICITY) 1.5 LN/9.8XQ SOPN Inject 1.5 mg into the skin once a week.  . finasteride (PROSCAR) 5 MG tablet Take 1 tablet (5 mg total) by mouth daily.  Marland Kitchen glimepiride (AMARYL) 4 MG tablet Take 1 tablet (4 mg total) by mouth 2 (two) times daily.  Marland Kitchen glucose blood (BAYER CONTOUR NEXT TEST) test strip Use as instructed to check blood sugar 3 times per day at various different times. dx code E11.65 Bring meter to every visit.  . Lancets 30G MISC Use to check blood sugars 5 times per day  . metFORMIN (GLUCOPHAGE) 500 MG tablet Take 1 tablet (500 mg total) by mouth daily.  . metFORMIN (GLUCOPHAGE-XR) 750 MG 24 hr tablet Take 2 tablets (1,500 mg total) by mouth at bedtime.  . rosuvastatin (CRESTOR) 40 MG tablet Take 1 tablet (40 mg total) by mouth daily.  . tamsulosin (FLOMAX) 0.4 MG CAPS capsule Take 1 capsule (0.4 mg total) by mouth daily.   No facility-administered encounter medications on file as of 10/10/2018.     Surgical History: Past Surgical History:  Procedure Laterality Date  . HERNIA REPAIR     2003  . KNEE SURGERY     1998  . MOHS SURGERY     2003    Medical History: Past Medical History:  Diagnosis Date  . Basal cell carcinoma   . BPH (benign prostatic hyperplasia)   . Diabetes ([place])   . Hyperlipidemia     Family History: Family History  Problem Relation Age of Onset  . Heart disease Father   . Prostate cancer Neg Hx   . Kidney cancer Neg Hx   . Bladder Cancer  Neg Hx     Social History   Socioeconomic History  . Marital status: Married    Spouse name: Not on file  . Number of children: Not on file  . Years of education: Not on file  . Highest education level: Not on file  Occupational History  . Not on file  Social Needs  . Financial resource strain: Not on file  . Food insecurity:    Worry: Not on file    Inability: Not on file  . Transportation needs:    Medical: Not on file    Non-medical: Not on file  Tobacco Use  . Smoking status: Never Smoker  .  Smokeless tobacco: Never Used  Substance and Sexual Activity  . Alcohol use: Yes    Comment: rare  . Drug use: No  . Sexual activity: Not on file  Lifestyle  . Physical activity:    Days per week: Not on file    Minutes per session: Not on file  . Stress: Not on file  Relationships  . Social connections:    Talks on phone: Not on file    Gets together: Not on file    Attends religious service: Not on file    Active member of club or organization: Not on file    Attends meetings of clubs or organizations: Not on file    Relationship status: Not on file  . Intimate partner violence:    Fear of current or ex partner: Not on file    Emotionally abused: Not on file    Physically abused: Not on file    Forced sexual activity: Not on file  Other Topics Concern  . Not on file  Social History Narrative  . Not on file      Review of Systems  Constitutional: Negative for chills, fatigue and unexpected weight change.  HENT: Negative for congestion, rhinorrhea, sneezing and sore throat.   Respiratory: Negative for cough, chest tightness and shortness of breath.   Cardiovascular: Negative for chest pain and palpitations.  Gastrointestinal: Negative for abdominal pain, constipation, diarrhea, nausea and vomiting.  Endocrine: Negative for cold intolerance, heat intolerance, polydipsia and polyuria.  Musculoskeletal: Negative for arthralgias, back pain, joint swelling and neck pain.  Skin: Negative.  Negative for rash.  Neurological: Negative for tremors, numbness and headaches.  Hematological: Negative for adenopathy. Does not bruise/bleed easily.  Psychiatric/Behavioral: Negative for behavioral problems, dysphoric mood, sleep disturbance and suicidal ideas. The patient is not nervous/anxious.     Today's Vitals   10/10/18 1440  BP: 110/78  Pulse: 90  Resp: 16  SpO2: 96%  Weight: 200 lb (90.7 kg)  Height: 6\' 3"  (1.905 m)   Body mass index is 25 kg/m.  Physical Exam Vitals  signs and nursing note reviewed.  Constitutional:      General: He is not in acute distress.    Appearance: Normal appearance. He is well-developed. He is not diaphoretic.  HENT:     Head: Normocephalic and atraumatic.     Nose: Nose normal.     Mouth/Throat:     Pharynx: No oropharyngeal exudate.  Eyes:     Conjunctiva/sclera: Conjunctivae normal.     Pupils: Pupils are equal, round, and reactive to light.  Neck:     Musculoskeletal: Normal range of motion and neck supple.     Thyroid: No thyromegaly.     Vascular: No carotid bruit or JVD.     Trachea: No tracheal deviation.  Cardiovascular:  Rate and Rhythm: Normal rate and regular rhythm.     Heart sounds: Normal heart sounds. No murmur. No friction rub. No gallop.   Pulmonary:     Effort: Pulmonary effort is normal. No respiratory distress.     Breath sounds: Normal breath sounds. No wheezing or rales.  Chest:     Chest wall: No tenderness.  Abdominal:     General: Bowel sounds are normal.     Palpations: Abdomen is soft.     Tenderness: There is no abdominal tenderness.  Musculoskeletal: Normal range of motion.  Lymphadenopathy:     Cervical: No cervical adenopathy.  Skin:    General: Skin is warm and dry.  Neurological:     Mental Status: He is alert and oriented to person, place, and time.     Cranial Nerves: No cranial nerve deficit.  Psychiatric:        Behavior: Behavior normal.        Thought Content: Thought content normal.        Judgment: Judgment normal.   Assessment/Plan:  1. Uncontrolled type 2 diabetes mellitus with hyperglycemia ([facility]) - POCT HgB A1C 8.9 today. Patient now taking 1000mg  metformin in the morning and 1500mg  metfromin in the evenings, amaryl 4mg  twice daily, invokana 300mg  daily, and trulicity 1.5mg  weekly. Patient states that he will do better with taking prescribed doses of metformin as prescribed. Will have HgbA1c checked again at [facility] physical in July.   2. Essential  hypertension Well controlled. Continue bp medication as prescribed    General Counseling: laderius valbuena understanding of the findings of todays visit and agrees with plan of treatment. I have discussed any further diagnostic evaluation that may be needed or ordered today. We also reviewed his medications today. he has been encouraged to call the office with any questions or concerns that should arise related to todays visit.  Diabetes Counseling:  1. Addition of ACE inh/ ARB'S for nephroprotection. Microalbumin is updated  2. Diabetic foot care, prevention of complications. Podiatry consult 3. Exercise and lose weight.  4. Diabetic eye examination, Diabetic eye exam is updated  5. Monitor blood sugar closlely. nutrition counseling.  6. Sign and symptoms of hypoglycemia including shaking sweating,confusion and headaches.  This patient was seen by Leretha Pol FNP Collaboration with Dr Lavera Guise as a part of collaborative care agreement  Orders Placed This Encounter  Procedures  . POCT HgB A1C     Time spent: 25 Minutes      Dr Lavera Guise Internal medicine

## 2018-11-12 ENCOUNTER — Other Ambulatory Visit: Payer: Self-pay | Admitting: Nurse Practitioner

## 2018-11-12 DIAGNOSIS — Z0001 Encounter for general adult medical examination with abnormal findings: Secondary | ICD-10-CM | POA: Diagnosis not present

## 2018-11-12 DIAGNOSIS — I10 Essential (primary) hypertension: Secondary | ICD-10-CM | POA: Diagnosis not present

## 2018-11-12 DIAGNOSIS — E1165 Type 2 diabetes mellitus with hyperglycemia: Secondary | ICD-10-CM | POA: Diagnosis not present

## 2018-11-12 DIAGNOSIS — E782 Mixed hyperlipidemia: Secondary | ICD-10-CM | POA: Diagnosis not present

## 2018-11-13 LAB — CBC
Hematocrit: 50 % (ref 37.5–51.0)
Hemoglobin: 16.9 g/dL (ref 13.0–17.7)
MCH: 29.5 pg (ref 26.6–33.0)
MCHC: 33.8 g/dL (ref 31.5–35.7)
MCV: 87 fL (ref 79–97)
Platelets: 181 10*3/uL (ref 150–450)
RBC: 5.72 x10E6/uL (ref 4.14–5.80)
RDW: 13 % (ref 11.6–15.4)
WBC: 6 10*3/uL (ref 3.4–10.8)

## 2018-11-13 LAB — COMPREHENSIVE METABOLIC PANEL
ALT: 24 IU/L (ref 0–44)
AST: 23 IU/L (ref 0–40)
Albumin/Globulin Ratio: 1.8 (ref 1.2–2.2)
Albumin: 4.3 g/dL (ref 3.8–4.8)
Alkaline Phosphatase: 74 IU/L (ref 39–117)
BUN/Creatinine Ratio: 22 (ref 10–24)
BUN: 17 mg/dL (ref 8–27)
Bilirubin Total: 0.4 mg/dL (ref 0.0–1.2)
CO2: 21 mmol/L (ref 20–29)
Calcium: 9.6 mg/dL (ref 8.6–10.2)
Chloride: 101 mmol/L (ref 96–106)
Creatinine, Ser: 0.76 mg/dL (ref 0.76–1.27)
GFR calc Af Amer: 112 mL/min/{1.73_m2} (ref >59)
GFR calc non Af Amer: 97 mL/min/{1.73_m2} (ref >59)
Globulin, Total: 2.4 g/dL (ref 1.5–4.5)
Glucose: 145 mg/dL — ABNORMAL HIGH (ref 65–99)
Potassium: 4.4 mmol/L (ref 3.5–5.2)
Sodium: 140 mmol/L (ref 134–144)
Total Protein: 6.7 g/dL (ref 6.0–8.5)

## 2018-11-13 LAB — LIPID PANEL W/O CHOL/HDL RATIO
Cholesterol, Total: 186 mg/dL (ref 100–199)
HDL: 49 mg/dL (ref >39)
LDL Calculated: 119 mg/dL — ABNORMAL HIGH (ref 0–99)
Triglycerides: 90 mg/dL (ref 0–149)
VLDL Cholesterol Cal: 18 mg/dL (ref 5–40)

## 2018-11-13 LAB — T4, FREE: Free T4: 1.39 ng/dL (ref 0.82–1.77)

## 2018-11-13 LAB — PSA: Prostate Specific Ag, Serum: 1.9 ng/mL (ref 0.0–4.0)

## 2018-11-13 LAB — TSH: TSH: 0.865 u[IU]/mL (ref 0.450–4.500)

## 2018-11-21 ENCOUNTER — Ambulatory Visit: Payer: BC Managed Care – PPO | Admitting: Nurse Practitioner

## 2018-11-21 ENCOUNTER — Other Ambulatory Visit: Payer: Self-pay

## 2018-11-21 ENCOUNTER — Encounter: Payer: Self-pay | Admitting: Nurse Practitioner

## 2018-11-21 VITALS — BP 120/78 | HR 86 | Resp 16 | Ht 75.0 in | Wt 195.4 lb

## 2018-11-21 DIAGNOSIS — R300 Dysuria: Secondary | ICD-10-CM | POA: Diagnosis not present

## 2018-11-21 DIAGNOSIS — E7800 Pure hypercholesterolemia, unspecified: Secondary | ICD-10-CM

## 2018-11-21 DIAGNOSIS — Z0001 Encounter for general adult medical examination with abnormal findings: Secondary | ICD-10-CM

## 2018-11-21 DIAGNOSIS — E1165 Type 2 diabetes mellitus with hyperglycemia: Secondary | ICD-10-CM

## 2018-11-21 LAB — POCT GLYCOSYLATED HEMOGLOBIN (HGB A1C): Hemoglobin A1C: 8.7 % — AB (ref 4.0–5.6)

## 2018-11-21 NOTE — Progress Notes (Signed)
[facility] [place], [place] [address]  Internal MEDICINE  Office Visit Note  Patient Name: Michael Gross  [id number]  [id number]  Date of Service: 11/22/2018  Chief Complaint  Patient presents with  . Annual Exam    pt has paperwork to be filled out  . Diabetes    A1C  . Hyperlipidemia  . Quality Metric Gaps    foot exam  . Labs Only    review labs     The patient is here for health maintenance exam.  Patient is diabetic. Currently taking trulicity 1.5mg  weekly, amaryl 4mg  twice daily, and metformin 750mg  every day and metformin 500mg  every evening. The patient has been doing better. Compliant with all medications and treatments. HgbA1c has already improved from 8.9 to 8.7 in just the past few weeks. He feels good. Blood pressure is well managed. Labs were done prior to this visit. Renal and hepatic functions were normal. Blood count and thyroid panel were normal. There was very mid elevation of LDL, but cholesterol was otherwise normal. He has no complaints today.   Pt is here for routine health maintenance examination  Current Medication: Outpatient Encounter Medications as of 11/21/2018  Medication Sig  . BAYER MICROLET LANCETS lancets Use as instructed to check blood sugar 2 times per day dx code E11.65  . canagliflozin (INVOKANA) 300 MG TABS tablet Take 1 tablet (300 mg total) by mouth daily.  . Dulaglutide (TRULICITY) 1.5 VE/9.3YB SOPN Inject 1.5 mg into the skin once a week.  . finasteride (PROSCAR) 5 MG tablet Take 1 tablet (5 mg total) by mouth daily.  Marland Kitchen glimepiride (AMARYL) 4 MG tablet Take 1 tablet (4 mg total) by mouth 2 (two) times daily.  Marland Kitchen glucose blood (BAYER CONTOUR NEXT TEST) test strip Use as instructed to check blood sugar 3 times per day at various different times. dx code E11.65 Bring meter to every visit.  . Lancets 30G MISC Use to check blood sugars 5 times per day  . metFORMIN (GLUCOPHAGE) 500 MG tablet Take 1 tablet (500  mg total) by mouth daily.  . metFORMIN (GLUCOPHAGE-XR) 750 MG 24 hr tablet Take 2 tablets (1,500 mg total) by mouth at bedtime.  . rosuvastatin (CRESTOR) 40 MG tablet Take 1 tablet (40 mg total) by mouth daily.  . tamsulosin (FLOMAX) 0.4 MG CAPS capsule Take 1 capsule (0.4 mg total) by mouth daily.   No facility-administered encounter medications on file as of 11/21/2018.     Surgical History: Past Surgical History:  Procedure Laterality Date  . HERNIA REPAIR     2003  . KNEE SURGERY     1998  . MOHS SURGERY     2003    Medical History: Past Medical History:  Diagnosis Date  . Basal cell carcinoma   . BPH (benign prostatic hyperplasia)   . Diabetes ([place])   . Hyperlipidemia     Family History: Family History  Problem Relation Age of Onset  . Heart disease Father   . Prostate cancer Neg Hx   . Kidney cancer Neg Hx   . Bladder Cancer Neg Hx       Review of Systems  Constitutional: Negative for chills, fatigue and unexpected weight change.  HENT: Negative for congestion, rhinorrhea, sneezing and sore throat.   Respiratory: Negative for cough, chest tightness and shortness of breath.   Cardiovascular: Negative for chest pain and palpitations.  Gastrointestinal: Negative for abdominal pain, constipation, diarrhea, nausea and vomiting.  Endocrine:  Negative for cold intolerance, heat intolerance, polydipsia and polyuria.       Improved blood sugars   Genitourinary: Negative for dysuria, frequency, hematuria and urgency.  Musculoskeletal: Negative for arthralgias, back pain, joint swelling and neck pain.  Skin: Negative for rash.  Allergic/Immunologic: Negative for environmental allergies.  Neurological: Negative for dizziness, tremors, numbness and headaches.  Hematological: Negative for adenopathy. Does not bruise/bleed easily.  Psychiatric/Behavioral: Negative for behavioral problems, dysphoric mood, sleep disturbance and suicidal ideas. The patient is not  nervous/anxious.      Today's Vitals   11/21/18 1544  BP: 120/78  Pulse: 86  Resp: 16  SpO2: 97%  Weight: 195 lb 6.4 oz (88.6 kg)  Height: 6\' 3"  (1.905 m)   Body mass index is 24.42 kg/m.  Physical Exam Vitals signs and nursing note reviewed.  Constitutional:      General: He is not in acute distress.    Appearance: Normal appearance. He is well-developed. He is not diaphoretic.  HENT:     Head: Normocephalic and atraumatic.     Nose: Nose normal.     Mouth/Throat:     Pharynx: No oropharyngeal exudate.  Eyes:     Conjunctiva/sclera: Conjunctivae normal.     Pupils: Pupils are equal, round, and reactive to light.  Neck:     Musculoskeletal: Normal range of motion and neck supple.     Thyroid: No thyromegaly.     Vascular: No carotid bruit or JVD.     Trachea: No tracheal deviation.  Cardiovascular:     Rate and Rhythm: Normal rate and regular rhythm.     Pulses: Normal pulses.          Dorsalis pedis pulses are 2+ on the right side and 2+ on the left side.       Posterior tibial pulses are 2+ on the right side and 2+ on the left side.     Heart sounds: Normal heart sounds. No murmur. No friction rub. No gallop.   Pulmonary:     Effort: Pulmonary effort is normal. No respiratory distress.     Breath sounds: Normal breath sounds. No wheezing or rales.  Chest:     Chest wall: No tenderness.  Abdominal:     General: Bowel sounds are normal.     Palpations: Abdomen is soft.     Tenderness: There is no abdominal tenderness.  Musculoskeletal: Normal range of motion.     Right foot: Normal range of motion. No deformity.     Left foot: Normal range of motion. No deformity.  Feet:     Right foot:     Protective Sensation: 10 sites tested. 10 sites sensed.     Skin integrity: Skin integrity normal.     Toenail Condition: Right toenails are normal.     Left foot:     Protective Sensation: 10 sites tested. 10 sites sensed.     Skin integrity: Skin integrity normal.      Toenail Condition: Left toenails are normal.  Lymphadenopathy:     Cervical: No cervical adenopathy.  Skin:    General: Skin is warm and dry.     Capillary Refill: Capillary refill takes less than 2 seconds.  Neurological:     Mental Status: He is alert and oriented to person, place, and time.     Cranial Nerves: No cranial nerve deficit.  Psychiatric:        Behavior: Behavior normal.        Thought Content: Thought  content normal.        Judgment: Judgment normal.    LABS: Recent Results (from the past 2160 hour(s))  POCT HgB A1C     Status: Abnormal   Collection Time: 10/10/18  2:54 PM  Result Value Ref Range   Hemoglobin A1C 8.9 (A) 4.0 - 5.6 %   HbA1c POC (<> result, manual entry)     HbA1c, POC (prediabetic range)     HbA1c, POC (controlled diabetic range)    Comprehensive metabolic panel     Status: Abnormal   Collection Time: 11/12/18  8:21 AM  Result Value Ref Range   Glucose 145 (H) 65 - 99 mg/dL   BUN 17 8 - 27 mg/dL   Creatinine, Ser 0.76 0.76 - 1.27 mg/dL   GFR calc non Af Amer 97 >59 mL/min/1.73   GFR calc Af Amer 112 >59 mL/min/1.73   BUN/Creatinine Ratio 22 10 - 24   Sodium 140 134 - 144 mmol/L   Potassium 4.4 3.5 - 5.2 mmol/L   Chloride 101 96 - 106 mmol/L   CO2 21 20 - 29 mmol/L   Calcium 9.6 8.6 - 10.2 mg/dL   Total Protein 6.7 6.0 - 8.5 g/dL   Albumin 4.3 3.8 - 4.8 g/dL   Globulin, Total 2.4 1.5 - 4.5 g/dL   Albumin/Globulin Ratio 1.8 1.2 - 2.2   Bilirubin Total 0.4 0.0 - 1.2 mg/dL   Alkaline Phosphatase 74 39 - 117 IU/L   AST 23 0 - 40 IU/L   ALT 24 0 - 44 IU/L  CBC     Status: None   Collection Time: 11/12/18  8:21 AM  Result Value Ref Range   WBC 6.0 3.4 - 10.8 x10E3/uL   RBC 5.72 4.14 - 5.80 x10E6/uL   Hemoglobin 16.9 13.0 - 17.7 g/dL   Hematocrit 50.0 37.5 - 51.0 %   MCV 87 79 - 97 fL   MCH 29.5 26.6 - 33.0 pg   MCHC 33.8 31.5 - 35.7 g/dL   RDW 13.0 11.6 - 15.4 %   Platelets 181 150 - 450 x10E3/uL  Lipid Panel w/o Chol/HDL Ratio      Status: Abnormal   Collection Time: 11/12/18  8:21 AM  Result Value Ref Range   Cholesterol, Total 186 100 - 199 mg/dL   Triglycerides 90 0 - 149 mg/dL   HDL 49 >39 mg/dL   VLDL Cholesterol Cal 18 5 - 40 mg/dL   LDL Calculated 119 (H) 0 - 99 mg/dL  T4, free     Status: None   Collection Time: 11/12/18  8:21 AM  Result Value Ref Range   Free T4 1.39 0.82 - 1.77 ng/dL  TSH     Status: None   Collection Time: 11/12/18  8:21 AM  Result Value Ref Range   TSH 0.865 0.450 - 4.500 uIU/mL  PSA     Status: None   Collection Time: 11/12/18  8:21 AM  Result Value Ref Range   Prostate Specific Ag, Serum 1.9 0.0 - 4.0 ng/mL    Comment: Roche ECLIA methodology. According to the American Urological Association, Serum PSA should decrease and remain at undetectable levels after radical prostatectomy. The AUA defines biochemical recurrence as an initial PSA value 0.2 ng/mL or greater followed by a subsequent confirmatory PSA value 0.2 ng/mL or greater. Values obtained with different assay methods or kits cannot be used interchangeably. Results cannot be interpreted as absolute evidence of the presence or absence of malignant disease.  UA/M w/rflx Culture, Routine     Status: Abnormal   Collection Time: 11/21/18  3:30 PM   Specimen: Urine   URINE  Result Value Ref Range   Specific Gravity, UA      >=1.030 (A) 1.005 - 1.030   pH, UA 5.0 5.0 - 7.5   Color, UA Yellow Yellow   Appearance Ur Clear Clear   Leukocytes,UA Negative Negative   Protein,UA Negative Negative/Trace   Glucose, UA 3+ (A) Negative   Ketones, UA 2+ (A) Negative   RBC, UA Negative Negative   Bilirubin, UA Negative Negative   Urobilinogen, Ur 0.2 0.2 - 1.0 mg/dL   Nitrite, UA Negative Negative   Microscopic Examination Comment     Comment: Microscopic follows if indicated.   Microscopic Examination See below:     Comment: Microscopic was indicated and was performed.   Urinalysis Reflex Comment     Comment: This specimen  will not reflex to a Urine Culture.  Microscopic Examination     Status: Abnormal   Collection Time: 11/21/18  3:30 PM   URINE  Result Value Ref Range   WBC, UA 0-5 0 - 5 /hpf   RBC 0-2 0 - 2 /hpf   Epithelial Cells (non renal) 0-10 0 - 10 /hpf   Casts None seen None seen /lpf   Crystals Present (A) N/A   Crystal Type Calcium Oxalate N/A   Mucus, UA Present Not Estab.   Bacteria, UA Few None seen/Few  POCT HgB A1C     Status: Abnormal   Collection Time: 11/21/18  4:49 PM  Result Value Ref Range   Hemoglobin A1C 8.7 (A) 4.0 - 5.6 %   HbA1c POC (<> result, manual entry)     HbA1c, POC (prediabetic range)     HbA1c, POC (controlled diabetic range)     Assessment/Plan: 1. Encounter for general adult medical examination with abnormal findings Annual health maintenance exam today.   2. Uncontrolled type 2 diabetes mellitus with hyperglycemia (HCC) - POCT HgB A1C  8.1 today. improved from 8.9 three weeks ago. Advised he increase metformin 750mg  to twice daily. Continue amaryl and trulicity as prescribed. Patient compliant with treatment plan and is making diet and lifestyle changes to help better manage diabetes.   3. Pure hypercholesterolemia Well controlled. Continue crestor as prescribed   4. Dysuria - UA/M w/rflx Culture, Routine  General Counseling: enoch moffa understanding of the findings of todays visit and agrees with plan of treatment. I have discussed any further diagnostic evaluation that may be needed or ordered today. We also reviewed his medications today. he has been encouraged to call the office with any questions or concerns that should arise related to todays visit.    Counseling:   Diabetes Counseling:  1. Addition of ACE inh/ ARB'S for nephroprotection. Microalbumin is updated  2. Diabetic foot care, prevention of complications. Podiatry consult 3. Exercise and lose weight.  4. Diabetic eye examination, Diabetic eye exam is updated  5. Monitor blood  sugar closlely. nutrition counseling.  6. Sign and symptoms of hypoglycemia including shaking sweating,confusion and headaches.  This patient was seen by Leretha Pol FNP Collaboration with Dr Lavera Guise as a part of collaborative care agreement   Orders Placed This Encounter  Procedures  . Microscopic Examination  . UA/M w/rflx Culture, Routine  . POCT HgB A1C      Time spent: Betsy Layne, MD  Internal Medicine

## 2018-11-22 DIAGNOSIS — R300 Dysuria: Secondary | ICD-10-CM | POA: Insufficient documentation

## 2018-11-22 DIAGNOSIS — Z0001 Encounter for general adult medical examination with abnormal findings: Secondary | ICD-10-CM | POA: Insufficient documentation

## 2018-11-22 LAB — MICROSCOPIC EXAMINATION: Casts: NONE SEEN /lpf

## 2018-11-22 LAB — UA/M W/RFLX CULTURE, ROUTINE
Bilirubin, UA: NEGATIVE
Leukocytes,UA: NEGATIVE
Nitrite, UA: NEGATIVE
Protein,UA: NEGATIVE
RBC, UA: NEGATIVE
Specific Gravity, UA: >=1.03 — AB (ref 1.005–1.030)
Urobilinogen, Ur: 0.2 mg/dL (ref 0.2–1.0)
pH, UA: 5 (ref 5.0–7.5)

## 2018-12-02 ENCOUNTER — Other Ambulatory Visit: Payer: Self-pay

## 2018-12-02 MED ORDER — METFORMIN HCL 500 MG PO TABS: 500.0000 mg | ORAL_TABLET | Freq: Every day | ORAL | 5 refills | Status: DC

## 2018-12-10 DIAGNOSIS — S0501XA Injury of conjunctiva and corneal abrasion without foreign body, right eye, initial encounter: Secondary | ICD-10-CM | POA: Diagnosis not present

## 2018-12-13 ENCOUNTER — Other Ambulatory Visit: Payer: Self-pay

## 2018-12-13 MED ORDER — CANAGLIFLOZIN 300 MG PO TABS: 300.0000 mg | ORAL_TABLET | Freq: Every day | ORAL | 5 refills | Status: DC

## 2018-12-16 ENCOUNTER — Other Ambulatory Visit: Payer: Self-pay | Admitting: Nurse Practitioner

## 2018-12-16 MED ORDER — METFORMIN HCL ER 750 MG PO TB24: 1500.0000 mg | ORAL_TABLET | Freq: Every day | ORAL | 2 refills | Status: DC

## 2019-01-06 ENCOUNTER — Other Ambulatory Visit: Payer: Self-pay

## 2019-01-06 DIAGNOSIS — E7800 Pure hypercholesterolemia, unspecified: Secondary | ICD-10-CM

## 2019-01-06 MED ORDER — ROSUVASTATIN CALCIUM 40 MG PO TABS: 40.0000 mg | ORAL_TABLET | Freq: Every day | ORAL | 3 refills | Status: DC

## 2019-02-17 ENCOUNTER — Other Ambulatory Visit: Payer: Self-pay

## 2019-02-17 MED ORDER — TAMSULOSIN HCL 0.4 MG PO CAPS: 0.4000 mg | ORAL_CAPSULE | Freq: Every day | ORAL | 0 refills | Status: DC

## 2019-02-20 ENCOUNTER — Ambulatory Visit: Payer: Self-pay | Admitting: Nurse Practitioner

## 2019-04-09 ENCOUNTER — Other Ambulatory Visit: Payer: Self-pay

## 2019-04-09 DIAGNOSIS — E1165 Type 2 diabetes mellitus with hyperglycemia: Secondary | ICD-10-CM

## 2019-04-09 MED ORDER — TRULICITY 1.5 MG/0.5ML ~~LOC~~ SOAJ: 1.5000 mg | SUBCUTANEOUS | 5 refills | Status: DC

## 2019-04-10 ENCOUNTER — Other Ambulatory Visit: Payer: Self-pay

## 2019-04-10 ENCOUNTER — Emergency Department: Payer: BC Managed Care – PPO

## 2019-04-10 ENCOUNTER — Encounter: Payer: Self-pay | Admitting: Emergency Medicine

## 2019-04-10 ENCOUNTER — Emergency Department
Admission: EM | Admit: 2019-04-10 | Discharge: 2019-04-10 | Disposition: A | Discharge status: Home / Self Care | Payer: BC Managed Care – PPO | Attending: Emergency Medicine | Admitting: Emergency Medicine

## 2019-04-10 ENCOUNTER — Encounter: Payer: Self-pay | Admitting: Internal Medicine

## 2019-04-10 ENCOUNTER — Ambulatory Visit: Payer: BC Managed Care – PPO | Admitting: Internal Medicine

## 2019-04-10 VITALS — BP 129/82 | HR 64 | Temp 97.5°F | Resp 16 | Ht 75.0 in | Wt 197.0 lb

## 2019-04-10 DIAGNOSIS — R1013 Epigastric pain: Secondary | ICD-10-CM | POA: Diagnosis not present

## 2019-04-10 DIAGNOSIS — K828 Other specified diseases of gallbladder: Secondary | ICD-10-CM | POA: Diagnosis not present

## 2019-04-10 DIAGNOSIS — R109 Unspecified abdominal pain: Secondary | ICD-10-CM

## 2019-04-10 DIAGNOSIS — Z79899 Other long term (current) drug therapy: Secondary | ICD-10-CM | POA: Insufficient documentation

## 2019-04-10 DIAGNOSIS — I10 Essential (primary) hypertension: Secondary | ICD-10-CM | POA: Insufficient documentation

## 2019-04-10 DIAGNOSIS — E119 Type 2 diabetes mellitus without complications: Secondary | ICD-10-CM | POA: Diagnosis not present

## 2019-04-10 DIAGNOSIS — R0789 Other chest pain: Secondary | ICD-10-CM

## 2019-04-10 DIAGNOSIS — R079 Chest pain, unspecified: Secondary | ICD-10-CM | POA: Diagnosis not present

## 2019-04-10 DIAGNOSIS — E1165 Type 2 diabetes mellitus with hyperglycemia: Secondary | ICD-10-CM

## 2019-04-10 DIAGNOSIS — Z7984 Long term (current) use of oral hypoglycemic drugs: Secondary | ICD-10-CM | POA: Diagnosis not present

## 2019-04-10 DIAGNOSIS — R1011 Right upper quadrant pain: Secondary | ICD-10-CM

## 2019-04-10 LAB — BASIC METABOLIC PANEL
Anion gap: 13 (ref 5–15)
BUN: 19 mg/dL (ref 8–23)
CO2: 18 mmol/L — ABNORMAL LOW (ref 22–32)
Calcium: 9 mg/dL (ref 8.9–10.3)
Chloride: 106 mmol/L (ref 98–111)
Creatinine, Ser: 0.68 mg/dL (ref 0.61–1.24)
GFR calc Af Amer: >60 mL/min (ref >60)
GFR calc non Af Amer: >60 mL/min (ref >60)
Glucose, Bld: 106 mg/dL — ABNORMAL HIGH (ref 70–99)
Potassium: 3.9 mmol/L (ref 3.5–5.1)
Sodium: 137 mmol/L (ref 135–145)

## 2019-04-10 LAB — TROPONIN I (HIGH SENSITIVITY)
Troponin I (High Sensitivity): 6 ng/L (ref <18)
Troponin I (High Sensitivity): 5 ng/L (ref <18)

## 2019-04-10 LAB — POCT GLYCOSYLATED HEMOGLOBIN (HGB A1C): Hemoglobin A1C: 8.9 % — AB (ref 4.0–5.6)

## 2019-04-10 LAB — HEPATIC FUNCTION PANEL
ALT: 26 U/L (ref 0–44)
AST: 21 U/L (ref 15–41)
Albumin: 4.2 g/dL (ref 3.5–5.0)
Alkaline Phosphatase: 71 U/L (ref 38–126)
Bilirubin, Direct: <0.1 mg/dL (ref 0.0–0.2)
Total Bilirubin: 1.2 mg/dL (ref 0.3–1.2)
Total Protein: 7.3 g/dL (ref 6.5–8.1)

## 2019-04-10 LAB — CBC
HCT: 50.7 % (ref 39.0–52.0)
Hemoglobin: 16.8 g/dL (ref 13.0–17.0)
MCH: 29.6 pg (ref 26.0–34.0)
MCHC: 33.1 g/dL (ref 30.0–36.0)
MCV: 89.4 fL (ref 80.0–100.0)
Platelets: 184 10*3/uL (ref 150–400)
RBC: 5.67 MIL/uL (ref 4.22–5.81)
RDW: 12.7 % (ref 11.5–15.5)
WBC: 7 10*3/uL (ref 4.0–10.5)
nRBC: 0 % (ref 0.0–0.2)

## 2019-04-10 LAB — POCT GLUCOSE (DEVICE FOR HOME USE): Glucose Fasting, POC: 126 mg/dL — AB (ref 70–99)

## 2019-04-10 LAB — LIPASE, BLOOD: Lipase: 32 U/L (ref 11–51)

## 2019-04-10 LAB — FIBRIN DERIVATIVES D-DIMER (ARMC ONLY): Fibrin derivatives D-dimer (ARMC): 278.04 ng/mL (FEU) (ref 0.00–499.00)

## 2019-04-10 MED ORDER — IBUPROFEN 600 MG PO TABS
600.0000 mg | ORAL_TABLET | ORAL | Status: AC
  Administered 2019-04-10: 600 mg via ORAL
  Filled 2019-04-10: qty 1

## 2019-04-10 NOTE — Progress Notes (Signed)
[facility] [place], [place] [address]  Internal MEDICINE  Office Visit Note  Patient Name: Michael Gross  [id number]  [id number]  Date of Service: 04/10/2019  Chief Complaint  Patient presents with  . Back Pain    upper and pain in right side of chest going for last 3 days  . Quality Metric Gaps    eye exam    HPI Pt is here with c/o right sided chest and upper abdominal pain. He looks anxious and uncomfortable, there is no association with food intake or exertion. Denies any nausea and vomiting. He grades the pain as high 6/10. Strong family history of CAD. His Dm is not well controlled either. Last Hg A1c is above 8. 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 Mild radion to right shoulder and upper back. Deep breathing makes him conscious about the pain. No cough or fever, slight sob    Current Medication: Outpatient Encounter Medications as of 04/10/2019  Medication Sig  . BAYER MICROLET LANCETS lancets Use as instructed to check blood sugar 2 times per day dx code E11.65  . canagliflozin (INVOKANA) 300 MG TABS tablet Take 1 tablet (300 mg total) by mouth daily.  . Dulaglutide (TRULICITY) 1.5 0000000 SOPN Inject 1.5 mg into the skin once a week.  . finasteride (PROSCAR) 5 MG tablet Take 1 tablet (5 mg total) by mouth daily.  Marland Kitchen glimepiride (AMARYL) 4 MG tablet Take 1 tablet (4 mg total) by mouth 2 (two) times daily.  Marland Kitchen glucose blood (BAYER CONTOUR NEXT TEST) test strip Use as instructed to check blood sugar 3 times per day at various different times. dx code E11.65 Bring meter to every visit.  . Lancets 30G MISC Use to check blood sugars 5 times per day  . metFORMIN (GLUCOPHAGE) 500 MG tablet Take 1 tablet (500 mg total) by mouth daily.  . metFORMIN (GLUCOPHAGE-XR) 750 MG 24 hr tablet Take 2 tablets (1,500 mg total) by mouth at bedtime.  . rosuvastatin (CRESTOR) 40 MG tablet Take 1 tablet (40 mg total) by  mouth daily.  . tamsulosin (FLOMAX) 0.4 MG CAPS capsule Take 1 capsule (0.4 mg total) by mouth daily.   No facility-administered encounter medications on file as of 04/10/2019.     Surgical History: Past Surgical History:  Procedure Laterality Date  . HERNIA REPAIR     2003  . KNEE SURGERY     1998  . MOHS SURGERY     2003    Medical History: Past Medical History:  Diagnosis Date  . Basal cell carcinoma   . BPH (benign prostatic hyperplasia)   . Diabetes ([place])   . Hyperlipidemia     Family History: Family History  Problem Relation Age of Onset  . Heart disease Father   . Prostate cancer Neg Hx   . Kidney cancer Neg Hx   . Bladder Cancer Neg Hx     Social History   Socioeconomic History  . Marital status: Married    Spouse name: Not on file  . Number of children: Not on file  . Years of education: Not on file  . Highest education level: Not on file  Occupational History  . Not on file  Social Needs  . Financial resource strain: Not on file  . Food insecurity    Worry: Not on file    Inability: Not on file  . Transportation needs  Medical: Not on file    Non-medical: Not on file  Tobacco Use  . Smoking status: Never Smoker  . Smokeless tobacco: Never Used  Substance and Sexual Activity  . Alcohol use: Yes    Comment: rare  . Drug use: No  . Sexual activity: Not on file  Lifestyle  . Physical activity    Days per week: Not on file    Minutes per session: Not on file  . Stress: Not on file  Relationships  . Social Herbalist on phone: Not on file    Gets together: Not on file    Attends religious service: Not on file    Active member of club or organization: Not on file    Attends meetings of clubs or organizations: Not on file    Relationship status: Not on file  . Intimate partner violence    Fear of current or ex partner: Not on file    Emotionally abused: Not on file    Physically abused: Not on file    Forced sexual activity:  Not on file  Other Topics Concern  . Not on file  Social History Narrative  . Not on file    Review of Systems  Constitutional: Negative for chills, fatigue and unexpected weight change.  HENT: Positive for postnasal drip. Negative for congestion, rhinorrhea, sneezing and sore throat.   Eyes: Negative for redness.  Respiratory: Positive for shortness of breath. Negative for cough and chest tightness.   Cardiovascular: Positive for chest pain. Negative for palpitations.  Gastrointestinal: Positive for abdominal pain. Negative for constipation, diarrhea, nausea and vomiting.  Genitourinary: Negative for dysuria and frequency.  Musculoskeletal: Negative for arthralgias, back pain, joint swelling and neck pain.  Skin: Negative for rash.  Neurological: Negative.  Negative for tremors and numbness.  Hematological: Negative for adenopathy. Does not bruise/bleed easily.  Psychiatric/Behavioral: Negative for behavioral problems (Depression), sleep disturbance and suicidal ideas. The patient is not nervous/anxious.     Vital Signs: BP 129/82   Pulse 64   Temp (!) 97.5 F (36.4 C)   Resp 16   Ht 6\' 3"  (1.905 m)   Wt 197 lb (89.4 kg)   SpO2 96%   BMI 24.62 kg/m    Physical Exam Constitutional:      General: He is not in acute distress.    Appearance: He is well-developed. He is not diaphoretic.  HENT:     Head: Normocephalic and atraumatic.     Mouth/Throat:     Pharynx: No oropharyngeal exudate.  Eyes:     Pupils: Pupils are equal, round, and reactive to light.  Neck:     Musculoskeletal: Normal range of motion and neck supple.     Thyroid: No thyromegaly.     Vascular: No JVD.     Trachea: No tracheal deviation.  Cardiovascular:     Rate and Rhythm: Normal rate and regular rhythm.     Heart sounds: Normal heart sounds. No murmur. No friction rub. No gallop.   Pulmonary:     Effort: Pulmonary effort is normal. No respiratory distress.     Breath sounds: No wheezing or  rales.  Chest:     Chest wall: No tenderness.  Abdominal:     General: Bowel sounds are normal.     Palpations: Abdomen is soft.     Tenderness: There is abdominal tenderness.     Comments: Pt is tender on right upper quadrant and epigastric area, no rebound  noticed,  Musculoskeletal: Normal range of motion.  Lymphadenopathy:     Cervical: No cervical adenopathy.  Skin:    General: Skin is warm and dry.  Neurological:     Mental Status: He is alert and oriented to person, place, and time.     Cranial Nerves: No cranial nerve deficit.  Psychiatric:        Behavior: Behavior normal.        Thought Content: Thought content normal.        Judgment: Judgment normal.     Comments: Anxious     Assessment/Plan: 1. Uncontrolled type 2 diabetes mellitus with hyperglycemia (HCC) - Stop Trulicity due to risk of pancreatitis, will had Basalgar 16 units with supper  - POCT HgB A1C - POCT Glucose (Device for Home Use)  2. Other chest pain - Multifactorial. Risk factors for CAD present - EKG 12-Lead ( abnormal). 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 Right upper quadrant abdominal pain - Pt will need to be looked at for acute pancreatitis/ cholecystitis /PE or MI   General Counseling: wyndham rajchel understanding of the findings of todays visit and agrees with plan of treatment. I have discussed any further diagnostic evaluation that may be needed or ordered today. We also reviewed his medications today. he has been encouraged to call the office with any questions or concerns that should arise related to todays visit.  Pt is instructed to hold Glimepiride, Trulicity and metformin for now   Orders Placed This Encounter  Procedures  . POCT HgB A1C  . POCT Glucose (Device for Home Use)  . EKG 12-Lead    Time spent:25Minutes  Dr Lavera Guise Internal medicine

## 2019-04-10 NOTE — ED Provider Notes (Signed)
[facility] Emergency Department Provider Note   ____________________________________________   None    (approximate)  I have reviewed the triage vital signs and the nursing notes.   HISTORY  Chief Complaint Chest Pain    HPI Michael Gross is a 63 y.o. male referred for evaluation for right upper abdominal versus right lower chest wall pain  Patient reports for at least 3 probably 5 days now has been experiencing a discomfort over his right lower chest.  Reports it seems a little more present to be takes of breath but it does not really hurt.  Right now is just a little bit achy.  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  No fevers or chills.  No cough.  Has not had symptoms like this before.  Denies known cardiac history.  Reports his primary sent in for evaluation thinking he needed to have evaluation to make sure there is no problem with something like his gallbladder.  Denies shortness of breath, reports his breathing seems no different than normal to him.  No left-sided chest pain.  He is not had any sweating or radiation no associated nausea.  Nothing that radiates to an arm neck or back.  Pain is located primarily over the right lower rib cage  No history of blood clots.  No recent long trips or travel.  No history of trauma.  No leg swelling.  Does not smoke.   Past Medical History:  Diagnosis Date  . Basal cell carcinoma   . BPH (benign prostatic hyperplasia)   . Diabetes ([place])   . Hyperlipidemia     Patient Active Problem List   Diagnosis Date Noted  . Encounter for general adult medical examination with abnormal findings 11/22/2018  . Dysuria 11/22/2018  . Benign prostatic hyperplasia without lower urinary tract symptoms 08/24/2018  . Essential hypertension 01/15/2013  . Pure hypercholesterolemia 01/13/2013  . Type II diabetes mellitus,  uncontrolled ([place]) 12/26/2012    Past Surgical History:  Procedure Laterality Date  . HERNIA REPAIR     2003  . KNEE SURGERY     1998  . MOHS SURGERY     2003    Prior to Admission medications   Medication Sig Start Date End Date Taking? Authorizing Provider  BAYER MICROLET LANCETS lancets Use as instructed to check blood sugar 2 times per day dx code E11.65 04/19/15   Elayne Snare, MD  canagliflozin (INVOKANA) 300 MG TABS tablet Take 1 tablet (300 mg total) by mouth daily. 12/13/18   Ronnell Freshwater, NP  Dulaglutide (TRULICITY) 1.5 0000000 SOPN Inject 1.5 mg into the skin once a week. 04/09/19   Ronnell Freshwater, NP  finasteride (PROSCAR) 5 MG tablet Take 1 tablet (5 mg total) by mouth daily. 08/23/18   Ronnell Freshwater, NP  glimepiride (AMARYL) 4 MG tablet Take 1 tablet (4 mg total) by mouth 2 (two) times daily. 08/23/18   Ronnell Freshwater, NP  glucose blood (BAYER CONTOUR NEXT TEST) test strip Use as instructed to check blood sugar 3 times per day at various different times. dx code E11.65 Bring meter to every visit. 05/17/15   Elayne Snare, MD  Lancets 30G MISC Use to check blood sugars 5 times per day 03/27/13   [provider]  metFORMIN (GLUCOPHAGE) 500 MG tablet Take 1 tablet (500 mg total) by mouth daily. 12/02/18  Ronnell Freshwater, NP  metFORMIN (GLUCOPHAGE-XR) 750 MG 24 hr tablet Take 2 tablets (1,500 mg total) by mouth at bedtime. 12/16/18   Ronnell Freshwater, NP  rosuvastatin (CRESTOR) 40 MG tablet Take 1 tablet (40 mg total) by mouth daily. 01/06/19   Ronnell Freshwater, NP  tamsulosin (FLOMAX) 0.4 MG CAPS capsule Take 1 capsule (0.4 mg total) by mouth daily. 02/17/19   Ronnell Freshwater, NP    Allergies Patient has no known allergies.  Family History  Problem Relation Age of Onset  . Heart disease Father   . Prostate cancer Neg Hx   . Kidney cancer Neg Hx   . Bladder Cancer Neg Hx     Social History Social History   Tobacco Use  . Smoking status: Never  Smoker  . Smokeless tobacco: Never Used  Substance Use Topics  . Alcohol use: Yes    Comment: rare  . Drug use: No    Review of Systems Constitutional: No fever/chills Eyes: No visual changes. ENT: No sore throat. Cardiovascular: See HPI Respiratory: Denies shortness of breath. Gastrointestinal: No abdominal pain.   Genitourinary: Negative for dysuria. Musculoskeletal: Negative for back pain. Skin: Negative for rash. Neurological: Negative for headaches, areas of focal weakness or numbness.    ____________________________________________   PHYSICAL EXAM:  VITAL SIGNS: ED Triage Vitals  Enc Vitals Group     BP 04/10/19 1303 137/88     Pulse Rate 04/10/19 1303 96     Resp 04/10/19 1303 16     Temp 04/10/19 1303 98.6 F (37 C)     Temp Source 04/10/19 1303 Oral     SpO2 04/10/19 1303 98 %     Weight --      Height --      Head Circumference --      Peak Flow --      Pain Score 04/10/19 1304 6     Pain Loc --      Pain Edu? --      Excl. in [place]? --     Constitutional: Alert and oriented. Well appearing and in no acute distress. Eyes: Conjunctivae are normal. Head: Atraumatic. Nose: No congestion/rhinnorhea. Mouth/Throat: Mucous membranes are moist. Neck: No stridor.  Cardiovascular: Normal rate, regular rhythm. Grossly normal heart sounds.  Good peripheral circulation.  Does report some tenderness when I press across the right anterior rib cage especially along the lower costal margin. Respiratory: Normal respiratory effort.  No retractions. Lungs CTAB. Gastrointestinal: Soft and nontender. No distention.  Negative Murphy.  Denies pain or discomfort to palpation right upper quadrant at this time. Musculoskeletal: No lower extremity tenderness nor edema. Neurologic:  Normal speech and language. No gross focal neurologic deficits are appreciated.  Skin:  Skin is warm, dry and intact. No rash noted. Psychiatric: Mood and affect are normal. Speech and behavior are  normal.  ____________________________________________   LABS (all labs ordered are listed, but only abnormal results are displayed)  Labs Reviewed  BASIC METABOLIC PANEL - Abnormal; Notable for the following components:      Result Value   CO2 18 (*)    Glucose, Bld 106 (*)    All other components within normal limits  CBC  HEPATIC FUNCTION PANEL  LIPASE, BLOOD  FIBRIN DERIVATIVES D-DIMER ([facility] ONLY)  TROPONIN I (HIGH SENSITIVITY)  TROPONIN I (HIGH SENSITIVITY)   ____________________________________________  EKG  EKG reviewed at 1310 Heart rate 95 QRS 120 QTc 470 Normal sinus rhythm, right bundle branch block-like morphology  noted, however occasional possible fusion beat.  There is no evidence of acute ischemia denoted ____________________________________________  RADIOLOGY  Dg Chest 2 View  Result Date: 04/10/2019 CLINICAL DATA:  Chest pain. Additional history provided: Right-sided, constant, sharp chest pain for 4-5 days. EXAM: CHEST - 2 VIEW COMPARISON:  Report from prior chest radiograph 06/23/1996 (images unavailable). FINDINGS: Heart size within normal limits.  Aortic atherosclerosis. There is no airspace consolidation within the lungs. No evidence of pleural effusion or pneumothorax. No acute bony abnormality. Thoracic spondylosis with anterolateral osteophytes. IMPRESSION: No evidence of acute cardiopulmonary abnormality. Aortic atherosclerosis. Electronically Signed   By: Kellie Simmering DO   On: 04/10/2019 13:46   US Abdomen Limited Ruq  Result Date: 04/10/2019 CLINICAL DATA:  Epigastric abdominal pain for 4-5 days. EXAM: ULTRASOUND ABDOMEN LIMITED RIGHT UPPER QUADRANT COMPARISON:  CT, 04/28/2009. FINDINGS: Gallbladder: Gallbladder moderately distended. No stones, wall thickening or pericholecystic fluid. Common bile duct: Diameter: 4 mm Liver: Normal overall parenchymal echogenicity. Normal size. Left lobe hyperechoic lesion measuring 1.3 x 1.0 x 1.3 cm consistent with  a hemangioma. This was present on the prior CT. Right lobe, 7 mm, hyperechoic lesion, also likely a hemangioma, not evident on the prior CT. Hypoechoic area along the posterior margin of the left lobe, 1.2 x 0.7 x 0.6 cm, which may reflect a cyst possibly focal fatty infiltration not evident on the prior CT. No other liver masses or lesions. Portal vein is patent on color Doppler imaging with normal direction of blood flow towards the liver. Other: None. IMPRESSION: 1. No acute findings.  Normal gallbladder.  No bile duct dilation. 2. Liver lesions as described to consistent with hemangiomas, the larger present on the prior CT. There is a questionable cyst versus focal fatty infiltration along the posterior aspect the left lobe, 1.2 cm in long axis. No other abnormalities. Electronically Signed   By: Lajean Manes M.D.   On: 04/10/2019 16:35    Ultrasound imaging reviewed negative for acute findings. ____________________________________________   PROCEDURES  Procedure(s) performed: None  Procedures  Critical Care performed: No  ____________________________________________   INITIAL IMPRESSION / ASSESSMENT AND PLAN / ED COURSE  Pertinent labs & imaging results that were available during my care of the patient were reviewed by me and considered in my medical decision making (see chart for details).   Patient comes for evaluation of right-sided chest pain with right upper quadrant pain.  Very reassuring abdominal exam.  No symptoms of typical chest pain.  No left-sided no radiating.  Reassuring examination, EKG without ischemia 2 - troponins.  Patient reports he feels well after ibuprofen.  He is comfortable with plan to follow-up with Dr. Yancey Flemings.  Right upper quadrant ultrasound negative for acute findings.  He reports feeling improved.  He is low risk by hear score for cardiac cause  D-dimer negative, low risk by Wells criteria.  Chest x-ray without evidence of acute infiltrate.  No infectious  symptoms.  Suspect likely musculoskeletal in nature given location of pain reassuring work-up.  Return precautions and treatment recommendations and follow-up discussed with the patient who is agreeable with the plan.      ____________________________________________   FINAL CLINICAL IMPRESSION(S) / ED DIAGNOSES  Final diagnoses:  Abdominal pain  Right-sided chest pain        Note:  This document was prepared using Dragon voice recognition software and may include unintentional dictation errors       Delman Kitten, MD 04/10/19 1740

## 2019-04-10 NOTE — ED Triage Notes (Signed)
PT sent from Dr. Humphrey Rolls c/o CP [phone]. Denies any SOB. VS

## 2019-04-10 NOTE — ED Triage Notes (Signed)
First Nurse Note:  Arrives from Dr. Laurelyn Sickle office for evaluation of epigastric pain.  Per Dr. Humphrey Rolls, she sent patient to ED to evaluate for possible gallbladder disease vs pancreatitis.  Patient is AAOx3.  Skin warm and dry. NAD

## 2019-04-14 ENCOUNTER — Other Ambulatory Visit: Payer: Self-pay

## 2019-04-14 MED ORDER — TAMSULOSIN HCL 0.4 MG PO CAPS: 0.4000 mg | ORAL_CAPSULE | Freq: Every day | ORAL | 0 refills | Status: DC

## 2019-04-29 ENCOUNTER — Telehealth: Payer: Self-pay

## 2019-04-29 NOTE — Telephone Encounter (Signed)
Confirmed appointment with patient. klh °

## 2019-05-01 ENCOUNTER — Encounter: Payer: Self-pay | Admitting: Adult Health

## 2019-05-01 ENCOUNTER — Ambulatory Visit: Payer: BC Managed Care – PPO | Admitting: Adult Health

## 2019-05-01 ENCOUNTER — Other Ambulatory Visit: Payer: Self-pay

## 2019-05-01 VITALS — BP 138/87 | HR 85 | Temp 97.3°F | Resp 18 | Ht 75.0 in | Wt 195.0 lb

## 2019-05-01 DIAGNOSIS — E1165 Type 2 diabetes mellitus with hyperglycemia: Secondary | ICD-10-CM

## 2019-05-01 DIAGNOSIS — N400 Enlarged prostate without lower urinary tract symptoms: Secondary | ICD-10-CM

## 2019-05-01 DIAGNOSIS — D1803 Hemangioma of intra-abdominal structures: Secondary | ICD-10-CM

## 2019-05-01 DIAGNOSIS — R0789 Other chest pain: Secondary | ICD-10-CM

## 2019-05-01 DIAGNOSIS — I10 Essential (primary) hypertension: Secondary | ICD-10-CM

## 2019-05-01 MED ORDER — CYCLOBENZAPRINE HCL 5 MG PO TABS: 5.0000 mg | ORAL_TABLET | Freq: Every day | ORAL | 0 refills | Status: DC

## 2019-05-01 NOTE — Progress Notes (Signed)
[facility] [place], [place] [address]  Internal MEDICINE  Office Visit Note  Patient Name: Michael Gross  [id number]  PT:[id number]  Date of Service: 05/01/2019  Chief Complaint  Patient presents with  . Chest Pain    HPI  Pt is here for follow up.  He continues to report some right chest pain that is worse with movement. He was sent to the ED at his last appointment for concern of PE.  He was evaluated by ED and and determined to be low risk.  His D-dimer was negative.  They believe it is musculoskeletal. Incidentally, he was found to have two hemangiomas and a cyst on his liver.       Current Medication: Outpatient Encounter Medications as of 05/01/2019  Medication Sig  . BAYER MICROLET LANCETS lancets Use as instructed to check blood sugar 2 times per day dx code E11.65  . canagliflozin (INVOKANA) 300 MG TABS tablet Take 1 tablet (300 mg total) by mouth daily.  . cyclobenzaprine (FLEXERIL) 5 MG tablet Take 1 tablet (5 mg total) by mouth at bedtime.  . Dulaglutide (TRULICITY) 1.5 0000000 SOPN Inject 1.5 mg into the skin once a week.  . finasteride (PROSCAR) 5 MG tablet Take 1 tablet (5 mg total) by mouth daily.  Marland Kitchen glimepiride (AMARYL) 4 MG tablet Take 1 tablet (4 mg total) by mouth 2 (two) times daily.  Marland Kitchen glucose blood (BAYER CONTOUR NEXT TEST) test strip Use as instructed to check blood sugar 3 times per day at various different times. dx code E11.65 Bring meter to every visit.  . Lancets 30G MISC Use to check blood sugars 5 times per day  . metFORMIN (GLUCOPHAGE) 500 MG tablet Take 1 tablet (500 mg total) by mouth daily.  . metFORMIN (GLUCOPHAGE-XR) 750 MG 24 hr tablet Take 2 tablets (1,500 mg total) by mouth at bedtime.  . rosuvastatin (CRESTOR) 40 MG tablet Take 1 tablet (40 mg total) by mouth daily.  . tamsulosin (FLOMAX) 0.4 MG CAPS capsule Take 1 capsule (0.4 mg total) by mouth daily.   No facility-administered encounter medications on file  as of 05/01/2019.    Surgical History: Past Surgical History:  Procedure Laterality Date  . HERNIA REPAIR     2003  . KNEE SURGERY     1998  . MOHS SURGERY     2003    Medical History: Past Medical History:  Diagnosis Date  . Basal cell carcinoma   . BPH (benign prostatic hyperplasia)   . Diabetes ([place])   . Hyperlipidemia     Family History: Family History  Problem Relation Age of Onset  . Heart disease Father   . Prostate cancer Neg Hx   . Kidney cancer Neg Hx   . Bladder Cancer Neg Hx     Social History   Socioeconomic History  . Marital status: Married    Spouse name: Not on file  . Number of children: Not on file  . Years of education: Not on file  . Highest education level: Not on file  Occupational History  . Not on file  Tobacco Use  . Smoking status: Never Smoker  . Smokeless tobacco: Never Used  Substance and Sexual Activity  . Alcohol use: Yes    Comment: rare  . Drug use: No  . Sexual activity: Not on file  Other Topics Concern  . Not on file  Social History Narrative  . Not on file   Social Determinants of  Health   Financial Resource Strain:   . Difficulty of Paying Living Expenses: Not on file  Food Insecurity:   . Worried About Charity fundraiser in the Last Year: Not on file  . Ran Out of Food in the Last Year: Not on file  Transportation Needs:   . Lack of Transportation (Medical): Not on file  . Lack of Transportation (Non-Medical): Not on file  Physical Activity:   . Days of Exercise per Week: Not on file  . Minutes of Exercise per Session: Not on file  Stress:   . Feeling of Stress : Not on file  Social Connections:   . Frequency of Communication with Friends and Family: Not on file  . Frequency of Social Gatherings with Friends and Family: Not on file  . Attends Religious Services: Not on file  . Active Member of Clubs or Organizations: Not on file  . Attends Archivist Meetings: Not on file  . Marital Status:  Not on file  Intimate Partner Violence:   . Fear of Current or Ex-Partner: Not on file  . Emotionally Abused: Not on file  . Physically Abused: Not on file  . Sexually Abused: Not on file      Review of Systems  Constitutional: Negative.  Negative for chills, fatigue and unexpected weight change.  HENT: Negative.  Negative for congestion, rhinorrhea, sneezing and sore throat.   Eyes: Negative for redness.  Respiratory: Negative.  Negative for cough, chest tightness and shortness of breath.   Cardiovascular: Negative.  Negative for chest pain and palpitations.  Gastrointestinal: Negative.  Negative for abdominal pain, constipation, diarrhea, nausea and vomiting.  Endocrine: Negative.   Genitourinary: Negative.  Negative for dysuria and frequency.  Musculoskeletal: Negative.  Negative for arthralgias, back pain, joint swelling and neck pain.  Skin: Negative.  Negative for rash.  Allergic/Immunologic: Negative.   Neurological: Negative.  Negative for tremors and numbness.  Hematological: Negative for adenopathy. Does not bruise/bleed easily.  Psychiatric/Behavioral: Negative.  Negative for behavioral problems, sleep disturbance and suicidal ideas. The patient is not nervous/anxious.     Vital Signs: BP 138/87   Pulse 85   Temp (!) 97.3 F (36.3 C)   Resp 18   Ht 6\' 3"  (1.905 m)   Wt 195 lb (88.5 kg)   SpO2 96%   BMI 24.37 kg/m    Physical Exam Vitals and nursing note reviewed.  Constitutional:      General: He is not in acute distress.    Appearance: He is well-developed. He is not diaphoretic.  HENT:     Head: Normocephalic and atraumatic.     Mouth/Throat:     Pharynx: No oropharyngeal exudate.  Eyes:     Pupils: Pupils are equal, round, and reactive to light.  Neck:     Thyroid: No thyromegaly.     Vascular: No JVD.     Trachea: No tracheal deviation.  Cardiovascular:     Rate and Rhythm: Normal rate and regular rhythm.     Heart sounds: Normal heart sounds.  No murmur. No friction rub. No gallop.   Pulmonary:     Effort: Pulmonary effort is normal. No respiratory distress.     Breath sounds: Normal breath sounds. No wheezing or rales.  Chest:     Chest wall: No tenderness.  Abdominal:     Palpations: Abdomen is soft.     Tenderness: There is no abdominal tenderness. There is no guarding.  Musculoskeletal:  General: Normal range of motion.     Cervical back: Normal range of motion and neck supple.  Lymphadenopathy:     Cervical: No cervical adenopathy.  Skin:    General: Skin is warm and dry.  Neurological:     Mental Status: He is alert and oriented to person, place, and time.     Cranial Nerves: No cranial nerve deficit.  Psychiatric:        Behavior: Behavior normal.        Thought Content: Thought content normal.        Judgment: Judgment normal.    Assessment/Plan: 1. Other chest pain Continue with heat, and rest of muscle.  May try flexeril at bedtime to help with relaxing muscle.  - cyclobenzaprine (FLEXERIL) 5 MG tablet; Take 1 tablet (5 mg total) by mouth at bedtime.  Dispense: 10 tablet; Refill: 0  2. Hemangioma of liver Continue to monitor, control DM.  Up to date on colonoscopy.   3. Uncontrolled type 2 diabetes mellitus with hyperglycemia (HCC) Last A1C 8.9, pt is now on basaglar 18 units daily.  Will follow up as scheduled for next A1C  4. Essential hypertension Controlled, continue present management.   5. Benign prostatic hyperplasia without lower urinary tract symptoms Continue flomax as ordered.   General Counseling: marik darland understanding of the findings of todays visit and agrees with plan of treatment. I have discussed any further diagnostic evaluation that may be needed or ordered today. We also reviewed his medications today. he has been encouraged to call the office with any questions or concerns that should arise related to todays visit.    No orders of the defined types were placed  in this encounter.   Meds ordered this encounter  Medications  . cyclobenzaprine (FLEXERIL) 5 MG tablet    Sig: Take 1 tablet (5 mg total) by mouth at bedtime.    Dispense:  10 tablet    Refill:  0    Time spent: 25 Minutes   This patient was seen by Orson Gear AGNP-C in Collaboration with Dr Lavera Guise as a part of collaborative care agreement     Kendell Bane AGNP-C Internal medicine

## 2019-05-08 ENCOUNTER — Other Ambulatory Visit: Payer: Self-pay

## 2019-05-08 MED ORDER — BASAGLAR KWIKPEN 100 UNIT/ML ~~LOC~~ SOPN: PEN_INJECTOR | SUBCUTANEOUS | 0 refills | Status: DC

## 2019-05-19 ENCOUNTER — Emergency Department: Payer: BC Managed Care – PPO

## 2019-05-19 ENCOUNTER — Other Ambulatory Visit: Payer: Self-pay

## 2019-05-19 ENCOUNTER — Emergency Department
Admission: EM | Admit: 2019-05-19 | Discharge: 2019-05-20 | Disposition: A | Discharge status: Home / Self Care | Payer: BC Managed Care – PPO | Attending: Emergency Medicine | Admitting: Emergency Medicine

## 2019-05-19 DIAGNOSIS — Z5321 Procedure and treatment not carried out due to patient leaving prior to being seen by health care provider: Secondary | ICD-10-CM | POA: Diagnosis not present

## 2019-05-19 DIAGNOSIS — R079 Chest pain, unspecified: Secondary | ICD-10-CM | POA: Diagnosis not present

## 2019-05-19 DIAGNOSIS — R0789 Other chest pain: Secondary | ICD-10-CM | POA: Diagnosis not present

## 2019-05-19 LAB — TROPONIN I (HIGH SENSITIVITY)
Troponin I (High Sensitivity): 8 ng/L (ref <18)
Troponin I (High Sensitivity): 9 ng/L (ref <18)

## 2019-05-19 LAB — CBC
HCT: 55.1 % — ABNORMAL HIGH (ref 39.0–52.0)
Hemoglobin: 17.7 g/dL — ABNORMAL HIGH (ref 13.0–17.0)
MCH: 29 pg (ref 26.0–34.0)
MCHC: 32.1 g/dL (ref 30.0–36.0)
MCV: 90.3 fL (ref 80.0–100.0)
Platelets: 208 10*3/uL (ref 150–400)
RBC: 6.1 MIL/uL — ABNORMAL HIGH (ref 4.22–5.81)
RDW: 12.9 % (ref 11.5–15.5)
WBC: 9 10*3/uL (ref 4.0–10.5)
nRBC: 0 % (ref 0.0–0.2)

## 2019-05-19 LAB — BASIC METABOLIC PANEL
Anion gap: 19 — ABNORMAL HIGH (ref 5–15)
BUN: 22 mg/dL (ref 8–23)
CO2: 14 mmol/L — ABNORMAL LOW (ref 22–32)
Calcium: 9.2 mg/dL (ref 8.9–10.3)
Chloride: 102 mmol/L (ref 98–111)
Creatinine, Ser: 0.99 mg/dL (ref 0.61–1.24)
GFR calc Af Amer: >60 mL/min (ref >60)
GFR calc non Af Amer: >60 mL/min (ref >60)
Glucose, Bld: 256 mg/dL — ABNORMAL HIGH (ref 70–99)
Potassium: 5 mmol/L (ref 3.5–5.1)
Sodium: 135 mmol/L (ref 135–145)

## 2019-05-19 NOTE — ED Triage Notes (Addendum)
Reports centralized CP intermittent over last few days, reports tachycardia on exertion. Pt alert and oriented X4, cooperative, RR even and unlabored, color WNL. Pt in NAD.   Sob at night.

## 2019-05-20 NOTE — ED Notes (Signed)
Pt upset over wait time, st unable to wait any longer and now leaving

## 2019-05-21 ENCOUNTER — Telehealth: Payer: Self-pay

## 2019-05-21 NOTE — Telephone Encounter (Signed)
Confirmed appointment with patient. klh °

## 2019-05-22 ENCOUNTER — Encounter: Payer: Self-pay | Admitting: Adult Health

## 2019-05-22 ENCOUNTER — Ambulatory Visit: Payer: BC Managed Care – PPO | Admitting: Adult Health

## 2019-05-22 VITALS — Ht 75.0 in | Wt 190.0 lb

## 2019-05-22 DIAGNOSIS — N400 Enlarged prostate without lower urinary tract symptoms: Secondary | ICD-10-CM

## 2019-05-22 DIAGNOSIS — I10 Essential (primary) hypertension: Secondary | ICD-10-CM

## 2019-05-22 DIAGNOSIS — R079 Chest pain, unspecified: Secondary | ICD-10-CM

## 2019-05-22 MED ORDER — TAMSULOSIN HCL 0.4 MG PO CAPS: 0.8000 mg | ORAL_CAPSULE | Freq: Every day | ORAL | 1 refills | Status: DC

## 2019-05-22 NOTE — Progress Notes (Signed)
[facility] [place], [place] [address]  Internal MEDICINE  Telephone Visit  Patient Name: Michael Gross  [id number]  FM:[id number]  Date of Service: 05/22/2019  I connected with the patient at 305 by telephone and verified the patients identity using two identifiers.   I discussed the limitations, risks, security and privacy concerns of performing an evaluation and management service by telephone and the availability of in person appointments. I also discussed with the patient that there may be a patient responsible charge related to the service.  The patient expressed understanding and agrees to proceed.    Chief Complaint  Patient presents with  . Telephone Screen    pt went to ER and they did EKG   . Telephone Assessment  . Chest Pain    right side and going on last 2 months   . Nocturia  . Shortness of Breath    HPI  Pt is seen via telephone.  He reports some right sided chest pain that had been intermittent for a few days.  He went to the ER for evaluation.  PE and MI were ruled out.  They believe it to be musculoskeletal.  He is concerned because the pain has been present for two months intermittently.  He does not feel like it is improving at all.    Current Medication: Outpatient Encounter Medications as of 05/22/2019  Medication Sig  . BAYER MICROLET LANCETS lancets Use as instructed to check blood sugar 2 times per day dx code E11.65  . canagliflozin (INVOKANA) 300 MG TABS tablet Take 1 tablet (300 mg total) by mouth daily.  . cyclobenzaprine (FLEXERIL) 5 MG tablet Take 1 tablet (5 mg total) by mouth at bedtime.  . Dulaglutide (TRULICITY) 1.5 0000000 SOPN Inject 1.5 mg into the skin once a week.  . finasteride (PROSCAR) 5 MG tablet Take 1 tablet (5 mg total) by mouth daily.  Marland Kitchen glimepiride (AMARYL) 4 MG tablet Take 1 tablet (4 mg total) by mouth 2 (two) times daily.  Marland Kitchen glucose blood (BAYER CONTOUR NEXT TEST) test strip Use as instructed to  check blood sugar 3 times per day at various different times. dx code E11.65 Bring meter to every visit.  . Insulin Glargine (BASAGLAR KWIKPEN) 100 UNIT/ML SOPN Take 18 units daily.  . Lancets 30G MISC Use to check blood sugars 5 times per day  . metFORMIN (GLUCOPHAGE) 500 MG tablet Take 1 tablet (500 mg total) by mouth daily.  . metFORMIN (GLUCOPHAGE-XR) 750 MG 24 hr tablet Take 2 tablets (1,500 mg total) by mouth at bedtime.  . rosuvastatin (CRESTOR) 40 MG tablet Take 1 tablet (40 mg total) by mouth daily.  . tamsulosin (FLOMAX) 0.4 MG CAPS capsule Take 2 capsules (0.8 mg total) by mouth daily.  . [DISCONTINUED] tamsulosin (FLOMAX) 0.4 MG CAPS capsule Take 1 capsule (0.4 mg total) by mouth daily.   No facility-administered encounter medications on file as of 05/22/2019.    Surgical History: Past Surgical History:  Procedure Laterality Date  . HERNIA REPAIR     2003  . KNEE SURGERY     1998  . MOHS SURGERY     2003    Medical History: Past Medical History:  Diagnosis Date  . Basal cell carcinoma   . BPH (benign prostatic hyperplasia)   . Diabetes ([place])   . Hyperlipidemia     Family History: Family History  Problem Relation Age of Onset  . Heart disease Father   . Prostate  cancer Neg Hx   . Kidney cancer Neg Hx   . Bladder Cancer Neg Hx     Social History   Socioeconomic History  . Marital status: Married    Spouse name: Not on file  . Number of children: Not on file  . Years of education: Not on file  . Highest education level: Not on file  Occupational History  . Not on file  Tobacco Use  . Smoking status: Never Smoker  . Smokeless tobacco: Never Used  Substance and Sexual Activity  . Alcohol use: Yes    Comment: rare  . Drug use: No  . Sexual activity: Not on file  Other Topics Concern  . Not on file  Social History Narrative  . Not on file   Social Determinants of Health   Financial Resource Strain:   . Difficulty of Paying Living Expenses: Not on  file  Food Insecurity:   . Worried About Charity fundraiser in the Last Year: Not on file  . Ran Out of Food in the Last Year: Not on file  Transportation Needs:   . Lack of Transportation (Medical): Not on file  . Lack of Transportation (Non-Medical): Not on file  Physical Activity:   . Days of Exercise per Week: Not on file  . Minutes of Exercise per Session: Not on file  Stress:   . Feeling of Stress : Not on file  Social Connections:   . Frequency of Communication with Friends and Family: Not on file  . Frequency of Social Gatherings with Friends and Family: Not on file  . Attends Religious Services: Not on file  . Active Member of Clubs or Organizations: Not on file  . Attends Archivist Meetings: Not on file  . Marital Status: Not on file  Intimate Partner Violence:   . Fear of Current or Ex-Partner: Not on file  . Emotionally Abused: Not on file  . Physically Abused: Not on file  . Sexually Abused: Not on file      Review of Systems  Constitutional: Negative.  Negative for chills, fatigue and unexpected weight change.  HENT: Negative.  Negative for congestion, rhinorrhea, sneezing and sore throat.   Eyes: Negative for redness.  Respiratory: Negative.  Negative for cough, chest tightness and shortness of breath.   Cardiovascular: Negative.  Negative for chest pain and palpitations.  Gastrointestinal: Negative.  Negative for abdominal pain, constipation, diarrhea, nausea and vomiting.  Endocrine: Negative.   Genitourinary: Negative.  Negative for dysuria and frequency.  Musculoskeletal: Negative.  Negative for arthralgias, back pain, joint swelling and neck pain.       Chest pain  Skin: Negative.  Negative for rash.  Allergic/Immunologic: Negative.   Neurological: Negative.  Negative for tremors and numbness.  Hematological: Negative for adenopathy. Does not bruise/bleed easily.  Psychiatric/Behavioral: Negative.  Negative for behavioral problems, sleep  disturbance and suicidal ideas. The patient is not nervous/anxious.     Vital Signs: Ht 6\' 3"  (1.905 m)   Wt 190 lb (86.2 kg)   BMI 23.75 kg/m    Observation/Objective:  Well sounding, NAD noted.    Assessment/Plan: 1. Chest pain, unspecified type Suspicious for costochondritis Would benefit from imaging of chest, to rule out other pathology. - MR CHEST W WO CONTRAST; Future  2. Benign prostatic hyperplasia without lower urinary tract symptoms Increase dose of Flomax to 0.8mg  daily. - tamsulosin (FLOMAX) 0.4 MG CAPS capsule; Take 2 capsules (0.8 mg total) by mouth daily.  Dispense: 180 capsule; Refill: 1  3. Essential hypertension Stable, continue present management.  General Counseling: doruk chadwick understanding of the findings of today's phone visit and agrees with plan of treatment. I have discussed any further diagnostic evaluation that may be needed or ordered today. We also reviewed his medications today. he has been encouraged to call the office with any questions or concerns that should arise related to todays visit.    Orders Placed This Encounter  Procedures  . MR CHEST W WO CONTRAST    Meds ordered this encounter  Medications  . tamsulosin (FLOMAX) 0.4 MG CAPS capsule    Sig: Take 2 capsules (0.8 mg total) by mouth daily.    Dispense:  180 capsule    Refill:  1    Time spent: [place] AGNP-C Internal medicine

## 2019-05-23 ENCOUNTER — Other Ambulatory Visit: Payer: Self-pay

## 2019-05-23 MED ORDER — BD PEN NEEDLE NANO 2ND GEN 32G X 4 MM MISC: 3 refills | Status: DC

## 2019-05-23 MED ORDER — BD PEN NEEDLE NANO U/F 32G X 4 MM MISC: 3 refills | Status: DC

## 2019-06-13 ENCOUNTER — Telehealth: Payer: Self-pay

## 2019-06-13 ENCOUNTER — Other Ambulatory Visit: Payer: Self-pay | Admitting: Adult Health

## 2019-06-13 MED ORDER — DIAZEPAM 5 MG PO TABS: ORAL_TABLET | ORAL | 0 refills | Status: DC

## 2019-06-13 NOTE — Progress Notes (Signed)
Sent RX for valium for mri.

## 2019-06-13 NOTE — Telephone Encounter (Signed)
Pt advised  As per adam for glucose need to take more reading for few days and also he can go up on baslagar 26 units if need to and also we send valium for mri he need driver if he taking valium

## 2019-06-17 ENCOUNTER — Other Ambulatory Visit: Payer: Self-pay | Admitting: Adult Health

## 2019-06-18 ENCOUNTER — Telehealth: Payer: Self-pay

## 2019-06-19 ENCOUNTER — Other Ambulatory Visit: Payer: Self-pay | Admitting: Adult Health

## 2019-06-19 DIAGNOSIS — R0782 Intercostal pain: Secondary | ICD-10-CM

## 2019-06-19 NOTE — Progress Notes (Signed)
Reordered MRI per radiology recommendations.

## 2019-06-20 NOTE — Telephone Encounter (Signed)
As per adam take basaglar increase 2 units a day upto 30 units and stay on 30 units and if morning reading is less than 150 is good and keep follow up and we can check A1C

## 2019-07-28 ENCOUNTER — Other Ambulatory Visit: Payer: Self-pay | Admitting: Adult Health

## 2019-07-29 ENCOUNTER — Telehealth: Payer: Self-pay

## 2019-07-29 ENCOUNTER — Other Ambulatory Visit: Payer: Self-pay

## 2019-07-29 MED ORDER — CANAGLIFLOZIN 300 MG PO TABS: 300.0000 mg | ORAL_TABLET | Freq: Every day | ORAL | 5 refills | Status: DC

## 2019-07-29 NOTE — Telephone Encounter (Signed)
Called lmom informing patient of appointment on 07/31/2019. klh

## 2019-07-29 NOTE — Telephone Encounter (Signed)
Confirmed appointment on 07/31/2019 and screened for covid. klh  °

## 2019-07-30 ENCOUNTER — Ambulatory Visit: Payer: BC Managed Care – PPO | Admitting: Adult Health

## 2019-07-31 ENCOUNTER — Encounter: Payer: Self-pay | Admitting: Adult Health

## 2019-07-31 ENCOUNTER — Ambulatory Visit: Payer: BC Managed Care – PPO | Admitting: Adult Health

## 2019-07-31 ENCOUNTER — Other Ambulatory Visit: Payer: Self-pay

## 2019-07-31 VITALS — BP 125/73 | HR 81 | Temp 97.8°F | Resp 16 | Ht 75.0 in | Wt 204.0 lb

## 2019-07-31 DIAGNOSIS — E1165 Type 2 diabetes mellitus with hyperglycemia: Secondary | ICD-10-CM | POA: Diagnosis not present

## 2019-07-31 DIAGNOSIS — M25471 Effusion, right ankle: Secondary | ICD-10-CM | POA: Diagnosis not present

## 2019-07-31 DIAGNOSIS — I10 Essential (primary) hypertension: Secondary | ICD-10-CM | POA: Diagnosis not present

## 2019-07-31 DIAGNOSIS — E7800 Pure hypercholesterolemia, unspecified: Secondary | ICD-10-CM

## 2019-07-31 DIAGNOSIS — D1803 Hemangioma of intra-abdominal structures: Secondary | ICD-10-CM

## 2019-07-31 DIAGNOSIS — M25472 Effusion, left ankle: Secondary | ICD-10-CM

## 2019-07-31 LAB — POCT GLYCOSYLATED HEMOGLOBIN (HGB A1C): Hemoglobin A1C: 10.3 % — AB (ref 4.0–5.6)

## 2019-07-31 MED ORDER — GLIMEPIRIDE 4 MG PO TABS: 4.0000 mg | ORAL_TABLET | Freq: Two times a day (BID) | ORAL | 1 refills | Status: DC

## 2019-07-31 MED ORDER — BASAGLAR KWIKPEN 100 UNIT/ML ~~LOC~~ SOPN: PEN_INJECTOR | SUBCUTANEOUS | 0 refills | Status: DC

## 2019-07-31 NOTE — Progress Notes (Signed)
[facility] [place], [place] [address]  Internal MEDICINE  Office Visit Note  Patient Name: Michael Gross  [id number]  FM:[id number]  Date of Service: 07/31/2019  Chief Complaint  Patient presents with  . Follow-up    HPI Patient is here today for follow-up on his chronic medical conditions. His A1C today is 10.3, up from 3 months prior. He is currently taking Invokana and glimepiride as well as long acting insulin. He has been increasing his insulin glargine based on his elevated blood glucose readings at home. He is currently taking 30 units of insulin glargine daily at this time.  His blood sugar log from home averages around 120-130, only a few 200 or greater documented readings.   Current Medication: Outpatient Encounter Medications as of 07/31/2019  Medication Sig  . BAYER MICROLET LANCETS lancets Use as instructed to check blood sugar 2 times per day dx code E11.65  . canagliflozin (INVOKANA) 300 MG TABS tablet Take 1 tablet (300 mg total) by mouth daily.  . cyclobenzaprine (FLEXERIL) 5 MG tablet Take 1 tablet (5 mg total) by mouth at bedtime.  . diazepam (VALIUM) 5 MG tablet Take one tablet one hour before MRI, may repeat dose 15 minutes before MRI if needed.  Must have driver for pre and post procedure.  . finasteride (PROSCAR) 5 MG tablet Take 1 tablet (5 mg total) by mouth daily.  Marland Kitchen glimepiride (AMARYL) 4 MG tablet Take 1 tablet (4 mg total) by mouth 2 (two) times daily.  Marland Kitchen glucose blood (BAYER CONTOUR NEXT TEST) test strip Use as instructed to check blood sugar 3 times per day at various different times. dx code E11.65 Bring meter to every visit.  . Insulin Glargine (BASAGLAR KWIKPEN) 100 UNIT/ML ADMINISTER 18 UNITS UNDER THE SKIN DAILY  . Insulin Pen Needle (BD PEN NEEDLE NANO 2ND GEN) 32G X 4 MM MISC Use as directed once a daily ELL.65  . Lancets 30G MISC Use to check blood sugars 5 times per day  . tamsulosin (FLOMAX) 0.4 MG CAPS capsule Take  2 capsules (0.8 mg total) by mouth daily.   No facility-administered encounter medications on file as of 07/31/2019.    Surgical History: Past Surgical History:  Procedure Laterality Date  . HERNIA REPAIR     2003  . KNEE SURGERY     1998  . MOHS SURGERY     2003    Medical History: Past Medical History:  Diagnosis Date  . Basal cell carcinoma   . BPH (benign prostatic hyperplasia)   . Diabetes ([place])   . Hyperlipidemia     Family History: Family History  Problem Relation Age of Onset  . Heart disease Father   . Prostate cancer Neg Hx   . Kidney cancer Neg Hx   . Bladder Cancer Neg Hx     Social History   Socioeconomic History  . Marital status: Married    Spouse name: Not on file  . Number of children: Not on file  . Years of education: Not on file  . Highest education level: Not on file  Occupational History  . Not on file  Tobacco Use  . Smoking status: Never Smoker  . Smokeless tobacco: Never Used  Substance and Sexual Activity  . Alcohol use: Yes    Comment: rare  . Drug use: No  . Sexual activity: Not on file  Other Topics Concern  . Not on file  Social History Narrative  . Not on  file   Social Determinants of Health   Financial Resource Strain:   . Difficulty of Paying Living Expenses:   Food Insecurity:   . Worried About Charity fundraiser in the Last Year:   . Arboriculturist in the Last Year:   Transportation Needs:   . Film/video editor (Medical):   Marland Kitchen Lack of Transportation (Non-Medical):   Physical Activity:   . Days of Exercise per Week:   . Minutes of Exercise per Session:   Stress:   . Feeling of Stress :   Social Connections:   . Frequency of Communication with Friends and Family:   . Frequency of Social Gatherings with Friends and Family:   . Attends Religious Services:   . Active Member of Clubs or Organizations:   . Attends Archivist Meetings:   Marland Kitchen Marital Status:   Intimate Partner Violence:   . Fear of  Current or Ex-Partner:   . Emotionally Abused:   Marland Kitchen Physically Abused:   . Sexually Abused:       Review of Systems  Constitutional: Negative.  Negative for chills, fatigue and unexpected weight change.  HENT: Negative.  Negative for congestion, rhinorrhea, sneezing and sore throat.   Eyes: Negative for redness.  Respiratory: Negative.  Negative for cough, chest tightness and shortness of breath.   Cardiovascular: Negative.  Negative for chest pain and palpitations.  Gastrointestinal: Negative.  Negative for abdominal pain, constipation, diarrhea, nausea and vomiting.  Endocrine: Negative.   Genitourinary: Negative.  Negative for dysuria and frequency.  Musculoskeletal: Negative.  Negative for arthralgias, back pain, joint swelling and neck pain.  Skin: Negative.  Negative for rash.  Allergic/Immunologic: Negative.   Neurological: Negative.  Negative for tremors and numbness.  Hematological: Negative for adenopathy. Does not bruise/bleed easily.  Psychiatric/Behavioral: Negative.  Negative for behavioral problems, sleep disturbance and suicidal ideas. The patient is not nervous/anxious.     Vital Signs: BP 125/73   Pulse 81   Temp 97.8 F (36.6 C)   Resp 16   Ht 6\' 3"  (1.905 m)   Wt 204 lb (92.5 kg)   SpO2 93%   BMI 25.50 kg/m    Physical Exam Vitals and nursing note reviewed.  Constitutional:      General: He is not in acute distress.    Appearance: He is well-developed. He is not diaphoretic.  HENT:     Head: Normocephalic and atraumatic.     Mouth/Throat:     Pharynx: No oropharyngeal exudate.  Eyes:     Pupils: Pupils are equal, round, and reactive to light.  Neck:     Thyroid: No thyromegaly.     Vascular: No JVD.     Trachea: No tracheal deviation.  Cardiovascular:     Rate and Rhythm: Normal rate and regular rhythm.     Heart sounds: Normal heart sounds. No murmur. No friction rub. No gallop.   Pulmonary:     Effort: Pulmonary effort is normal. No  respiratory distress.     Breath sounds: Normal breath sounds. No wheezing or rales.  Chest:     Chest wall: No tenderness.  Abdominal:     Palpations: Abdomen is soft.  Musculoskeletal:        General: Normal range of motion.     Cervical back: Normal range of motion and neck supple.     Right lower leg: 1+ Pitting Edema present.     Left lower leg: 1+ Pitting Edema present.  Lymphadenopathy:     Cervical: No cervical adenopathy.  Skin:    General: Skin is warm and dry.  Neurological:     Mental Status: He is alert and oriented to person, place, and time.     Cranial Nerves: No cranial nerve deficit.  Psychiatric:        Behavior: Behavior normal.        Thought Content: Thought content normal.        Judgment: Judgment normal.    Assessment/Plan: 1. Uncontrolled type 2 diabetes mellitus with hyperglycemia ([place]) Instructed the patient to continue increasing his glargine insulin by 2 units every 4-5 days if fasting AM blood glucose levels greater than 140. Instructed him to increase insulin to a maximum dose of 36 units before his next appointment. - POCT HgB A1C - glimepiride (AMARYL) 4 MG tablet; Take 1 tablet (4 mg total) by mouth 2 (two) times daily.  Dispense: 180 tablet; Refill: 1 - Insulin Glargine (BASAGLAR KWIKPEN) 100 UNIT/ML; 36 untis SQ daily  Dispense: 9 mL; Refill: 0  2. Ankle edema, bilateral Echocardiogram ordered due to abnormal EKG at previous appointment as well as complaints of bilateral ankle edema. - ECHOCARDIOGRAM COMPLETE; Future  3. Essential hypertension Well controlled,continue current management.  4. Pure hypercholesterolemia Continue to monitor.   5. Hemangioma of liver Plans to repeat US in 6 months.   General Counseling: teandre mccracken understanding of the findings of todays visit and agrees with plan of treatment. I have discussed any further diagnostic evaluation that may be needed or ordered today. We also reviewed his medications  today. he has been encouraged to call the office with any questions or concerns that should arise related to todays visit.    Orders Placed This Encounter  Procedures  . POCT HgB A1C    No orders of the defined types were placed in this encounter.   Time spent: 20 Minutes   This patient was seen by Orson Gear AGNP-C in Collaboration with Dr Lavera Guise as a part of collaborative care agreement     Kendell Bane AGNP-C Internal medicine

## 2019-08-26 ENCOUNTER — Other Ambulatory Visit: Payer: Self-pay

## 2019-08-26 ENCOUNTER — Telehealth: Payer: Self-pay

## 2019-08-26 DIAGNOSIS — E1165 Type 2 diabetes mellitus with hyperglycemia: Secondary | ICD-10-CM

## 2019-08-26 MED ORDER — BASAGLAR KWIKPEN 100 UNIT/ML ~~LOC~~ SOPN: PEN_INJECTOR | SUBCUTANEOUS | 3 refills | Status: DC

## 2019-08-26 MED ORDER — BD PEN NEEDLE NANO 2ND GEN 32G X 4 MM MISC: 3 refills | Status: DC

## 2019-08-26 NOTE — Telephone Encounter (Signed)
Patient has been advised his prescription has been called in with refills. Beth

## 2019-09-12 ENCOUNTER — Telehealth: Payer: Self-pay

## 2019-09-12 NOTE — Telephone Encounter (Signed)
Confirmed appointment on 09/16/2019 and screened for covid. klh

## 2019-09-16 ENCOUNTER — Encounter: Payer: Self-pay | Admitting: Adult Health

## 2019-09-16 ENCOUNTER — Ambulatory Visit: Payer: BC Managed Care – PPO | Admitting: Adult Health

## 2019-09-16 ENCOUNTER — Other Ambulatory Visit: Payer: Self-pay

## 2019-09-16 VITALS — BP 132/82 | HR 84 | Temp 97.5°F | Resp 16 | Ht 75.0 in | Wt 206.0 lb

## 2019-09-16 DIAGNOSIS — E7800 Pure hypercholesterolemia, unspecified: Secondary | ICD-10-CM

## 2019-09-16 DIAGNOSIS — E1165 Type 2 diabetes mellitus with hyperglycemia: Secondary | ICD-10-CM

## 2019-09-16 DIAGNOSIS — I10 Essential (primary) hypertension: Secondary | ICD-10-CM | POA: Diagnosis not present

## 2019-09-16 LAB — POCT GLYCOSYLATED HEMOGLOBIN (HGB A1C): Hemoglobin A1C: 8.8 % — AB (ref 4.0–5.6)

## 2019-09-16 NOTE — Progress Notes (Signed)
[facility] [place], [place] [address]  Internal MEDICINE  Office Visit Note  Patient Name: Michael Gross  [id number]  [id number]  Date of Service: 09/16/2019  Chief Complaint  Patient presents with  . Diabetes  . Hyperlipidemia    HPI  PT is here for follow up on DM.  He has been taking Insulin Glargine 32 units daily.  His morning blood sugars has ranged from 120-140.  He denies any issues.  He is also taking Invokana an glimepiride.  He had an echo ordered but was unable to make it to the appt.     Current Medication: Outpatient Encounter Medications as of 09/16/2019  Medication Sig  . BAYER MICROLET LANCETS lancets Use as instructed to check blood sugar 2 times per day dx code E11.65  . canagliflozin (INVOKANA) 300 MG TABS tablet Take 1 tablet (300 mg total) by mouth daily.  . cyclobenzaprine (FLEXERIL) 5 MG tablet Take 1 tablet (5 mg total) by mouth at bedtime.  . finasteride (PROSCAR) 5 MG tablet Take 1 tablet (5 mg total) by mouth daily.  Marland Kitchen glimepiride (AMARYL) 4 MG tablet Take 1 tablet (4 mg total) by mouth 2 (two) times daily.  Marland Kitchen glucose blood (BAYER CONTOUR NEXT TEST) test strip Use as instructed to check blood sugar 3 times per day at various different times. dx code E11.65 Bring meter to every visit.  . Insulin Glargine (BASAGLAR KWIKPEN) 100 UNIT/ML 36 untis SQ daily  . Insulin Pen Needle (BD PEN NEEDLE NANO 2ND GEN) 32G X 4 MM MISC Use as directed once a daily ELL.65  . Lancets 30G MISC Use to check blood sugars 5 times per day  . tamsulosin (FLOMAX) 0.4 MG CAPS capsule Take 2 capsules (0.8 mg total) by mouth daily.  . [DISCONTINUED] diazepam (VALIUM) 5 MG tablet Take one tablet one hour before MRI, may repeat dose 15 minutes before MRI if needed.  Must have driver for pre and post procedure.   No facility-administered encounter medications on file as of 09/16/2019.    Surgical History: Past Surgical History:  Procedure Laterality  Date  . HERNIA REPAIR     2003  . KNEE SURGERY     1998  . MOHS SURGERY     2003    Medical History: Past Medical History:  Diagnosis Date  . Basal cell carcinoma   . BPH (benign prostatic hyperplasia)   . Diabetes ([place])   . Hyperlipidemia     Family History: Family History  Problem Relation Age of Onset  . Heart disease Father   . Prostate cancer Neg Hx   . Kidney cancer Neg Hx   . Bladder Cancer Neg Hx     Social History   Socioeconomic History  . Marital status: Married    Spouse name: Not on file  . Number of children: Not on file  . Years of education: Not on file  . Highest education level: Not on file  Occupational History  . Not on file  Tobacco Use  . Smoking status: Never Smoker  . Smokeless tobacco: Never Used  Substance and Sexual Activity  . Alcohol use: Yes    Comment: rare  . Drug use: No  . Sexual activity: Not on file  Other Topics Concern  . Not on file  Social History Narrative  . Not on file   Social Determinants of Health   Financial Resource Strain:   . Difficulty of Paying Living Expenses:  Food Insecurity:   . Worried About Charity fundraiser in the Last Year:   . Arboriculturist in the Last Year:   Transportation Needs:   . Film/video editor (Medical):   Marland Kitchen Lack of Transportation (Non-Medical):   Physical Activity:   . Days of Exercise per Week:   . Minutes of Exercise per Session:   Stress:   . Feeling of Stress :   Social Connections:   . Frequency of Communication with Friends and Family:   . Frequency of Social Gatherings with Friends and Family:   . Attends Religious Services:   . Active Member of Clubs or Organizations:   . Attends Archivist Meetings:   Marland Kitchen Marital Status:   Intimate Partner Violence:   . Fear of Current or Ex-Partner:   . Emotionally Abused:   Marland Kitchen Physically Abused:   . Sexually Abused:       Review of Systems  Constitutional: Negative.  Negative for chills, fatigue and  unexpected weight change.  HENT: Negative.  Negative for congestion, rhinorrhea, sneezing and sore throat.   Eyes: Negative for redness.  Respiratory: Negative.  Negative for cough, chest tightness and shortness of breath.   Cardiovascular: Negative.  Negative for chest pain and palpitations.  Gastrointestinal: Negative.  Negative for abdominal pain, constipation, diarrhea, nausea and vomiting.  Endocrine: Negative.   Genitourinary: Negative.  Negative for dysuria and frequency.  Musculoskeletal: Negative.  Negative for arthralgias, back pain, joint swelling and neck pain.  Skin: Negative.  Negative for rash.  Allergic/Immunologic: Negative.   Neurological: Negative.  Negative for tremors and numbness.  Hematological: Negative for adenopathy. Does not bruise/bleed easily.  Psychiatric/Behavioral: Negative.  Negative for behavioral problems, sleep disturbance and suicidal ideas. The patient is not nervous/anxious.     Vital Signs: BP 132/82   Pulse 84   Temp (!) 97.5 F (36.4 C)   Resp 16   Ht 6\' 3"  (1.905 m)   Wt 206 lb (93.4 kg)   SpO2 93%   BMI 25.75 kg/m    Physical Exam Vitals and nursing note reviewed.  Constitutional:      General: He is not in acute distress.    Appearance: He is well-developed. He is not diaphoretic.  HENT:     Head: Normocephalic and atraumatic.     Mouth/Throat:     Pharynx: No oropharyngeal exudate.  Eyes:     Pupils: Pupils are equal, round, and reactive to light.  Neck:     Thyroid: No thyromegaly.     Vascular: No JVD.     Trachea: No tracheal deviation.  Cardiovascular:     Rate and Rhythm: Normal rate and regular rhythm.     Heart sounds: Normal heart sounds. No murmur. No friction rub. No gallop.   Pulmonary:     Effort: Pulmonary effort is normal. No respiratory distress.     Breath sounds: Normal breath sounds. No wheezing or rales.  Chest:     Chest wall: No tenderness.  Abdominal:     Palpations: Abdomen is soft.      Tenderness: There is no abdominal tenderness. There is no guarding.  Musculoskeletal:        General: Normal range of motion.     Cervical back: Normal range of motion and neck supple.  Lymphadenopathy:     Cervical: No cervical adenopathy.  Skin:    General: Skin is warm and dry.  Neurological:     Mental Status: He  is alert and oriented to person, place, and time.     Cranial Nerves: No cranial nerve deficit.  Psychiatric:        Behavior: Behavior normal.        Thought Content: Thought content normal.        Judgment: Judgment normal.     Assessment/Plan: 1. Type 2 diabetes mellitus with hyperglycemia, unspecified whether long term insulin use (HCC) A1C is improving, continue present management.  - POCT HgB A1C  2. Essential hypertension Stable, continue present management.   3. Pure hypercholesterolemia Continue current surveillance.    General Counseling: zaylon garavito understanding of the findings of todays visit and agrees with plan of treatment. I have discussed any further diagnostic evaluation that may be needed or ordered today. We also reviewed his medications today. he has been encouraged to call the office with any questions or concerns that should arise related to todays visit.    Orders Placed This Encounter  Procedures  . POCT HgB A1C    No orders of the defined types were placed in this encounter.   Time spent: 30 Minutes   This patient was seen by Orson Gear AGNP-C in Collaboration with Dr Lavera Guise as a part of collaborative care agreement     Kendell Bane AGNP-C Internal medicine

## 2019-10-10 ENCOUNTER — Other Ambulatory Visit: Payer: BC Managed Care – PPO

## 2019-10-21 ENCOUNTER — Other Ambulatory Visit: Payer: Self-pay

## 2019-10-21 DIAGNOSIS — E1165 Type 2 diabetes mellitus with hyperglycemia: Secondary | ICD-10-CM

## 2019-10-21 MED ORDER — BASAGLAR KWIKPEN 100 UNIT/ML ~~LOC~~ SOPN: PEN_INJECTOR | SUBCUTANEOUS | 3 refills | Status: DC

## 2019-10-28 ENCOUNTER — Telehealth: Payer: Self-pay

## 2019-10-28 NOTE — Telephone Encounter (Signed)
Confirmed appointment on 10/30/2019 and screened for covid. klh 

## 2019-10-30 ENCOUNTER — Encounter: Payer: Self-pay | Admitting: Adult Health

## 2019-10-30 ENCOUNTER — Other Ambulatory Visit: Payer: Self-pay

## 2019-10-30 ENCOUNTER — Ambulatory Visit: Payer: BC Managed Care – PPO | Admitting: Adult Health

## 2019-10-30 VITALS — BP 124/78 | HR 84 | Temp 97.5°F | Resp 16 | Ht 75.0 in | Wt 211.0 lb

## 2019-10-30 DIAGNOSIS — E7800 Pure hypercholesterolemia, unspecified: Secondary | ICD-10-CM | POA: Diagnosis not present

## 2019-10-30 DIAGNOSIS — I10 Essential (primary) hypertension: Secondary | ICD-10-CM

## 2019-10-30 DIAGNOSIS — E1165 Type 2 diabetes mellitus with hyperglycemia: Secondary | ICD-10-CM

## 2019-10-30 LAB — POCT GLYCOSYLATED HEMOGLOBIN (HGB A1C): Hemoglobin A1C: 9 % — AB (ref 4.0–5.6)

## 2019-10-30 NOTE — Progress Notes (Signed)
[facility] [place], [place] [address]  Internal MEDICINE  Office Visit Note  Patient Name: Michael Gross  [id number]  [id number]  Date of Service: 10/30/2019  Chief Complaint  Patient presents with  . Follow-up  . Diabetes  . Hyperlipidemia    HPI  Pt is here for follow up on DM, HLD.  His A1C is 9.0 today.  He recently went on a trip to [place] to see his daughter who is in the [place].  He ate many things he shouldn't have, and he is trying to get his sugars back under control. He denies any new or worsening symptoms at this time.     Current Medication: Outpatient Encounter Medications as of 10/30/2019  Medication Sig  . BAYER MICROLET LANCETS lancets Use as instructed to check blood sugar 2 times per day dx code E11.65  . canagliflozin (INVOKANA) 300 MG TABS tablet Take 1 tablet (300 mg total) by mouth daily.  . cyclobenzaprine (FLEXERIL) 5 MG tablet Take 1 tablet (5 mg total) by mouth at bedtime.  . finasteride (PROSCAR) 5 MG tablet Take 1 tablet (5 mg total) by mouth daily.  Marland Kitchen glimepiride (AMARYL) 4 MG tablet Take 1 tablet (4 mg total) by mouth 2 (two) times daily.  Marland Kitchen glucose blood (BAYER CONTOUR NEXT TEST) test strip Use as instructed to check blood sugar 3 times per day at various different times. dx code E11.65 Bring meter to every visit.  . Insulin Glargine (BASAGLAR KWIKPEN) 100 UNIT/ML 36 untis SQ daily  . Insulin Pen Needle (BD PEN NEEDLE NANO 2ND GEN) 32G X 4 MM MISC Use as directed once a daily ELL.65  . Lancets 30G MISC Use to check blood sugars 5 times per day  . tamsulosin (FLOMAX) 0.4 MG CAPS capsule Take 2 capsules (0.8 mg total) by mouth daily.   No facility-administered encounter medications on file as of 10/30/2019.    Surgical History: Past Surgical History:  Procedure Laterality Date  . HERNIA REPAIR     2003  . KNEE SURGERY     1998  . MOHS SURGERY     2003    Medical History: Past Medical History:  Diagnosis  Date  . Basal cell carcinoma   . BPH (benign prostatic hyperplasia)   . Diabetes ([place])   . Hyperlipidemia     Family History: Family History  Problem Relation Age of Onset  . Heart disease Father   . Prostate cancer Neg Hx   . Kidney cancer Neg Hx   . Bladder Cancer Neg Hx     Social History   Socioeconomic History  . Marital status: Married    Spouse name: Not on file  . Number of children: Not on file  . Years of education: Not on file  . Highest education level: Not on file  Occupational History  . Not on file  Tobacco Use  . Smoking status: Never Smoker  . Smokeless tobacco: Never Used  Vaping Use  . Vaping Use: Never used  Substance and Sexual Activity  . Alcohol use: Yes    Comment: rare  . Drug use: No  . Sexual activity: Not on file  Other Topics Concern  . Not on file  Social History Narrative  . Not on file   Social Determinants of Health   Financial Resource Strain:   . Difficulty of Paying Living Expenses:   Food Insecurity:   . Worried About Charity fundraiser in the Last  Year:   . Ran Out of Food in the Last Year:   Transportation Needs:   . Film/video editor (Medical):   Marland Kitchen Lack of Transportation (Non-Medical):   Physical Activity:   . Days of Exercise per Week:   . Minutes of Exercise per Session:   Stress:   . Feeling of Stress :   Social Connections:   . Frequency of Communication with Friends and Family:   . Frequency of Social Gatherings with Friends and Family:   . Attends Religious Services:   . Active Member of Clubs or Organizations:   . Attends Archivist Meetings:   Marland Kitchen Marital Status:   Intimate Partner Violence:   . Fear of Current or Ex-Partner:   . Emotionally Abused:   Marland Kitchen Physically Abused:   . Sexually Abused:       Review of Systems  Constitutional: Negative.  Negative for chills, fatigue and unexpected weight change.  HENT: Negative.  Negative for congestion, rhinorrhea, sneezing and sore throat.    Eyes: Negative for redness.  Respiratory: Negative.  Negative for cough, chest tightness and shortness of breath.   Cardiovascular: Negative.  Negative for chest pain and palpitations.  Gastrointestinal: Negative.  Negative for abdominal pain, constipation, diarrhea, nausea and vomiting.  Endocrine: Negative.   Genitourinary: Negative.  Negative for dysuria and frequency.  Musculoskeletal: Negative.  Negative for arthralgias, back pain, joint swelling and neck pain.  Skin: Negative.  Negative for rash.  Allergic/Immunologic: Negative.   Neurological: Negative.  Negative for tremors and numbness.  Hematological: Negative for adenopathy. Does not bruise/bleed easily.  Psychiatric/Behavioral: Negative.  Negative for behavioral problems, sleep disturbance and suicidal ideas. The patient is not nervous/anxious.     Vital Signs: BP 124/78   Pulse 84   Temp (!) 97.5 F (36.4 C)   Resp 16   Ht 6\' 3"  (1.905 m)   Wt 211 lb (95.7 kg)   SpO2 92%   BMI 26.37 kg/m    Physical Exam Vitals and nursing note reviewed.  Constitutional:      General: He is not in acute distress.    Appearance: He is well-developed. He is not diaphoretic.  HENT:     Head: Normocephalic and atraumatic.     Mouth/Throat:     Pharynx: No oropharyngeal exudate.  Eyes:     Pupils: Pupils are equal, round, and reactive to light.  Neck:     Thyroid: No thyromegaly.     Vascular: No JVD.     Trachea: No tracheal deviation.  Cardiovascular:     Rate and Rhythm: Normal rate and regular rhythm.     Heart sounds: Normal heart sounds. No murmur heard.  No friction rub. No gallop.   Pulmonary:     Effort: Pulmonary effort is normal. No respiratory distress.     Breath sounds: Normal breath sounds. No wheezing or rales.  Chest:     Chest wall: No tenderness.  Abdominal:     Palpations: Abdomen is soft.     Tenderness: There is no abdominal tenderness. There is no guarding.  Musculoskeletal:        General: Normal  range of motion.     Cervical back: Normal range of motion and neck supple.  Lymphadenopathy:     Cervical: No cervical adenopathy.  Skin:    General: Skin is warm and dry.  Neurological:     Mental Status: He is alert and oriented to person, place, and time.  Cranial Nerves: No cranial nerve deficit.  Psychiatric:        Behavior: Behavior normal.        Thought Content: Thought content normal.        Judgment: Judgment normal.    Assessment/Plan: 1. Type 2 diabetes mellitus with hyperglycemia, unspecified whether long term insulin use (HCC) A1C is up today.  Patient has been taking one glyburide tablet daily instead of Bid.  Will increase.   - POCT HgB A1C  2. Essential hypertension Stable, continue current management.   3. Pure hypercholesterolemia Monitor lipid panel at next blood draw.  General Counseling: casson catena understanding of the findings of todays visit and agrees with plan of treatment. I have discussed any further diagnostic evaluation that may be needed or ordered today. We also reviewed his medications today. he has been encouraged to call the office with any questions or concerns that should arise related to todays visit.    Orders Placed This Encounter  Procedures  . POCT HgB A1C    No orders of the defined types were placed in this encounter.   Time spent: 30 Minutes   This patient was seen by Orson Gear AGNP-C in Collaboration with Dr Lavera Guise as a part of collaborative care agreement     Kendell Bane AGNP-C Internal medicine

## 2019-11-12 ENCOUNTER — Telehealth: Payer: Self-pay

## 2019-11-12 NOTE — Telephone Encounter (Signed)
Confirmed patient ultrasound appt, per pt may call back on Thursday to cancel.

## 2019-11-14 ENCOUNTER — Other Ambulatory Visit: Payer: Self-pay

## 2019-11-14 ENCOUNTER — Ambulatory Visit: Payer: BC Managed Care – PPO

## 2019-11-14 DIAGNOSIS — R600 Localized edema: Secondary | ICD-10-CM | POA: Diagnosis not present

## 2019-11-14 DIAGNOSIS — M25472 Effusion, left ankle: Secondary | ICD-10-CM

## 2019-11-27 ENCOUNTER — Other Ambulatory Visit: Payer: Self-pay

## 2019-11-27 MED ORDER — LANTUS SOLOSTAR 100 UNIT/ML ~~LOC~~ SOPN: 36.0000 [IU] | PEN_INJECTOR | Freq: Every day | SUBCUTANEOUS | 3 refills | Status: DC

## 2019-12-01 ENCOUNTER — Other Ambulatory Visit: Payer: Self-pay | Admitting: Internal Medicine

## 2019-12-01 DIAGNOSIS — Z0001 Encounter for general adult medical examination with abnormal findings: Secondary | ICD-10-CM

## 2019-12-01 DIAGNOSIS — E1165 Type 2 diabetes mellitus with hyperglycemia: Secondary | ICD-10-CM

## 2019-12-01 DIAGNOSIS — Z125 Encounter for screening for malignant neoplasm of prostate: Secondary | ICD-10-CM

## 2019-12-01 DIAGNOSIS — E7800 Pure hypercholesterolemia, unspecified: Secondary | ICD-10-CM

## 2019-12-01 DIAGNOSIS — I10 Essential (primary) hypertension: Secondary | ICD-10-CM

## 2019-12-01 DIAGNOSIS — N529 Male erectile dysfunction, unspecified: Secondary | ICD-10-CM

## 2020-01-26 ENCOUNTER — Encounter: Payer: BC Managed Care – PPO | Admitting: Adult Health

## 2020-01-29 ENCOUNTER — Ambulatory Visit: Payer: BC Managed Care – PPO | Admitting: Adult Health

## 2020-02-05 ENCOUNTER — Encounter: Payer: BC Managed Care – PPO | Admitting: Hospice and Palliative Medicine

## 2020-02-10 ENCOUNTER — Other Ambulatory Visit: Payer: Self-pay | Admitting: Adult Health

## 2020-02-10 DIAGNOSIS — E1165 Type 2 diabetes mellitus with hyperglycemia: Secondary | ICD-10-CM

## 2020-03-11 ENCOUNTER — Other Ambulatory Visit: Payer: Self-pay

## 2020-03-11 MED ORDER — CANAGLIFLOZIN 300 MG PO TABS: 300.0000 mg | ORAL_TABLET | Freq: Every day | ORAL | 5 refills | Status: DC

## 2020-04-05 ENCOUNTER — Encounter: Payer: Self-pay | Admitting: Hospice and Palliative Medicine

## 2020-04-05 ENCOUNTER — Other Ambulatory Visit: Payer: Self-pay

## 2020-04-05 ENCOUNTER — Ambulatory Visit (INDEPENDENT_AMBULATORY_CARE_PROVIDER_SITE_OTHER): Payer: BC Managed Care – PPO | Admitting: Hospice and Palliative Medicine

## 2020-04-05 VITALS — BP 132/84 | HR 82 | Temp 97.5°F | Resp 16 | Ht 75.0 in | Wt 212.0 lb

## 2020-04-05 DIAGNOSIS — R5383 Other fatigue: Secondary | ICD-10-CM | POA: Diagnosis not present

## 2020-04-05 DIAGNOSIS — Z125 Encounter for screening for malignant neoplasm of prostate: Secondary | ICD-10-CM

## 2020-04-05 DIAGNOSIS — R300 Dysuria: Secondary | ICD-10-CM | POA: Diagnosis not present

## 2020-04-05 DIAGNOSIS — E1165 Type 2 diabetes mellitus with hyperglycemia: Secondary | ICD-10-CM | POA: Diagnosis not present

## 2020-04-05 DIAGNOSIS — Z0001 Encounter for general adult medical examination with abnormal findings: Secondary | ICD-10-CM

## 2020-04-05 DIAGNOSIS — Z79899 Other long term (current) drug therapy: Secondary | ICD-10-CM

## 2020-04-05 LAB — POCT GLYCOSYLATED HEMOGLOBIN (HGB A1C): Hemoglobin A1C: 10.5 % — AB (ref 4.0–5.6)

## 2020-04-05 NOTE — Progress Notes (Signed)
[facility] [place],  [address]  Internal MEDICINE  Office Visit Note  Patient Name: Michael Gross  [id number]  [id number]  Date of Service: 04/09/2020  Chief Complaint  Patient presents with  . Annual Exam  . Diabetes  . Hyperlipidemia  . policy update form    received  . Quality Metric Gaps    eye exam, foot exam     HPI Pt is here for routine health maintenance examination Overall, things have been going well No recent changes in his medications or history Has been without his Invokana and Lantus for sometime due to insurance company and pharmacy not having mediations ready Has been using lantus 34-36 units a day, only takes glimepiride once a day as he noticed when he was taking a second dose it was causing his glucose levels to drop significantly  Not routinely followed by other providers PHM: Colonoscopy-2019, normal, repeat screening 2029 Foot exam performed today Needs annual diabetic eye exam  Current Medication: Outpatient Encounter Medications as of 04/05/2020  Medication Sig  . Ascorbic Acid (VITAMIN C) 100 MG tablet Take 100 mg by mouth daily.  Marland Kitchen BAYER MICROLET LANCETS lancets Use as instructed to check blood sugar 2 times per day dx code E11.65  . canagliflozin (INVOKANA) 300 MG TABS tablet Take 1 tablet (300 mg total) by mouth daily.  . cholecalciferol (VITAMIN D3) 25 MCG (1000 UNIT) tablet Take 1,000 Units by mouth daily.  . finasteride (PROSCAR) 5 MG tablet Take 1 tablet (5 mg total) by mouth daily.  Marland Kitchen glimepiride (AMARYL) 4 MG tablet TAKE 1 TABLET(4 MG) BY MOUTH TWICE DAILY  . glucose blood (BAYER CONTOUR NEXT TEST) test strip Use as instructed to check blood sugar 3 times per day at various different times. dx code E11.65 Bring meter to every visit.  Marland Kitchen insulin glargine (LANTUS SOLOSTAR) 100 UNIT/ML Solostar Pen Inject 36 Units into the skin daily.  . Insulin Pen Needle (BD PEN NEEDLE NANO 2ND GEN) 32G X 4 MM MISC  Use as directed once a daily ELL.65  . Lancets 30G MISC Use to check blood sugars 5 times per day  . zinc gluconate 50 MG tablet Take 50 mg by mouth daily.  . cyclobenzaprine (FLEXERIL) 5 MG tablet Take 1 tablet (5 mg total) by mouth at bedtime. (Patient not taking: Reported on 04/05/2020)  . tamsulosin (FLOMAX) 0.4 MG CAPS capsule Take 2 capsules (0.8 mg total) by mouth daily. (Patient not taking: Reported on 04/05/2020)   No facility-administered encounter medications on file as of 04/05/2020.    Surgical History: Past Surgical History:  Procedure Laterality Date  . HERNIA REPAIR     2003  . KNEE SURGERY     1998  . MOHS SURGERY     2003    Medical History: Past Medical History:  Diagnosis Date  . Basal cell carcinoma   . BPH (benign prostatic hyperplasia)   . Diabetes ([place])   . Hyperlipidemia     Family History: Family History  Problem Relation Age of Onset  . Heart disease Father   . Prostate cancer Neg Hx   . Kidney cancer Neg Hx   . Bladder Cancer Neg Hx     Review of Systems  Constitutional: Negative for chills, fatigue and unexpected weight change.  HENT: Negative for congestion, postnasal drip, rhinorrhea, sneezing and sore throat.   Eyes: Negative for redness.  Respiratory: Negative for cough, chest tightness and shortness of breath.   Cardiovascular:  Negative for chest pain and palpitations.  Gastrointestinal: Negative for abdominal pain, constipation, diarrhea, nausea and vomiting.  Genitourinary: Negative for dysuria and frequency.  Musculoskeletal: Negative for arthralgias, back pain, joint swelling and neck pain.  Skin: Negative for color change and rash.  Neurological: Negative for tremors and numbness.  Hematological: Negative for adenopathy. Does not bruise/bleed easily.  Psychiatric/Behavioral: Negative for behavioral problems (Depression), sleep disturbance and suicidal ideas. The patient is not nervous/anxious.      Vital Signs: BP 132/84    Pulse 82   Temp (!) 97.5 F (36.4 C)   Resp 16   Ht 6\' 3"  (1.905 m)   Wt 212 lb (96.2 kg)   SpO2 98%   BMI 26.50 kg/m    Physical Exam Vitals reviewed.  Constitutional:      Appearance: Normal appearance. He is normal weight.  HENT:     Right Ear: Tympanic membrane normal.     Left Ear: Tympanic membrane normal.     Nose: Nose normal.     Mouth/Throat:     Mouth: Mucous membranes are moist.     Pharynx: Oropharynx is clear.  Eyes:     Pupils: Pupils are equal, round, and reactive to light.  Cardiovascular:     Rate and Rhythm: Normal rate and regular rhythm.     Pulses: Normal pulses.     Heart sounds: Normal heart sounds.  Pulmonary:     Effort: Pulmonary effort is normal.     Breath sounds: Normal breath sounds.  Abdominal:     General: Abdomen is flat.     Palpations: Abdomen is soft.  Musculoskeletal:        General: Normal range of motion.     Cervical back: Normal range of motion.  Skin:    General: Skin is warm.  Neurological:     General: No focal deficit present.     Mental Status: He is alert and oriented to person, place, and time. Mental status is at baseline.  Psychiatric:        Mood and Affect: Mood normal.        Behavior: Behavior normal.        Thought Content: Thought content normal.        Judgment: Judgment normal.    LABS: Recent Results (from the past 2160 hour(s))  POCT HgB A1C     Status: Abnormal   Collection Time: 04/05/20  4:14 PM  Result Value Ref Range   Hemoglobin A1C 10.5 (A) 4.0 - 5.6 %   HbA1c POC (<> result, manual entry)     HbA1c, POC (prediabetic range)     HbA1c, POC (controlled diabetic range)    UA/M w/rflx Culture, Routine     Status: Abnormal   Collection Time: 04/05/20  4:26 PM   Specimen: Urine   Urine  Result Value Ref Range   Specific Gravity, UA      >=1.030 (A) 1.005 - 1.030   pH, UA 5.0 5.0 - 7.5   Color, UA Yellow Yellow   Appearance Ur Clear Clear   Leukocytes,UA Negative Negative   Protein,UA  Negative Negative/Trace   Glucose, UA 3+ (A) Negative   Ketones, UA Trace (A) Negative   RBC, UA Negative Negative   Bilirubin, UA Negative Negative   Urobilinogen, Ur 0.2 0.2 - 1.0 mg/dL   Nitrite, UA Negative Negative   Microscopic Examination Comment     Comment: Microscopic follows if indicated.   Microscopic Examination See below:  Comment: Microscopic was indicated and was performed.   Urinalysis Reflex Comment     Comment: This specimen will not reflex to a Urine Culture.  Microscopic Examination     Status: None   Collection Time: 04/05/20  4:26 PM   Urine  Result Value Ref Range   WBC, UA None seen 0 - 5 /hpf   RBC None seen 0 - 2 /hpf   Epithelial Cells (non renal) None seen 0 - 10 /hpf   Casts None seen None seen /lpf   Bacteria, UA None seen None seen/Few    Assessment/Plan: 1. Encounter for routine adult health examination with abnormal findings Well appearing 64 year old male Will review annual labs and adjust plan of care as indicated Up to date on PHM - CBC w/Diff/Platelet - Comprehensive Metabolic Panel (CMET) - Lipid Panel With LDL/HDL Ratio - TSH + free T4 - PSA Total (Reflex To Free)  2. Uncontrolled type 2 diabetes mellitus with hyperglycemia (HCC) A1C uncontrolled at 10.5--he has been without Lantus and Invokana, advised to always contact office for reference as he cannot be without his medications Advised to use 36 units of Lantus and titrate up by 2 units, do not exceed 40 units for a goal glucose level of 150 or less Referral for annual diabetic eye exam screening - POCT HgB A1C - Ambulatory referral to Ophthalmology  3. Dysuria - UA/M w/rflx Culture, Routine - Microscopic Examination  4. Fatigue, unspecified type - CBC w/Diff/Platelet - TSH + free T4  5. Encounter for long-term (current) use of high-risk medication - Comprehensive Metabolic Panel (CMET) - Lipid Panel With LDL/HDL Ratio  6. Screening for prostate cancer - PSA Total  (Reflex To Free)  General Counseling: shaylon aden understanding of the findings of todays visit and agrees with plan of treatment. I have discussed any further diagnostic evaluation that may be needed or ordered today. We also reviewed his medications today. he has been encouraged to call the office with any questions or concerns that should arise related to todays visit.    Counseling:    Orders Placed This Encounter  Procedures  . Microscopic Examination  . UA/M w/rflx Culture, Routine  . CBC w/Diff/Platelet  . Comprehensive Metabolic Panel (CMET)  . Lipid Panel With LDL/HDL Ratio  . TSH + free T4  . PSA Total (Reflex To Free)  . Ambulatory referral to Ophthalmology  . POCT HgB A1C      Total time spent: 30 Minutes  Time spent includes review of chart, medications, test results, and follow up plan with the patient.   This patient was seen by Casey Burkitt AGNP-C Collaboration with Dr Lavera Guise as a part of collaborative care agreement   Tanna Furry. [facility] Internal Medicine

## 2020-04-06 LAB — UA/M W/RFLX CULTURE, ROUTINE
Bilirubin, UA: NEGATIVE
Leukocytes,UA: NEGATIVE
Nitrite, UA: NEGATIVE
Protein,UA: NEGATIVE
RBC, UA: NEGATIVE
Specific Gravity, UA: >=1.03 — AB (ref 1.005–1.030)
Urobilinogen, Ur: 0.2 mg/dL (ref 0.2–1.0)
pH, UA: 5 (ref 5.0–7.5)

## 2020-04-06 LAB — MICROSCOPIC EXAMINATION
Bacteria, UA: NONE SEEN
Casts: NONE SEEN /lpf
Epithelial Cells (non renal): NONE SEEN /hpf (ref 0–10)
RBC, Urine: NONE SEEN /hpf (ref 0–2)
WBC, UA: NONE SEEN /hpf (ref 0–5)

## 2020-04-09 ENCOUNTER — Encounter: Payer: Self-pay | Admitting: Hospice and Palliative Medicine

## 2020-04-13 DIAGNOSIS — Z0001 Encounter for general adult medical examination with abnormal findings: Secondary | ICD-10-CM | POA: Diagnosis not present

## 2020-04-14 LAB — CBC WITH DIFFERENTIAL/PLATELET
Basophils Absolute: 0.1 10*3/uL (ref 0.0–0.2)
Basos: 1 %
EOS (ABSOLUTE): 0.2 10*3/uL (ref 0.0–0.4)
Eos: 2 %
Hematocrit: 49.3 % (ref 37.5–51.0)
Hemoglobin: 17.3 g/dL (ref 13.0–17.7)
Immature Grans (Abs): 0 10*3/uL (ref 0.0–0.1)
Immature Granulocytes: 0 %
Lymphocytes Absolute: 3 10*3/uL (ref 0.7–3.1)
Lymphs: 46 %
MCH: 30.4 pg (ref 26.6–33.0)
MCHC: 35.1 g/dL (ref 31.5–35.7)
MCV: 87 fL (ref 79–97)
Monocytes Absolute: 0.5 10*3/uL (ref 0.1–0.9)
Monocytes: 8 %
Neutrophils Absolute: 2.8 10*3/uL (ref 1.4–7.0)
Neutrophils: 43 %
Platelets: 192 10*3/uL (ref 150–450)
RBC: 5.69 x10E6/uL (ref 4.14–5.80)
RDW: 12.4 % (ref 11.6–15.4)
WBC: 6.4 10*3/uL (ref 3.4–10.8)

## 2020-04-14 LAB — COMPREHENSIVE METABOLIC PANEL
ALT: 19 IU/L (ref 0–44)
AST: 18 IU/L (ref 0–40)
Albumin/Globulin Ratio: 1.8 (ref 1.2–2.2)
Albumin: 4.2 g/dL (ref 3.8–4.8)
Alkaline Phosphatase: 89 IU/L (ref 44–121)
BUN/Creatinine Ratio: 16 (ref 10–24)
BUN: 14 mg/dL (ref 8–27)
Bilirubin Total: 0.4 mg/dL (ref 0.0–1.2)
CO2: 23 mmol/L (ref 20–29)
Calcium: 9.3 mg/dL (ref 8.6–10.2)
Chloride: 103 mmol/L (ref 96–106)
Creatinine, Ser: 0.87 mg/dL (ref 0.76–1.27)
GFR calc Af Amer: 105 mL/min/{1.73_m2} (ref >59)
GFR calc non Af Amer: 91 mL/min/{1.73_m2} (ref >59)
Globulin, Total: 2.4 g/dL (ref 1.5–4.5)
Glucose: 142 mg/dL — ABNORMAL HIGH (ref 65–99)
Potassium: 4.4 mmol/L (ref 3.5–5.2)
Sodium: 139 mmol/L (ref 134–144)
Total Protein: 6.6 g/dL (ref 6.0–8.5)

## 2020-04-14 LAB — LIPID PANEL WITH LDL/HDL RATIO
Cholesterol, Total: 326 mg/dL — ABNORMAL HIGH (ref 100–199)
HDL: 46 mg/dL (ref >39)
LDL Chol Calc (NIH): 258 mg/dL — ABNORMAL HIGH (ref 0–99)
LDL/HDL Ratio: 5.6 ratio — ABNORMAL HIGH (ref 0.0–3.6)
Triglycerides: 120 mg/dL (ref 0–149)
VLDL Cholesterol Cal: 22 mg/dL (ref 5–40)

## 2020-04-14 LAB — TSH+FREE T4
Free T4: 1.22 ng/dL (ref 0.82–1.77)
TSH: 1.25 u[IU]/mL (ref 0.450–4.500)

## 2020-04-14 LAB — PSA TOTAL (REFLEX TO FREE): Prostate Specific Ag, Serum: 3 ng/mL (ref 0.0–4.0)

## 2020-04-19 ENCOUNTER — Telehealth: Payer: Self-pay

## 2020-04-19 ENCOUNTER — Ambulatory Visit: Payer: BC Managed Care – PPO | Admitting: Hospice and Palliative Medicine

## 2020-04-19 NOTE — Telephone Encounter (Signed)
Lmom to rs missed ov on 04-19-20. clt

## 2020-05-12 ENCOUNTER — Other Ambulatory Visit: Payer: Self-pay | Admitting: Adult Health

## 2020-05-12 DIAGNOSIS — N400 Enlarged prostate without lower urinary tract symptoms: Secondary | ICD-10-CM

## 2020-05-13 ENCOUNTER — Other Ambulatory Visit: Payer: Self-pay

## 2020-05-13 ENCOUNTER — Encounter: Payer: Self-pay | Admitting: Hospice and Palliative Medicine

## 2020-05-13 ENCOUNTER — Ambulatory Visit: Payer: BC Managed Care – PPO | Admitting: Hospice and Palliative Medicine

## 2020-05-13 VITALS — BP 132/74 | HR 85 | Temp 98.1°F | Resp 16 | Ht 75.0 in | Wt 210.2 lb

## 2020-05-13 DIAGNOSIS — E1165 Type 2 diabetes mellitus with hyperglycemia: Secondary | ICD-10-CM

## 2020-05-13 DIAGNOSIS — I10 Essential (primary) hypertension: Secondary | ICD-10-CM

## 2020-05-13 DIAGNOSIS — E782 Mixed hyperlipidemia: Secondary | ICD-10-CM

## 2020-05-13 MED ORDER — ROSUVASTATIN CALCIUM 5 MG PO TABS: 5.0000 mg | ORAL_TABLET | Freq: Every day | ORAL | 3 refills | Status: DC

## 2020-05-13 NOTE — Progress Notes (Signed)
[facility] [address] [place], [place] [address]  Internal MEDICINE  Office Visit Note  Patient Name: Michael Gross  [id number]  [id number]  Date of Service: 05/17/2020  Chief Complaint  Patient presents with  . Diabetes  . Hyperlipidemia    HPI Patient is here for routine follow-up Close follow-up from last visit for hyperglycemia--A1C 10.5, restarted Invokana and Lantus, he had been without refills for several weeks Home glucose levels have been averaging 150-200 Complaints of increased urinary frequency since restarting Invokana, he is getting up every 3-4 hours throughout the night to use the bathroom  His sleep remains poor-has had a sleep study in the past that was negative Has not yet had his eye exam--they are booked out until the summer  Reviewed his recent labs--abnormal lipid panel, increase in PSA but remains normal, less than 5  Stop invokana start 5 units Humalog insulin each meal three times a day 40 units of lantus   Current Medication: Outpatient Encounter Medications as of 05/13/2020  Medication Sig  . Ascorbic Acid (VITAMIN C) 100 MG tablet Take 100 mg by mouth daily.  Marland Kitchen BAYER MICROLET LANCETS lancets Use as instructed to check blood sugar 2 times per day dx code E11.65  . canagliflozin (INVOKANA) 300 MG TABS tablet Take 1 tablet (300 mg total) by mouth daily.  . cholecalciferol (VITAMIN D3) 25 MCG (1000 UNIT) tablet Take 1,000 Units by mouth daily.  . cyclobenzaprine (FLEXERIL) 5 MG tablet Take 1 tablet (5 mg total) by mouth at bedtime.  . finasteride (PROSCAR) 5 MG tablet Take 1 tablet (5 mg total) by mouth daily.  Marland Kitchen glimepiride (AMARYL) 4 MG tablet TAKE 1 TABLET(4 MG) BY MOUTH TWICE DAILY  . glucose blood (BAYER CONTOUR NEXT TEST) test strip Use as instructed to check blood sugar 3 times per day at various different times. dx code E11.65 Bring meter to every visit.  Marland Kitchen insulin glargine (LANTUS SOLOSTAR) 100 UNIT/ML Solostar Pen Inject  36 Units into the skin daily.  . Insulin Pen Needle (BD PEN NEEDLE NANO 2ND GEN) 32G X 4 MM MISC Use as directed once a daily ELL.65  . Lancets 30G MISC Use to check blood sugars 5 times per day  . rosuvastatin (CRESTOR) 5 MG tablet Take 1 tablet (5 mg total) by mouth daily.  . tamsulosin (FLOMAX) 0.4 MG CAPS capsule Take 2 capsules (0.8 mg total) by mouth daily.  Marland Kitchen zinc gluconate 50 MG tablet Take 50 mg by mouth daily.   No facility-administered encounter medications on file as of 05/13/2020.    Surgical History: Past Surgical History:  Procedure Laterality Date  . HERNIA REPAIR     2003  . KNEE SURGERY     1998  . MOHS SURGERY     2003    Medical History: Past Medical History:  Diagnosis Date  . Basal cell carcinoma   . BPH (benign prostatic hyperplasia)   . Diabetes (HCC)   . Hyperlipidemia     Family History: Family History  Problem Relation Age of Onset  . Heart disease Father   . Prostate cancer Neg Hx   . Kidney cancer Neg Hx   . Bladder Cancer Neg Hx     Social History   Socioeconomic History  . Marital status: Married    Spouse name: Not on file  . Number of children: Not on file  . Years of education: Not on file  . Highest education level: Not on file  Occupational  History  . Not on file  Tobacco Use  . Smoking status: Never Smoker  . Smokeless tobacco: Never Used  Vaping Use  . Vaping Use: Never used  Substance and Sexual Activity  . Alcohol use: Yes    Comment: rare  . Drug use: No  . Sexual activity: Not on file  Other Topics Concern  . Not on file  Social History Narrative  . Not on file   Social Determinants of Health   Financial Resource Strain: Not on file  Food Insecurity: Not on file  Transportation Needs: Not on file  Physical Activity: Not on file  Stress: Not on file  Social Connections: Not on file  Intimate Partner Violence: Not on file    Review of Systems  Constitutional: Negative for chills, fatigue and unexpected  weight change.  HENT: Negative for congestion, postnasal drip, rhinorrhea, sneezing and sore throat.   Eyes: Negative for photophobia, redness and visual disturbance.  Respiratory: Negative for cough, chest tightness and shortness of breath.   Cardiovascular: Negative for chest pain, palpitations and leg swelling.  Gastrointestinal: Negative for abdominal pain, constipation, diarrhea, nausea and vomiting.  Genitourinary: Positive for frequency. Negative for dysuria.  Musculoskeletal: Negative for arthralgias, back pain, joint swelling and neck pain.  Skin: Negative for rash.  Neurological: Negative for tremors and numbness.  Hematological: Negative for adenopathy. Does not bruise/bleed easily.  Psychiatric/Behavioral: Negative for behavioral problems (Depression), sleep disturbance and suicidal ideas. The patient is not nervous/anxious.     Vital Signs: BP 132/74   Pulse 85   Temp 98.1 F (36.7 C)   Resp 16   Ht 6\' 3"  (1.905 m)   Wt 210 lb 3.2 oz (95.3 kg)   SpO2 97%   BMI 26.27 kg/m    Physical Exam Vitals reviewed.  Constitutional:      Appearance: Normal appearance. He is obese.  Cardiovascular:     Rate and Rhythm: Normal rate and regular rhythm.     Pulses: Normal pulses.     Heart sounds: Normal heart sounds.  Pulmonary:     Effort: Pulmonary effort is normal.     Breath sounds: Normal breath sounds.  Abdominal:     General: Abdomen is flat.     Palpations: Abdomen is soft.  Musculoskeletal:        General: Normal range of motion.     Cervical back: Normal range of motion.  Skin:    General: Skin is warm.  Neurological:     General: No focal deficit present.     Mental Status: He is alert and oriented to person, place, and time. Mental status is at baseline.  Psychiatric:        Mood and Affect: Mood normal.        Behavior: Behavior normal.        Thought Content: Thought content normal.        Judgment: Judgment normal.    Assessment/Plan: 1.  Uncontrolled type 2 diabetes mellitus with hyperglycemia ([place]) Stop Invokana, start 5 units Humalog with each meal three times daily--samples provided today Continue with Lantus 40 units daily Will have follow-up in 2 weeks to review home glucose readings  2. Mixed hyperlipidemia Start low dose rosuvastatin for abnormal lipid panel Will need to consider further vascular studies - rosuvastatin (CRESTOR) 5 MG tablet; Take 1 tablet (5 mg total) by mouth daily.  Dispense: 90 tablet; Refill: 3  3. Essential hypertension BP and HR remain well controlled today continue to  monitor  General Counseling: antares loy understanding of the findings of todays visit and agrees with plan of treatment. I have discussed any further diagnostic evaluation that may be needed or ordered today. We also reviewed his medications today. he has been encouraged to call the office with any questions or concerns that should arise related to todays visit.   Meds ordered this encounter  Medications  . rosuvastatin (CRESTOR) 5 MG tablet    Sig: Take 1 tablet (5 mg total) by mouth daily.    Dispense:  90 tablet    Refill:  3    Time spent: 30 Minutes Time spent includes review of chart, medications, test results and follow-up plan with the patient.  This patient was seen by Theodoro Grist AGNP-C in Collaboration with Dr Lavera Guise as a part of collaborative care agreement     Tanna Furry. Trevian Hayashida AGNP-C Internal medicine

## 2020-05-17 ENCOUNTER — Encounter: Payer: Self-pay | Admitting: Hospice and Palliative Medicine

## 2020-05-29 ENCOUNTER — Other Ambulatory Visit: Payer: Self-pay | Admitting: Adult Health

## 2020-05-31 ENCOUNTER — Other Ambulatory Visit: Payer: Self-pay | Admitting: Adult Health

## 2020-05-31 DIAGNOSIS — N400 Enlarged prostate without lower urinary tract symptoms: Secondary | ICD-10-CM

## 2020-06-03 ENCOUNTER — Ambulatory Visit: Payer: BC Managed Care – PPO | Admitting: Hospice and Palliative Medicine

## 2020-06-09 ENCOUNTER — Other Ambulatory Visit: Payer: Self-pay | Admitting: Adult Health

## 2020-06-09 DIAGNOSIS — N400 Enlarged prostate without lower urinary tract symptoms: Secondary | ICD-10-CM

## 2020-06-16 ENCOUNTER — Ambulatory Visit (INDEPENDENT_AMBULATORY_CARE_PROVIDER_SITE_OTHER): Payer: BC Managed Care – PPO | Admitting: Hospice and Palliative Medicine

## 2020-06-16 ENCOUNTER — Encounter: Payer: Self-pay | Admitting: Hospice and Palliative Medicine

## 2020-06-16 ENCOUNTER — Other Ambulatory Visit: Payer: Self-pay

## 2020-06-16 VITALS — BP 132/82 | HR 82 | Temp 97.4°F | Resp 16 | Ht 75.0 in | Wt 217.4 lb

## 2020-06-16 DIAGNOSIS — D229 Melanocytic nevi, unspecified: Secondary | ICD-10-CM | POA: Diagnosis not present

## 2020-06-16 DIAGNOSIS — R350 Frequency of micturition: Secondary | ICD-10-CM

## 2020-06-16 DIAGNOSIS — E1165 Type 2 diabetes mellitus with hyperglycemia: Secondary | ICD-10-CM | POA: Diagnosis not present

## 2020-06-16 DIAGNOSIS — N401 Enlarged prostate with lower urinary tract symptoms: Secondary | ICD-10-CM | POA: Diagnosis not present

## 2020-06-16 DIAGNOSIS — N400 Enlarged prostate without lower urinary tract symptoms: Secondary | ICD-10-CM

## 2020-06-16 MED ORDER — TAMSULOSIN HCL 0.4 MG PO CAPS: 0.8000 mg | ORAL_CAPSULE | Freq: Every day | ORAL | 1 refills | Status: DC

## 2020-06-16 MED ORDER — INSULIN DETEMIR 100 UNIT/ML ~~LOC~~ SOLN
42.0000 [IU] | Freq: Every day | SUBCUTANEOUS | 3 refills | Status: DC
Start: 2020-06-16 — End: 2020-06-18

## 2020-06-16 NOTE — Progress Notes (Signed)
[facility] [place], [place] [address]  Internal MEDICINE  Office Visit Note  Patient Name: Michael Gross  [id number]  [id number]  Date of Service: 06/16/2020  Chief Complaint  Patient presents with  . Follow-up    Discuss meds and PA  . Diabetes  . Hyperlipidemia    HPI Patient is here for routine follow-up Close follow-up since starting on Novolog 5 units 3 times daily with each meal Has been monitoring his glucose readings and averaging 220-250 fasting Insurance company has also contacted him and they are no longer providing coverage for Lantus--requesting he switch to Levemir He would like to stop Novolog as he has not noticed any significant improvement Continues to complain of increased urinary frslequency--sleep interrupted about every 3-4 hours due to waking up to urinate  Current Medication: Outpatient Encounter Medications as of 06/16/2020  Medication Sig  . insulin detemir (LEVEMIR) 100 UNIT/ML injection Inject 0.42 mLs (42 Units total) into the skin at bedtime.  . Ascorbic Acid (VITAMIN C) 100 MG tablet Take 100 mg by mouth daily.  Marland Kitchen BAYER MICROLET LANCETS lancets Use as instructed to check blood sugar 2 times per day dx code E11.65  . cyclobenzaprine (FLEXERIL) 5 MG tablet Take 1 tablet (5 mg total) by mouth at bedtime.  Marland Kitchen glimepiride (AMARYL) 4 MG tablet TAKE 1 TABLET(4 MG) BY MOUTH TWICE DAILY  . glucose blood (BAYER CONTOUR NEXT TEST) test strip Use as instructed to check blood sugar 3 times per day at various different times. dx code E11.65 Bring meter to every visit.  . Insulin Pen Needle (BD PEN NEEDLE NANO 2ND GEN) 32G X 4 MM MISC Use as directed once a daily ELL.65  . Lancets 30G MISC Use to check blood sugars 5 times per day  . rosuvastatin (CRESTOR) 5 MG tablet Take 1 tablet (5 mg total) by mouth daily.  . tamsulosin (FLOMAX) 0.4 MG CAPS capsule Take 2 capsules (0.8 mg total) by mouth daily.  Marland Kitchen zinc gluconate 50 MG tablet Take  50 mg by mouth daily.  . [DISCONTINUED] canagliflozin (INVOKANA) 300 MG TABS tablet Take 1 tablet (300 mg total) by mouth daily. (Patient not taking: Reported on 06/16/2020)  . [DISCONTINUED] cholecalciferol (VITAMIN D3) 25 MCG (1000 UNIT) tablet Take 1,000 Units by mouth daily.  . [DISCONTINUED] finasteride (PROSCAR) 5 MG tablet Take 1 tablet (5 mg total) by mouth daily.  . [DISCONTINUED] insulin glargine (LANTUS SOLOSTAR) 100 UNIT/ML Solostar Pen Inject 36 Units into the skin daily.  . [DISCONTINUED] tamsulosin (FLOMAX) 0.4 MG CAPS capsule Take 2 capsules (0.8 mg total) by mouth daily. (Patient not taking: Reported on 06/16/2020)   No facility-administered encounter medications on file as of 06/16/2020.    Surgical History: Past Surgical History:  Procedure Laterality Date  . HERNIA REPAIR     2003  . KNEE SURGERY     1998  . MOHS SURGERY     2003    Medical History: Past Medical History:  Diagnosis Date  . Basal cell carcinoma   . BPH (benign prostatic hyperplasia)   . Diabetes ([place])   . Hyperlipidemia     Family History: Family History  Problem Relation Age of Onset  . Heart disease Father   . Prostate cancer Neg Hx   . Kidney cancer Neg Hx   . Bladder Cancer Neg Hx     Social History   Socioeconomic History  . Marital status: Married    Spouse name: Not on file  .  Number of children: Not on file  . Years of education: Not on file  . Highest education level: Not on file  Occupational History  . Not on file  Tobacco Use  . Smoking status: Never Smoker  . Smokeless tobacco: Never Used  Vaping Use  . Vaping Use: Never used  Substance and Sexual Activity  . Alcohol use: Yes    Comment: rare  . Drug use: No  . Sexual activity: Not on file  Other Topics Concern  . Not on file  Social History Narrative  . Not on file   Social Determinants of Health   Financial Resource Strain: Not on file  Food Insecurity: Not on file  Transportation Needs: Not on file   Physical Activity: Not on file  Stress: Not on file  Social Connections: Not on file  Intimate Partner Violence: Not on file   Review of Systems  Constitutional: Negative for chills, fatigue and unexpected weight change.  HENT: Negative for congestion, postnasal drip, rhinorrhea, sneezing and sore throat.   Eyes: Negative for redness.  Respiratory: Negative for cough, chest tightness and shortness of breath.   Cardiovascular: Negative for chest pain and palpitations.  Gastrointestinal: Negative for abdominal pain, constipation, diarrhea, nausea and vomiting.  Genitourinary: Positive for frequency. Negative for dysuria.  Musculoskeletal: Negative for arthralgias, back pain, joint swelling and neck pain.  Skin: Negative for rash.  Neurological: Negative for tremors and numbness.  Hematological: Negative for adenopathy. Does not bruise/bleed easily.  Psychiatric/Behavioral: Negative for behavioral problems (Depression), sleep disturbance and suicidal ideas. The patient is not nervous/anxious.     Vital Signs: BP 132/82   Pulse 82   Temp (!) 97.4 F (36.3 C)   Resp 16   Ht 6\' 3"  (1.905 m)   Wt 217 lb 6.4 oz (98.6 kg)   SpO2 97%   BMI 27.17 kg/m    Physical Exam Vitals reviewed.  Constitutional:      Appearance: Normal appearance. He is normal weight.  Cardiovascular:     Rate and Rhythm: Normal rate and regular rhythm.     Pulses: Normal pulses.     Heart sounds: Normal heart sounds.  Pulmonary:     Effort: Pulmonary effort is normal.     Breath sounds: Normal breath sounds.  Abdominal:     General: Abdomen is flat.     Palpations: Abdomen is soft.  Musculoskeletal:        General: Normal range of motion.     Cervical back: Normal range of motion.  Skin:    General: Skin is warm.     Comments: Scattered atypical appearing nevi  Neurological:     General: No focal deficit present.     Mental Status: He is alert and oriented to person, place, and time. Mental status  is at baseline.  Psychiatric:        Mood and Affect: Mood normal.        Behavior: Behavior normal.        Thought Content: Thought content normal.        Judgment: Judgment normal.    Assessment/Plan: 1. Uncontrolled type 2 diabetes mellitus with hyperglycemia (HCC) Switch Lantus to Levemir--increase daily dose to 42 units Restart glimepiride 4 mg BID  2. Benign prostatic hyperplasia with urinary frequency Restart Flomax--possible related to Invokana--recently discontinued If symptoms continue or worsen consider referral to urology - tamsulosin (FLOMAX) 0.4 MG CAPS capsule; Take 2 capsules (0.8 mg total) by mouth daily.  Dispense: 180  capsule; Refill: 1  3. Atypical mole Referral to dermatology for further evaluation of multiple atypical appearing moles - Ambulatory referral to Dermatology  General Counseling: jedd schulenburg understanding of the findings of todays visit and agrees with plan of treatment. I have discussed any further diagnostic evaluation that may be needed or ordered today. We also reviewed his medications today. he has been encouraged to call the office with any questions or concerns that should arise related to todays visit.    Orders Placed This Encounter  Procedures  . Ambulatory referral to Dermatology    Meds ordered this encounter  Medications  . tamsulosin (FLOMAX) 0.4 MG CAPS capsule    Sig: Take 2 capsules (0.8 mg total) by mouth daily.    Dispense:  180 capsule    Refill:  1  . insulin detemir (LEVEMIR) 100 UNIT/ML injection    Sig: Inject 0.42 mLs (42 Units total) into the skin at bedtime.    Dispense:  10 mL    Refill:  3    Time spent: 30 Minutes Time spent includes review of chart, medications, test results and follow-up plan with the patient.  This patient was seen by Theodoro Grist AGNP-C in Collaboration with Dr Lavera Guise as a part of collaborative care agreement     Tanna Furry. Nygeria Lager AGNP-C Internal medicine

## 2020-06-18 ENCOUNTER — Other Ambulatory Visit: Payer: Self-pay

## 2020-06-18 MED ORDER — LEVEMIR FLEXTOUCH 100 UNIT/ML ~~LOC~~ SOPN: 42.0000 [IU] | PEN_INJECTOR | Freq: Every day | SUBCUTANEOUS | 3 refills | Status: DC

## 2020-06-18 NOTE — Telephone Encounter (Signed)
Change levemir to flextouch pen

## 2020-06-22 ENCOUNTER — Encounter: Payer: Self-pay | Admitting: Hospice and Palliative Medicine

## 2020-07-05 ENCOUNTER — Ambulatory Visit (INDEPENDENT_AMBULATORY_CARE_PROVIDER_SITE_OTHER): Payer: BC Managed Care – PPO | Admitting: Hospice and Palliative Medicine

## 2020-07-05 ENCOUNTER — Encounter: Payer: Self-pay | Admitting: Hospice and Palliative Medicine

## 2020-07-05 ENCOUNTER — Other Ambulatory Visit: Payer: Self-pay

## 2020-07-05 VITALS — BP 130/84 | HR 95 | Temp 97.8°F | Resp 16 | Ht 75.0 in | Wt 210.8 lb

## 2020-07-05 DIAGNOSIS — N401 Enlarged prostate with lower urinary tract symptoms: Secondary | ICD-10-CM | POA: Diagnosis not present

## 2020-07-05 DIAGNOSIS — R350 Frequency of micturition: Secondary | ICD-10-CM

## 2020-07-05 DIAGNOSIS — E782 Mixed hyperlipidemia: Secondary | ICD-10-CM

## 2020-07-05 DIAGNOSIS — E1165 Type 2 diabetes mellitus with hyperglycemia: Secondary | ICD-10-CM

## 2020-07-05 LAB — POCT GLYCOSYLATED HEMOGLOBIN (HGB A1C): Hemoglobin A1C: 9.1 % — AB (ref 4.0–5.6)

## 2020-07-05 MED ORDER — TAMSULOSIN HCL 0.4 MG PO CAPS: 0.4000 mg | ORAL_CAPSULE | Freq: Every day | ORAL | 3 refills | Status: DC

## 2020-07-05 MED ORDER — TRULICITY 0.75 MG/0.5ML ~~LOC~~ SOAJ: 0.7500 mg | SUBCUTANEOUS | 3 refills | Status: DC

## 2020-07-05 NOTE — Progress Notes (Signed)
[facility] [place], [place] [address]  Internal MEDICINE  Office Visit Note  Patient Name: Michael Gross  [id number]  [id number]  Date of Service: 07/06/2020  Chief Complaint  Patient presents with  . Follow-up  . Diabetes  . Hyperlipidemia    HPI Patient is here for routine follow-up Close follow-up for glucose monitoring Currently taking 42 units of Levemir, Invokana as well as glimepiride BID Invokana is no longer covered and will not be able to afford out of pocket cost  Started on Tamsulosin at last visit for urinary frequency, difficulty initiating stream and dribbling Unsure if he was able to pick this up from his pharmacy Continues to have urinary symptoms  Need further clarification on his colonoscopy/Cologuard--no documentation of either in his chart  Did get an appointment with dermatology but not until later this summer  Current Medication: Outpatient Encounter Medications as of 07/05/2020  Medication Sig  . Ascorbic Acid (VITAMIN C) 100 MG tablet Take 100 mg by mouth daily.  Marland Kitchen BAYER MICROLET LANCETS lancets Use as instructed to check blood sugar 2 times per day dx code E11.65  . cyclobenzaprine (FLEXERIL) 5 MG tablet Take 1 tablet (5 mg total) by mouth at bedtime.  . Dulaglutide (TRULICITY) 4.31 VQ/0.0QQ SOPN Inject 0.75 mg into the skin once a week.  Marland Kitchen glimepiride (AMARYL) 4 MG tablet TAKE 1 TABLET(4 MG) BY MOUTH TWICE DAILY  . glucose blood (BAYER CONTOUR NEXT TEST) test strip Use as instructed to check blood sugar 3 times per day at various different times. dx code E11.65 Bring meter to every visit.  Marland Kitchen insulin detemir (LEVEMIR FLEXTOUCH) 100 UNIT/ML FlexPen Inject 42 Units into the skin daily.  . Insulin Pen Needle (BD PEN NEEDLE NANO 2ND GEN) 32G X 4 MM MISC Use as directed once a daily ELL.65  . Lancets 30G MISC Use to check blood sugars 5 times per day  . rosuvastatin (CRESTOR) 5 MG tablet Take 1 tablet (5 mg total) by mouth  daily.  . tamsulosin (FLOMAX) 0.4 MG CAPS capsule Take 1 capsule (0.4 mg total) by mouth daily.  Marland Kitchen zinc gluconate 50 MG tablet Take 50 mg by mouth daily.  . [DISCONTINUED] tamsulosin (FLOMAX) 0.4 MG CAPS capsule Take 2 capsules (0.8 mg total) by mouth daily.   No facility-administered encounter medications on file as of 07/05/2020.    Surgical History: Past Surgical History:  Procedure Laterality Date  . HERNIA REPAIR     2003  . KNEE SURGERY     1998  . MOHS SURGERY     2003    Medical History: Past Medical History:  Diagnosis Date  . Basal cell carcinoma   . BPH (benign prostatic hyperplasia)   . Diabetes ([place])   . Hyperlipidemia     Family History: Family History  Problem Relation Age of Onset  . Heart disease Father   . Prostate cancer Neg Hx   . Kidney cancer Neg Hx   . Bladder Cancer Neg Hx     Social History   Socioeconomic History  . Marital status: Married    Spouse name: Not on file  . Number of children: Not on file  . Years of education: Not on file  . Highest education level: Not on file  Occupational History  . Not on file  Tobacco Use  . Smoking status: Never Smoker  . Smokeless tobacco: Never Used  Vaping Use  . Vaping Use: Never used  Substance and Sexual  Activity  . Alcohol use: Yes    Comment: rare  . Drug use: No  . Sexual activity: Not on file  Other Topics Concern  . Not on file  Social History Narrative  . Not on file   Social Determinants of Health   Financial Resource Strain: Not on file  Food Insecurity: Not on file  Transportation Needs: Not on file  Physical Activity: Not on file  Stress: Not on file  Social Connections: Not on file  Intimate Partner Violence: Not on file      Review of Systems  Constitutional: Negative for chills, fatigue and unexpected weight change.  HENT: Negative for congestion, postnasal drip, rhinorrhea, sneezing and sore throat.   Eyes: Negative for redness.  Respiratory: Negative for  cough, chest tightness and shortness of breath.   Cardiovascular: Negative for chest pain and palpitations.  Gastrointestinal: Negative for abdominal pain, constipation, diarrhea, nausea and vomiting.  Genitourinary: Negative for dysuria and frequency.  Musculoskeletal: Negative for arthralgias, back pain, joint swelling and neck pain.  Skin: Negative for rash.  Neurological: Negative for tremors and numbness.  Hematological: Negative for adenopathy. Does not bruise/bleed easily.  Psychiatric/Behavioral: Negative for behavioral problems (Depression), sleep disturbance and suicidal ideas. The patient is not nervous/anxious.     Vital Signs: BP 130/84   Pulse 95   Temp 97.8 F (36.6 C)   Resp 16   Ht 6\' 3"  (1.905 m)   Wt 210 lb 12.8 oz (95.6 kg)   SpO2 98%   BMI 26.35 kg/m    Physical Exam Vitals reviewed.  Constitutional:      Appearance: Normal appearance. He is normal weight.  Cardiovascular:     Rate and Rhythm: Normal rate and regular rhythm.     Pulses: Normal pulses.     Heart sounds: Normal heart sounds.  Pulmonary:     Effort: Pulmonary effort is normal.     Breath sounds: Normal breath sounds.  Abdominal:     General: Abdomen is flat.     Palpations: Abdomen is soft.  Musculoskeletal:        General: Normal range of motion.     Cervical back: Normal range of motion.  Skin:    General: Skin is warm.  Neurological:     General: No focal deficit present.     Mental Status: He is alert and oriented to person, place, and time. Mental status is at baseline.  Psychiatric:        Mood and Affect: Mood normal.        Behavior: Behavior normal.        Thought Content: Thought content normal.        Judgment: Judgment normal.    Assessment/Plan: 1. 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  Dispense: 2 mL; Refill: 3  2. Benign prostatic hyperplasia with urinary frequency Resent Flomax to help with urinary symptoms Symptoms should also improve with discontinuing Invokana May need referral to urology - tamsulosin (FLOMAX) 0.4 MG CAPS capsule; Take 1 capsule (0.4 mg total) by mouth daily.  Dispense: 30 capsule; Refill: 3  3. Mixed hyperlipidemia Continue with statin and asa Will need further vascular studies  General Counseling: kelon easom understanding of the findings of todays visit and agrees with plan of treatment. I have discussed any further diagnostic evaluation that may be needed or ordered today. We also reviewed his medications today. he has been encouraged to call the office with any questions or concerns that should arise related to todays visit.    Orders Placed This Encounter  Procedures  . POCT glycosylated hemoglobin (Hb A1C)    Meds ordered this encounter  Medications  . tamsulosin (FLOMAX) 0.4 MG CAPS capsule    Sig: Take 1 capsule (0.4 mg total) by mouth daily.    Dispense:  30 capsule    Refill:  3  . Dulaglutide (TRULICITY) 3.75 GW/3.7KC SOPN    Sig: Inject 0.75 mg into the skin once a week.    Dispense:  2 mL    Refill:  3    Time spent: 30 Minutes Time spent includes review of chart, medications, test results and follow-up plan with the patient.  This patient was seen by Theodoro Grist AGNP-C in Collaboration with Dr Lavera Guise as a part of collaborative care agreement     Tanna Furry. Helga Asbury AGNP-C Internal medicine

## 2020-07-06 ENCOUNTER — Encounter: Payer: Self-pay | Admitting: Hospice and Palliative Medicine

## 2020-08-05 LAB — COLOGUARD

## 2020-08-29 ENCOUNTER — Other Ambulatory Visit: Payer: Self-pay | Admitting: Adult Health

## 2020-09-08 ENCOUNTER — Ambulatory Visit
Admission: RE | Admit: 2020-09-08 | Discharge: 2020-09-08 | Disposition: A | Discharge status: Home / Self Care | Payer: BC Managed Care – PPO | Source: Ambulatory Visit | Attending: Hospice and Palliative Medicine | Admitting: Hospice and Palliative Medicine

## 2020-09-08 ENCOUNTER — Ambulatory Visit
Admission: RE | Admit: 2020-09-08 | Discharge: 2020-09-08 | Disposition: A | Discharge status: Home / Self Care | Payer: BC Managed Care – PPO | Attending: Hospice and Palliative Medicine | Admitting: Hospice and Palliative Medicine

## 2020-09-08 ENCOUNTER — Ambulatory Visit: Payer: BC Managed Care – PPO | Admitting: Hospice and Palliative Medicine

## 2020-09-08 ENCOUNTER — Other Ambulatory Visit: Payer: Self-pay

## 2020-09-08 ENCOUNTER — Encounter: Payer: Self-pay | Admitting: Hospice and Palliative Medicine

## 2020-09-08 VITALS — BP 121/76 | HR 80 | Temp 98.7°F | Resp 16 | Ht 75.0 in | Wt 213.0 lb

## 2020-09-08 DIAGNOSIS — I10 Essential (primary) hypertension: Secondary | ICD-10-CM

## 2020-09-08 DIAGNOSIS — M79671 Pain in right foot: Secondary | ICD-10-CM

## 2020-09-08 DIAGNOSIS — M25571 Pain in right ankle and joints of right foot: Secondary | ICD-10-CM | POA: Diagnosis not present

## 2020-09-08 MED ORDER — GABAPENTIN 100 MG PO CAPS: 100.0000 mg | ORAL_CAPSULE | Freq: Every day | ORAL | 0 refills | Status: DC

## 2020-09-08 MED ORDER — MELOXICAM 7.5 MG PO TABS: 7.5000 mg | ORAL_TABLET | Freq: Every day | ORAL | 0 refills | Status: DC

## 2020-09-08 NOTE — Progress Notes (Signed)
[facility] [place], [place] [address]  Internal MEDICINE  Office Visit Note  Patient Name: Michael Gross  [id number]  [id number]  Date of Service: 09/09/2020  Chief Complaint  Patient presents with  . Diabetes  . Cancer  . Hyperlipidemia  . Acute Visit    Severe pain in right foot and numbness     HPI Pt is here for a sick visit. 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 Ascorbic Acid (VITAMIN C) 100 MG tablet Take 100 mg by mouth daily.  Marland Kitchen BAYER MICROLET LANCETS lancets Use as instructed to check blood sugar 2 times per day dx code E11.65  . cyclobenzaprine (FLEXERIL) 5 MG tablet Take 1 tablet (5 mg total) by mouth at bedtime.  . Dulaglutide (TRULICITY) 5.36 UY/4.0HK SOPN Inject 0.75 mg into the skin once a week.  . gabapentin (NEURONTIN) 100 MG capsule Take 1 capsule (100 mg total) by mouth at bedtime.  Marland Kitchen glimepiride (AMARYL) 4 MG tablet TAKE 1 TABLET(4 MG) BY MOUTH TWICE DAILY  . glucose blood (BAYER CONTOUR NEXT TEST) test strip Use as instructed to check blood sugar 3 times per day at various different times. dx code E11.65 Bring meter to every visit.  Marland Kitchen insulin detemir (LEVEMIR FLEXTOUCH) 100 UNIT/ML FlexPen Inject 42 Units into the skin daily.  . Insulin Pen Needle (BD PEN NEEDLE NANO 2ND GEN) 32G X 4 MM MISC Use as directed once a daily ELL.65  . Lancets 30G MISC Use to check blood sugars 5 times per day  . meloxicam (MOBIC) 7.5 MG tablet Take 1 tablet (7.5 mg total) by mouth daily.  . rosuvastatin (CRESTOR) 5 MG tablet Take 1 tablet (5 mg  total) by mouth daily.  . tamsulosin (FLOMAX) 0.4 MG CAPS capsule Take 1 capsule (0.4 mg total) by mouth daily.  Marland Kitchen zinc gluconate 50 MG tablet Take 50 mg by mouth daily.   No facility-administered encounter medications on file as of 09/08/2020.      Medical History: Past Medical History:  Diagnosis Date  . Basal cell carcinoma   . BPH (benign prostatic hyperplasia)   . Diabetes ([place])   . Hyperlipidemia      Vital Signs: BP 121/76   Pulse 80   Temp 98.7 F (37.1 C)   Resp 16   Ht 6\' 3"  (1.905 m)   Wt 213 lb (96.6 kg)   SpO2 93%   BMI 26.62 kg/m    Review of Systems  Constitutional: Negative for chills, fatigue and unexpected weight change.  HENT: Negative for congestion, postnasal drip, rhinorrhea, sneezing and sore throat.   Eyes: Negative for redness.  Respiratory: Negative for cough, chest tightness and shortness of breath.   Cardiovascular: Negative for chest pain and palpitations.  Gastrointestinal: Negative for abdominal pain, constipation, diarrhea, nausea and vomiting.  Genitourinary: Negative for dysuria and frequency.  Musculoskeletal: Negative for arthralgias, back pain, joint swelling and neck pain.  Skin: Negative for rash.  Neurological: Negative for tremors and numbness.  Hematological: Negative for adenopathy. Does not bruise/bleed easily.  Psychiatric/Behavioral: Negative for  behavioral problems (Depression), sleep disturbance and suicidal ideas. The patient is not nervous/anxious.     Physical Exam Vitals reviewed.  Constitutional:      Appearance: Normal appearance. He is normal weight.  Cardiovascular:     Rate and Rhythm: Normal rate and regular rhythm.     Pulses: Normal pulses.     Heart sounds: Normal heart sounds.  Pulmonary:     Effort: Pulmonary effort is normal.     Breath sounds: Normal breath sounds.  Musculoskeletal:     Cervical back: Normal range of motion.     Comments: Right foot--no swelling or color change, appropriate  temperature, pulses 2+, no erythema  Neurological:     Mental Status: He is alert.    Assessment/Plan: 1. Right foot pain Differentials include gout, arthritis and/or neuropathy with presence of T2DM Will obtain imaging and uric acid level for appropriate diagnosis Start Meloxicam daily and gabapentin at night - DG Foot Complete Right; Future - gabapentin (NEURONTIN) 100 MG capsule; Take 1 capsule (100 mg total) by mouth at bedtime.  Dispense: 90 capsule; Refill: 0 - meloxicam (MOBIC) 7.5 MG tablet; Take 1 tablet (7.5 mg total) by mouth daily.  Dispense: 30 tablet; Refill: 0 - Uric acid  2. Essential hypertension BP and HR remain well controlled, continue to monitor  General Counseling: johnatan baskette understanding of the findings of todays visit and agrees with plan of treatment. I have discussed any further diagnostic evaluation that may be needed or ordered today. We also reviewed his medications today. he has been encouraged to call the office with any questions or concerns that should arise related to todays visit.   Orders Placed This Encounter  Procedures  . DG Foot Complete Right  . Uric acid    Meds ordered this encounter  Medications  . gabapentin (NEURONTIN) 100 MG capsule    Sig: Take 1 capsule (100 mg total) by mouth at bedtime.    Dispense:  90 capsule    Refill:  0  . meloxicam (MOBIC) 7.5 MG tablet    Sig: Take 1 tablet (7.5 mg total) by mouth daily.    Dispense:  30 tablet    Refill:  0    Time spent: 25 Minutes Time spent includes review of chart, medications, test results and follow-up plan with the patient.  This patient was seen by Theodoro Grist AGNP-C in Collaboration with Dr Lavera Guise as a part of collaborative care agreement.  Tanna Furry [facility] Internal Medicine

## 2020-09-09 ENCOUNTER — Encounter: Payer: Self-pay | Admitting: Hospice and Palliative Medicine

## 2020-09-09 DIAGNOSIS — M79671 Pain in right foot: Secondary | ICD-10-CM | POA: Diagnosis not present

## 2020-09-10 LAB — URIC ACID: Uric Acid: 3.8 mg/dL (ref 3.8–8.4)

## 2020-09-15 ENCOUNTER — Telehealth: Payer: Self-pay

## 2020-09-16 ENCOUNTER — Telehealth: Payer: Self-pay

## 2020-09-16 ENCOUNTER — Other Ambulatory Visit: Payer: Self-pay

## 2020-09-16 ENCOUNTER — Encounter: Payer: Self-pay | Admitting: Nurse Practitioner

## 2020-09-16 ENCOUNTER — Ambulatory Visit: Payer: BC Managed Care – PPO | Admitting: Nurse Practitioner

## 2020-09-16 DIAGNOSIS — R9430 Abnormal result of cardiovascular function study, unspecified: Secondary | ICD-10-CM

## 2020-09-16 DIAGNOSIS — R6889 Other general symptoms and signs: Secondary | ICD-10-CM | POA: Diagnosis not present

## 2020-09-16 DIAGNOSIS — M79604 Pain in right leg: Secondary | ICD-10-CM

## 2020-09-16 MED ORDER — TRAMADOL HCL 50 MG PO TABS: 50.0000 mg | ORAL_TABLET | Freq: Three times a day (TID) | ORAL | 0 refills | Status: AC | PRN

## 2020-09-16 NOTE — Telephone Encounter (Signed)
Spoke to pt and informed him of cancelled consult to vascular and that provider wants to do one other test (arterial dopplers) before being sent to vascular.  Advised pt that they will contact him about insurance and co-pays before appt scheduled

## 2020-09-16 NOTE — Telephone Encounter (Signed)
-----   Message from Jonetta Osgood, NP sent at 09/16/2020  4:00 PM EDT ----- Marykay Lex guys, I do not want to send for vascular referral yet, cancelled the consult and added arterial dopplers to be done here at [facility] and please let the patient know that he needs one more test before going to vascular and if he continues to have pain to please let us know. Make sure he has a follow up scheduled with me after the dopplers are done.

## 2020-09-16 NOTE — Telephone Encounter (Signed)
Lmom to call us pt need appt

## 2020-09-16 NOTE — Progress Notes (Signed)
[facility] [place], [place] [address]  Internal MEDICINE  Office Visit Note  Patient Name: Michael Gross  [id number]  [id number]  Date of Service: 09/25/2020  Chief Complaint  Patient presents with  . 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 Diabetes  . Hyperlipidemia    HPI Pt is here for a sick visit. Complaining of right foot pain-initially started over a month ago but has increased in intensity over the last few days. 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 [EXPIRED] traMADol (ULTRAM) 50 MG tablet Take 1 tablet (50 mg total) by mouth every 8 (eight) hours as needed for up to 5 days.  . Ascorbic Acid (VITAMIN C) 100 MG tablet Take 100 mg by mouth daily.  Marland Kitchen BAYER MICROLET LANCETS lancets Use as instructed to check blood sugar 2 times per day dx code E11.65  . cyclobenzaprine (FLEXERIL) 5 MG tablet Take 1 tablet (5 mg total) by mouth at bedtime.  . Dulaglutide (TRULICITY) 3.29 JM/4.2AS SOPN Inject 0.75 mg into the skin once a week.  . gabapentin (NEURONTIN) 100 MG capsule Take 1 capsule (100 mg total) by mouth at bedtime.  Marland Kitchen glimepiride (AMARYL) 4 MG tablet TAKE 1 TABLET(4 MG) BY MOUTH TWICE DAILY  . glucose blood (BAYER CONTOUR NEXT TEST) test strip Use as instructed to check blood sugar 3 times per day at various different times. dx code E11.65 Bring meter to every visit.  Marland Kitchen insulin detemir (LEVEMIR FLEXTOUCH) 100 UNIT/ML FlexPen Inject 42 Units into the skin daily.  . Insulin Pen Needle (BD PEN NEEDLE NANO  2ND GEN) 32G X 4 MM MISC Use as directed once a daily ELL.65  . Lancets 30G MISC Use to check blood sugars 5 times per day  . meloxicam (MOBIC) 7.5 MG tablet Take 1 tablet (7.5 mg total) by mouth daily.  . rosuvastatin (CRESTOR) 5 MG tablet Take 1 tablet (5 mg total) by mouth daily.  . tamsulosin (FLOMAX) 0.4 MG CAPS capsule Take 1 capsule (0.4 mg total) by mouth daily.  Marland Kitchen zinc gluconate 50 MG tablet Take 50 mg by mouth daily.   No facility-administered encounter medications on file as of 09/16/2020.    Surgical History: Past Surgical History:  Procedure Laterality Date  . HERNIA REPAIR     2003  . KNEE SURGERY     1998  . MOHS SURGERY     2003    Medical History: Past Medical History:  Diagnosis Date  . Basal cell carcinoma   . BPH (benign prostatic hyperplasia)   . Diabetes ([place])   . Hyperlipidemia     Family History: Family History  Problem Relation Age of Onset  . Heart disease Father   . Prostate cancer Neg Hx   . Kidney cancer Neg Hx   . Bladder Cancer Neg Hx     Social History   Socioeconomic History  . Marital status: Married    Spouse name: Not on file  . Number of  children: Not on file  . Years of education: Not on file  . Highest education level: Not on file  Occupational History  . Not on file  Tobacco Use  . Smoking status: Never Smoker  . Smokeless tobacco: Never Used  Vaping Use  . Vaping Use: Never used  Substance and Sexual Activity  . Alcohol use: Yes    Comment: rare  . Drug use: No  . Sexual activity: Not on file  Other Topics Concern  . Not on file  Social History Narrative  . Not on file   Social Determinants of Health   Financial Resource Strain: Not on file  Food Insecurity: Not on file  Transportation Needs: Not on file  Physical Activity: Not on file  Stress: Not on file  Social Connections: Not on file  Intimate Partner Violence: Not on file      Review of Systems  Constitutional: Negative.  Negative for fatigue  and fever.  HENT: Negative for congestion, mouth sores and postnasal drip.   Respiratory: Negative.  Negative for cough.   Cardiovascular: Negative.  Negative for chest pain.  Genitourinary: Negative for flank pain.  Musculoskeletal: Positive for arthralgias.       Left foot  Psychiatric/Behavioral: Negative.     Vital Signs: BP 129/70   Pulse 88   Temp 97.9 F (36.6 C)   Resp 16   Ht 6\' 3"  (1.905 m)   Wt 215 lb 12.8 oz (97.9 kg)   SpO2 98%   BMI 26.97 kg/m    Physical Exam Vitals and nursing note reviewed.  Constitutional:      Appearance: He is overweight.  HENT:     Head: Normocephalic and atraumatic.  Cardiovascular:     Rate and Rhythm: Normal rate and regular rhythm.     Pulses: Normal pulses.     Heart sounds: Normal heart sounds.  Pulmonary:     Effort: Pulmonary effort is normal.     Breath sounds: Normal breath sounds.  Feet:     Left foot:     Skin integrity: Skin integrity normal.     Toenail Condition: Left toenails are normal.  Neurological:     Mental Status: He is alert.        Assessment/Plan: 1. Pain of right lower extremity ABI screen: Pt was here was ABI Right Brachial 132 and Right ankle 152 The left Brachial 136 and left ankle 143 and final calculation Right leg 1.11 and Left Leg 1.05. Results are normal if asymptomatic. A vascular surgery consult is recommended if symptomatic with Conlan's results of 1.11 in the right and 1.05 in the left. Arterial lower extremity doppler ordered. Will consider vascular surgery consult depending on doppler results. 5-day supply of tramadol ordered to help with left foot pain.  Podiatry consult ordered due to history of diabetes and numbness in foot.  - POCT ABI Screening Pilot No Charge - Ambulatory referral to Podiatry - traMADol (ULTRAM) 50 MG tablet; Take 1 tablet (50 mg total) by mouth every 8 (eight) hours as needed for up to 5 days.  Dispense: 15 tablet; Refill: 0 - US ARTERIAL LOWER EXTREMITY  DUPLEX BILATERAL; Future  2. Abnormal ankle brachial index (ABI) Abnormal ABI with atypical lower extremity pain. Bilateral lower extremity arterial doppler ordered, will review results with patient at follow up visit. - POCT ABI Screening Pilot No Charge - US ARTERIAL LOWER EXTREMITY DUPLEX BILATERAL; Future   General Counseling: Michael Gross understanding of the findings of todays  visit and agrees with plan of treatment. I have discussed any further diagnostic evaluation that may be needed or ordered today. We also reviewed his medications today. he has been encouraged to call the office with any questions or concerns that should arise related to todays visit.    Orders Placed This Encounter  Procedures  . US ARTERIAL LOWER EXTREMITY DUPLEX BILATERAL  . Ambulatory referral to Podiatry  . POCT ABI Screening Pilot No Charge    Meds ordered this encounter  Medications  . traMADol (ULTRAM) 50 MG tablet    Sig: Take 1 tablet (50 mg total) by mouth every 8 (eight) hours as needed for up to 5 days.    Dispense:  15 tablet    Refill:  0   Return in about 1 month (around 10/17/2020) for F/U after ultrasound of BLE, Michael Gross PCP.  Total time spent: 30 Minutes Time spent includes review of chart, medications, test results, and follow up plan with the patient.   [place] Controlled Substance Database was reviewed by me.   Dr Lavera Guise Internal medicine

## 2020-09-17 IMAGING — CR DG CHEST 2V
1 series · 2 of 2 positions shown · non-contrast
Comparison: 04/10/2019

CLINICAL DATA: Foot chest pain

EXAM:
CHEST - 2 VIEW

[Series 1: dg chest 2 view · 0.14mm/px · 2 of 2 slices shown]
[im 1/2]
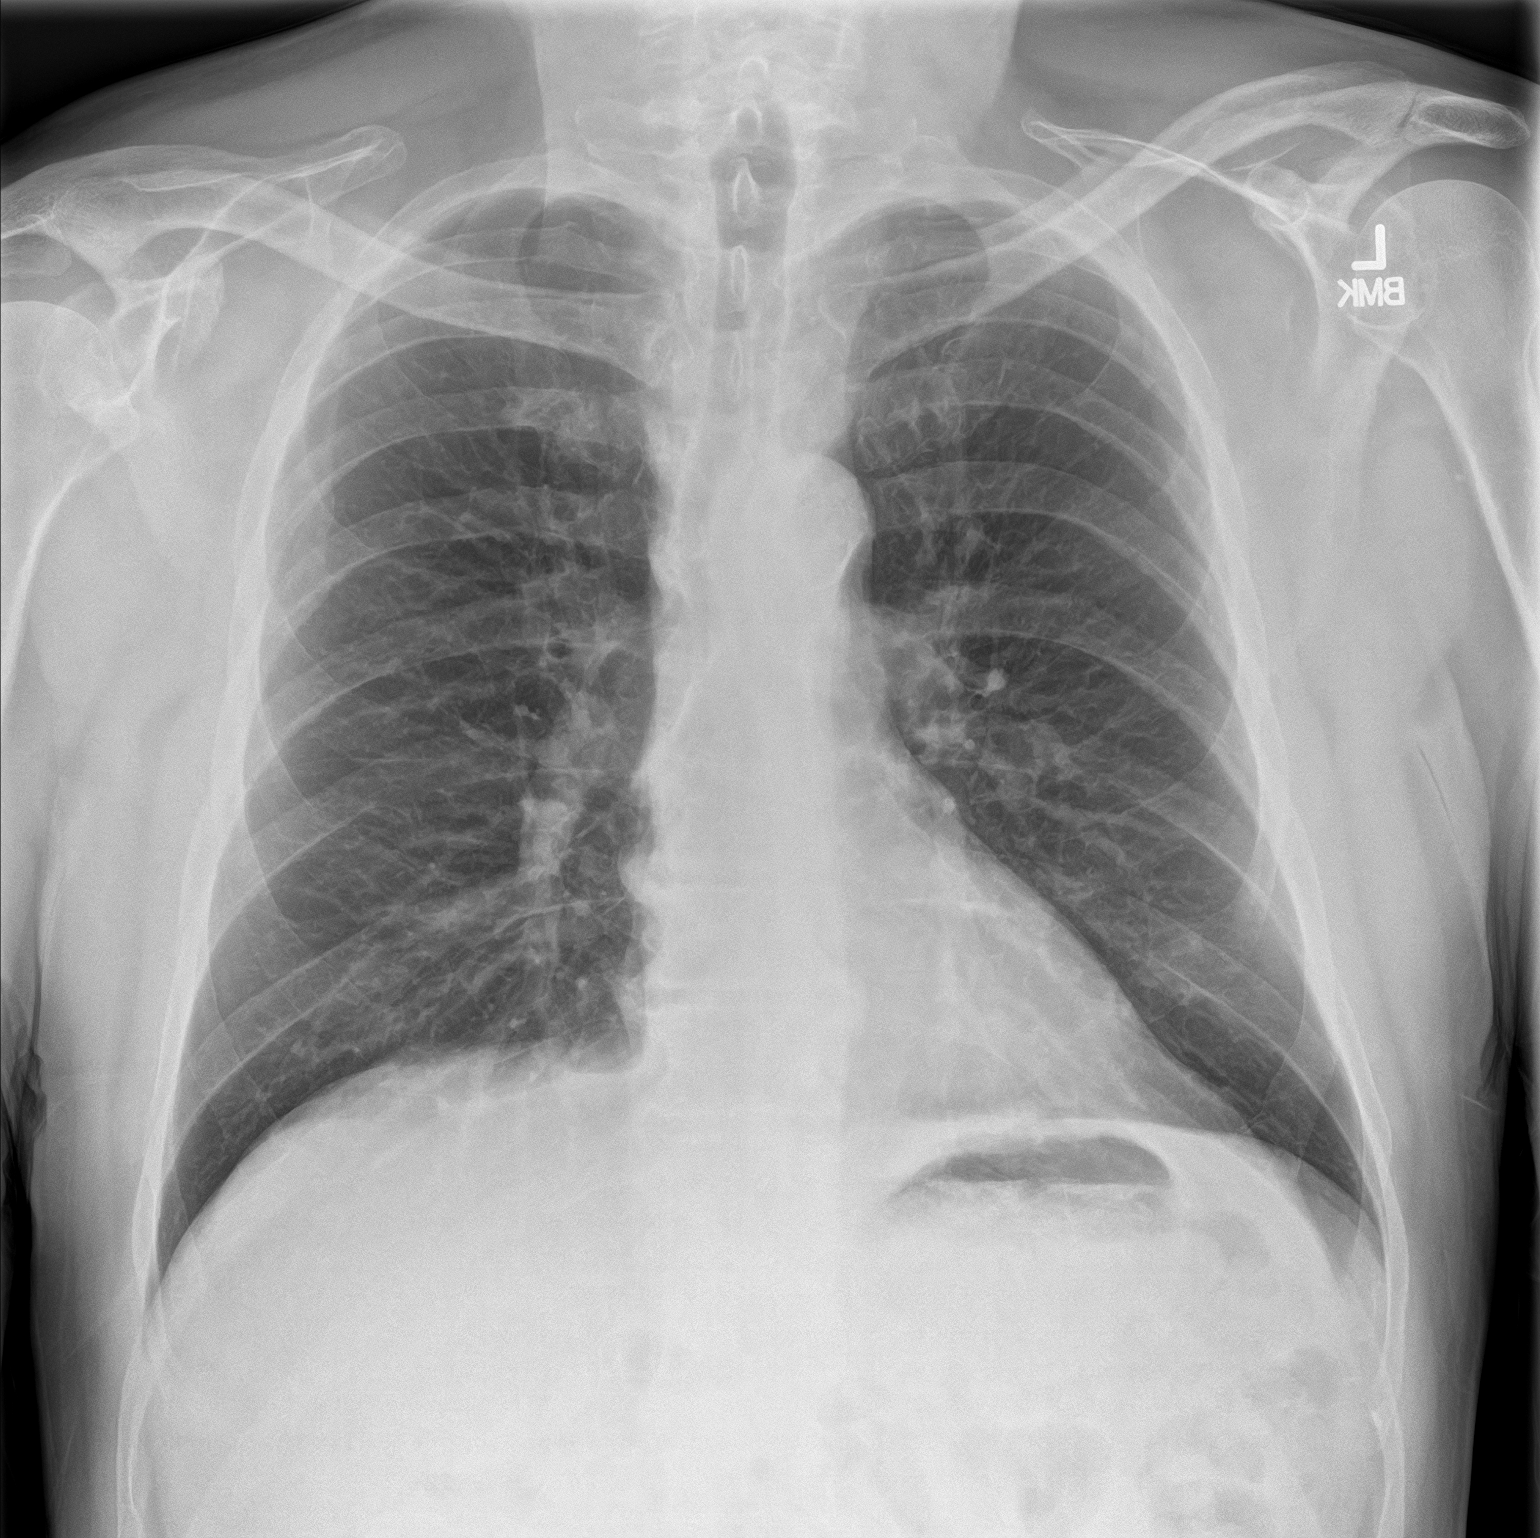
[im 2/2]
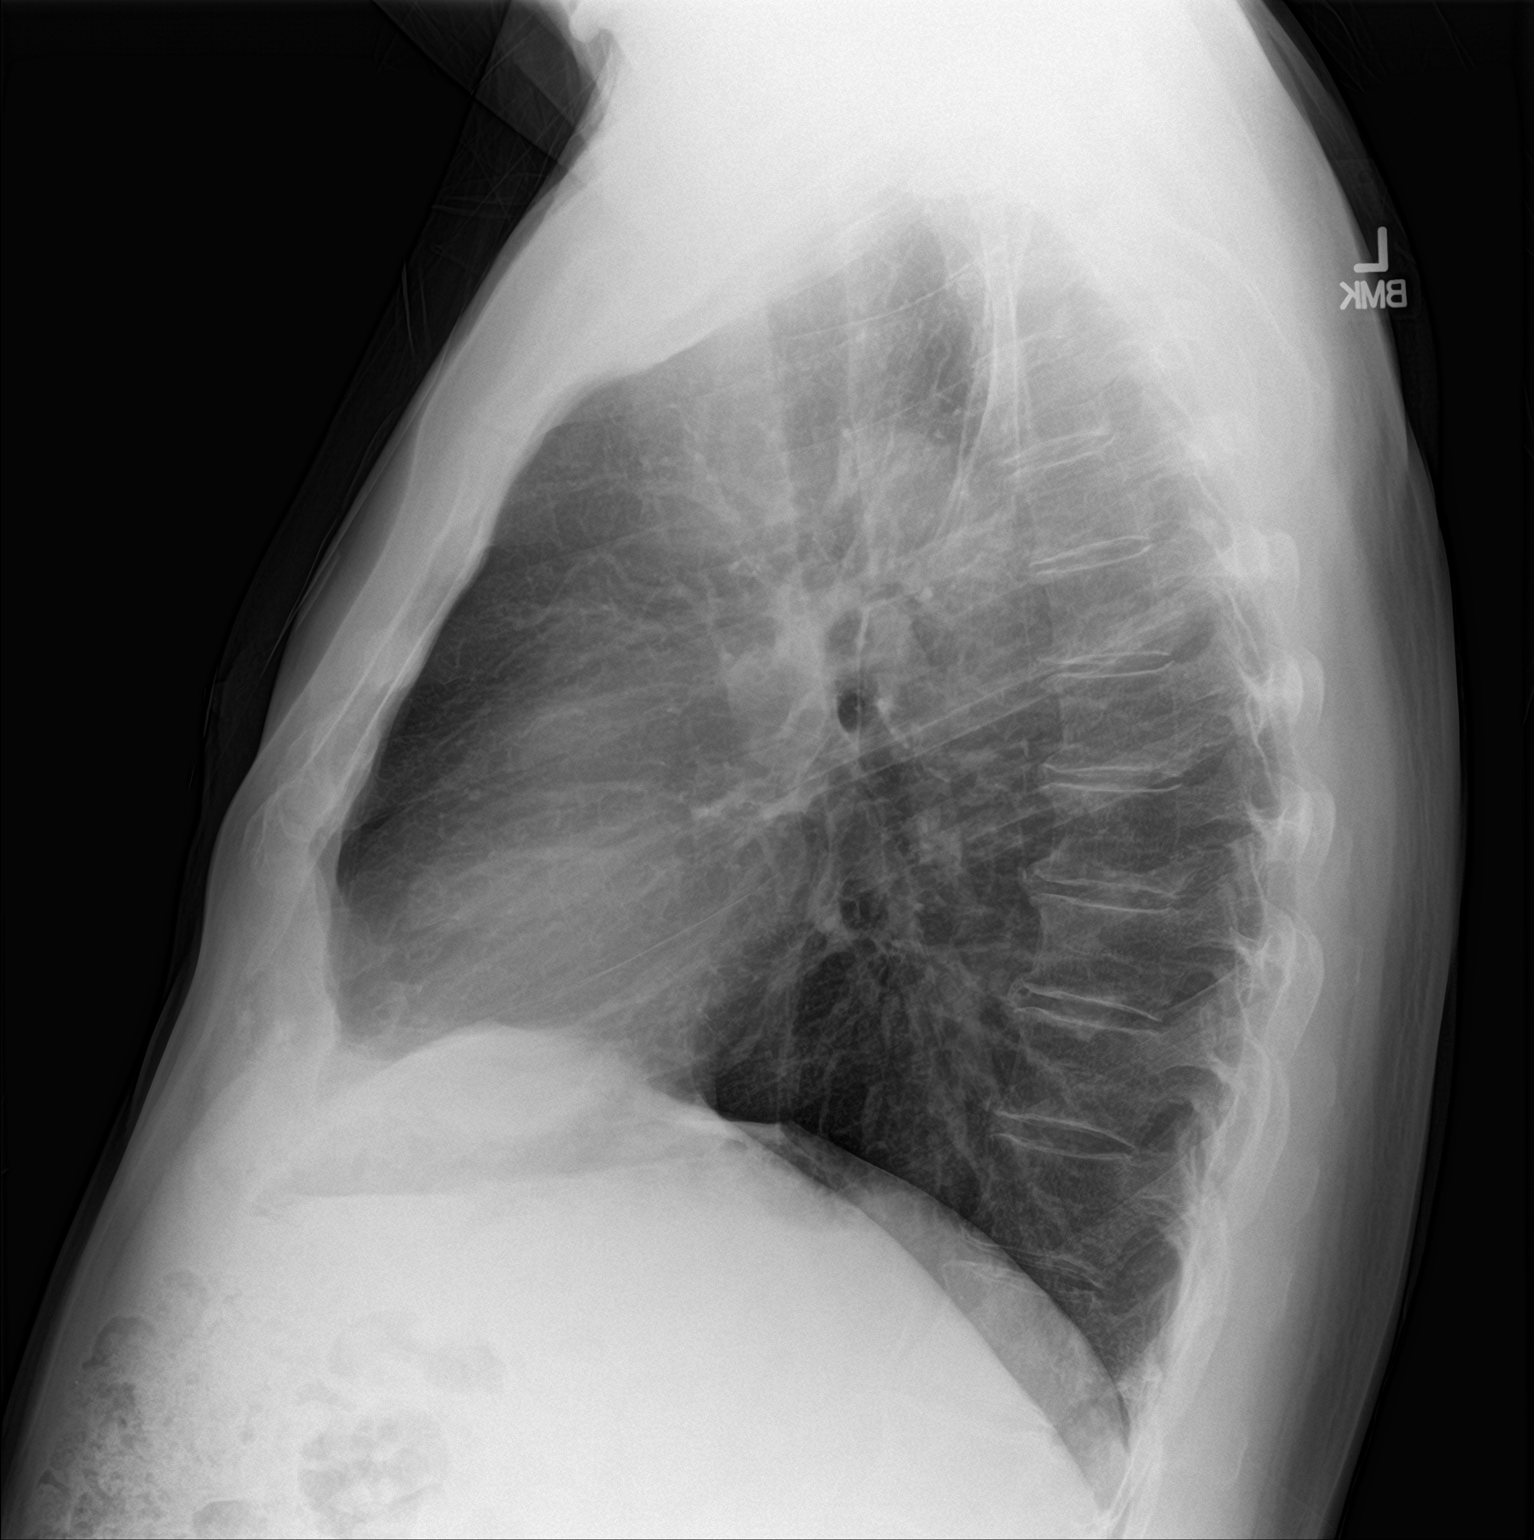

[2 of 2 positions shown; findings below may reference images not displayed]

FINDINGS: The heart size and mediastinal contours are within normal limits.
Both lungs are clear. The visualized skeletal structures are
unremarkable.
IMPRESSION: No active cardiopulmonary disease.

## 2020-09-17 NOTE — Telephone Encounter (Signed)
Pt had appt yesterday

## 2020-09-29 ENCOUNTER — Encounter: Payer: Self-pay | Admitting: Podiatry

## 2020-09-29 ENCOUNTER — Other Ambulatory Visit: Payer: Self-pay

## 2020-09-29 ENCOUNTER — Ambulatory Visit: Payer: BC Managed Care – PPO | Admitting: Podiatry

## 2020-09-29 DIAGNOSIS — S86211A Strain of muscle(s) and tendon(s) of anterior muscle group at lower leg level, right leg, initial encounter: Secondary | ICD-10-CM | POA: Diagnosis not present

## 2020-09-29 DIAGNOSIS — S86219A Strain of muscle(s) and tendon(s) of anterior muscle group at lower leg level, unspecified leg, initial encounter: Secondary | ICD-10-CM

## 2020-09-29 MED ORDER — CELECOXIB 100 MG PO CAPS: 100.0000 mg | ORAL_CAPSULE | Freq: Two times a day (BID) | ORAL | 3 refills | Status: DC

## 2020-09-29 NOTE — Progress Notes (Signed)
Subjective:  Patient ID: Michael Gross, male    DOB: 1956/04/02,  MRN: [id number] HPI Chief Complaint  Patient presents with  . 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 New Patient (Initial Visit)    65 y.o. male presents with the above complaint.   ROS: Denies fever chills nausea vomiting muscle aches pains calf pain back pain chest pain shortness of breath.    Past Medical History:  Diagnosis Date  . Basal cell carcinoma   . BPH (benign prostatic hyperplasia)   . Diabetes ([place])   . Hyperlipidemia    Past Surgical History:  Procedure Laterality Date  . HERNIA REPAIR     2003  . KNEE SURGERY     1998  . MOHS SURGERY     2003    Current Outpatient Medications:  .  celecoxib (CELEBREX) 100 MG capsule, Take 1 capsule (100 mg total) by mouth 2 (two) times daily., Disp: 60 capsule, Rfl: 3 .  Ascorbic Acid (VITAMIN C) 100 MG tablet, Take 100 mg by mouth daily., Disp: , Rfl:  .  BAYER MICROLET LANCETS lancets, Use as instructed to check blood sugar 2 times per day dx code E11.65, Disp: 100 each, Rfl: 3 .  cyclobenzaprine (FLEXERIL) 5 MG tablet, Take 1 tablet (5 mg total) by mouth at bedtime., Disp: 10 tablet, Rfl: 0 .  Dulaglutide (TRULICITY) 6.23 JS/2.8BT SOPN, Inject 0.75 mg into the skin once a week., Disp: 2 mL, Rfl: 3 .  gabapentin (NEURONTIN) 100 MG capsule, Take 1 capsule (100 mg total) by mouth at bedtime., Disp: 90 capsule, Rfl: 0 .  glimepiride (AMARYL) 4 MG tablet, TAKE 1 TABLET(4 MG) BY MOUTH TWICE DAILY, Disp: 180 tablet, Rfl: 1 .  glucose blood (BAYER CONTOUR NEXT TEST) test strip, Use as instructed to check blood sugar 3 times per day at various different times. dx code E11.65 Bring meter to every visit., Disp: 100 each, Rfl: 3 .   insulin detemir (LEVEMIR FLEXTOUCH) 100 UNIT/ML FlexPen, Inject 42 Units into the skin daily., Disp: 15 mL, Rfl: 3 .  Insulin Pen Needle (BD PEN NEEDLE NANO 2ND GEN) 32G X 4 MM MISC, Use as directed once a daily ELL.65, Disp: 100 each, Rfl: 3 .  Lancets 30G MISC, Use to check blood sugars 5 times per day, Disp: , Rfl:  .  meloxicam (MOBIC) 7.5 MG tablet, Take 1 tablet (7.5 mg total) by mouth daily., Disp: 30 tablet, Rfl: 0 .  rosuvastatin (CRESTOR) 5 MG tablet, Take 1 tablet (5 mg total) by mouth daily., Disp: 90 tablet, Rfl: 3 .  tamsulosin (FLOMAX) 0.4 MG CAPS capsule, Take 1 capsule (0.4 mg total) by mouth daily., Disp: 30 capsule, Rfl: 3 .  zinc gluconate 50 MG tablet, Take 50 mg by mouth daily., Disp: , Rfl:   No Known Allergies Review of Systems Objective:  There were no vitals filed for this visit.  General: Well developed, nourished, in no acute distress, alert and oriented x3   Dermatological: Skin is warm, dry and supple bilateral. Nails x 10 are well maintained; remaining integument appears unremarkable at this time. There are no open sores, no preulcerative lesions, no rash or signs of infection present.  Vascular: Dorsalis Pedis artery and Posterior Tibial  artery pedal pulses are 2/4 bilateral with immedate capillary fill time. Pedal hair growth present. No varicosities and no lower extremity edema present bilateral.   Neruologic: Grossly intact via light touch bilateral. Vibratory intact via tuning fork bilateral. Protective threshold with Semmes Wienstein monofilament intact to all pedal sites bilateral. Patellar and Achilles deep tendon reflexes 2+ bilateral. No Babinski or clonus noted bilateral.   Musculoskeletal: No gross boney pedal deformities bilateral. No pain, crepitus, or limitation noted with foot and ankle range of motion bilateral. Muscular strength 5/5 in all groups tested bilateral.  Pain on palpation insertion site of the tibialis anterior tendon and the posterior  tibial tendon at the navicular.  Tendon is nonpalpable with dorsiflexion and inversion.  Exquisitely tender palpation of the insertion site of the tibialis anterior tendon.  Gait: Unassisted, Nonantalgic.    Radiographs:  Radiographs reviewed today demonstrate soft tissue swelling of the anterior medial aspect of the right foot.  No fractures are identified.  Mortise appears normal.  Assessment & Plan:   Assessment: Tibialis anterior tendon tear right foot.  Plan: Discussed etiology pathology conservative surgical therapies at this point in time having failed conservative therapies with bracing gabapentin and meloxicam from primary care providers and after careful physical examination determining that the tibialis anterior tendon is most likely torn at its insertion site of the right foot I feel that is necessary for an MRI for surgical as well as possible differential diagnoses.  I did write him a prescription for Celebrex 100 mg twice a day since he is diabetic and his hemoglobin A1c is already at 9.1 I cannot put him on a steroid.  I also placed him in a cam boot which she will wear at home he is unable to wear it at work.     Macaiah Mangal T. [place], [place]

## 2020-10-07 ENCOUNTER — Telehealth: Payer: Self-pay | Admitting: Podiatry

## 2020-10-07 ENCOUNTER — Other Ambulatory Visit: Payer: Self-pay

## 2020-10-07 ENCOUNTER — Encounter: Payer: Self-pay | Admitting: Nurse Practitioner

## 2020-10-07 ENCOUNTER — Ambulatory Visit: Payer: BC Managed Care – PPO | Admitting: Nurse Practitioner

## 2020-10-07 VITALS — BP 128/86 | HR 85 | Temp 98.5°F | Resp 16 | Ht 75.0 in | Wt 209.0 lb

## 2020-10-07 DIAGNOSIS — E1165 Type 2 diabetes mellitus with hyperglycemia: Secondary | ICD-10-CM

## 2020-10-07 DIAGNOSIS — I10 Essential (primary) hypertension: Secondary | ICD-10-CM

## 2020-10-07 DIAGNOSIS — E782 Mixed hyperlipidemia: Secondary | ICD-10-CM | POA: Diagnosis not present

## 2020-10-07 LAB — POCT GLYCOSYLATED HEMOGLOBIN (HGB A1C): Hemoglobin A1C: 9.9 % — AB (ref 4.0–5.6)

## 2020-10-07 MED ORDER — TRULICITY 1.5 MG/0.5ML ~~LOC~~ SOAJ: 1.5000 mg | SUBCUTANEOUS | 2 refills | Status: DC

## 2020-10-07 MED ORDER — EZETIMIBE 10 MG PO TABS: 10.0000 mg | ORAL_TABLET | Freq: Every day | ORAL | 3 refills | Status: DC

## 2020-10-07 MED ORDER — ROSUVASTATIN CALCIUM 40 MG PO TABS: 40.0000 mg | ORAL_TABLET | Freq: Every day | ORAL | 3 refills | Status: DC

## 2020-10-07 NOTE — Telephone Encounter (Signed)
Pt's wife called stating they have not heard back to schedule his MRI. I let her know it was ordered and to reach out if they don't hear back by Monday. She would also like for her number to be on the order instead of his because he isn't always able to reach his phone. Please advise.

## 2020-10-07 NOTE — Progress Notes (Signed)
[facility] [place], [place] [address]  Internal MEDICINE  Office Visit Note  Patient Name: Michael Gross  [id number]  [id number]  Date of Service: 10/09/2020  Chief Complaint  Patient presents with  . Follow-up    DOT paperwork   . Diabetes    A1C    HPI Estus presents for a follow up visit regarding diabetes and to recheck A1C. 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 His A1C is 9.9 today. 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  He went to the podiatrist he was referred to during his last office visit. They found a traumatic tear of the tibial tendon of the right foot.  Also according to his lipid panel drawn in December 2021, he is not on the optimal dose of rosuvastatin.  His current estimated 10-year ASCVD risk is >36.7%.   Current Medication: Outpatient Encounter Medications as of 10/07/2020  Medication Sig  . Ascorbic Acid (VITAMIN C) 100 MG tablet Take 100 mg by mouth daily.  Marland Kitchen BAYER MICROLET LANCETS lancets Use as instructed to check blood sugar 2 times per day dx code E11.65  . celecoxib (CELEBREX) 100 MG capsule Take 1 capsule (100 mg total) by mouth 2 (two) times daily.  . cyclobenzaprine (FLEXERIL) 5 MG tablet Take 1 tablet (5 mg total) by mouth at bedtime.  . Dulaglutide (TRULICITY) 1.5 LN/9.8XQ SOPN Inject 1.5 mg into the skin once a week.  . ezetimibe (ZETIA) 10 MG tablet Take 1 tablet (10 mg total) by mouth daily.  Marland Kitchen gabapentin (NEURONTIN) 100 MG capsule Take 1 capsule (100 mg total) by mouth at bedtime.  Marland Kitchen glimepiride (AMARYL) 4 MG tablet TAKE 1 TABLET(4 MG) BY MOUTH TWICE DAILY  . glucose blood (BAYER CONTOUR NEXT TEST) test strip Use as instructed to check blood sugar 3 times per day at various different times. dx code E11.65 Bring meter to every visit.  Marland Kitchen insulin detemir (LEVEMIR FLEXTOUCH) 100 UNIT/ML FlexPen Inject 42 Units into the skin daily.  . Insulin Pen Needle  (BD PEN NEEDLE NANO 2ND GEN) 32G X 4 MM MISC Use as directed once a daily ELL.65  . Lancets 30G MISC Use to check blood sugars 5 times per day  . meloxicam (MOBIC) 7.5 MG tablet Take 1 tablet (7.5 mg total) by mouth daily.  . rosuvastatin (CRESTOR) 40 MG tablet Take 1 tablet (40 mg total) by mouth daily.  . tamsulosin (FLOMAX) 0.4 MG CAPS capsule Take 1 capsule (0.4 mg total) by mouth daily.  Marland Kitchen zinc gluconate 50 MG tablet Take 50 mg by mouth daily.  . [DISCONTINUED] Dulaglutide (TRULICITY) 1.19 ER/7.4YC SOPN Inject 0.75 mg into the skin once a week.  . [DISCONTINUED] rosuvastatin (CRESTOR) 5 MG tablet Take 1 tablet (5 mg total) by mouth daily.   No facility-administered encounter medications on file as of 10/07/2020.    Surgical History: Past Surgical History:  Procedure Laterality Date  . HERNIA REPAIR     2003  . KNEE SURGERY     1998  . MOHS SURGERY     2003    Medical History: Past Medical History:  Diagnosis Date  . Basal cell carcinoma   . BPH (benign prostatic hyperplasia)   . Diabetes ([place])   . Hyperlipidemia     Family History: Family History  Problem Relation Age of Onset  . Heart disease Father   . Prostate cancer Neg Hx   .  Kidney cancer Neg Hx   . Bladder Cancer Neg Hx     Social History   Socioeconomic History  . Marital status: Married    Spouse name: Not on file  . Number of children: Not on file  . Years of education: Not on file  . Highest education level: Not on file  Occupational History  . Not on file  Tobacco Use  . Smoking status: Never Smoker  . Smokeless tobacco: Never Used  Vaping Use  . Vaping Use: Never used  Substance and Sexual Activity  . Alcohol use: Yes    Comment: rare  . Drug use: No  . Sexual activity: Not on file  Other Topics Concern  . Not on file  Social History Narrative  . Not on file   Social Determinants of Health   Financial Resource Strain: Not on file  Food Insecurity: Not on file  Transportation Needs:  Not on file  Physical Activity: Not on file  Stress: Not on file  Social Connections: Not on file  Intimate Partner Violence: Not on file      Review of Systems  Constitutional: Negative for chills, fatigue and unexpected weight change.  HENT: Negative for congestion, rhinorrhea, sneezing and sore throat.   Eyes: Negative for redness.  Respiratory: Negative for cough, chest tightness and shortness of breath.   Cardiovascular: Negative for chest pain and palpitations.  Gastrointestinal: Negative for abdominal pain, constipation, diarrhea, nausea and vomiting.  Genitourinary: Negative for dysuria and frequency.  Musculoskeletal: Negative for arthralgias, back pain, joint swelling and neck pain.  Skin: Negative for rash.  Neurological: Negative.  Negative for tremors and numbness.  Hematological: Negative for adenopathy. Does not bruise/bleed easily.  Psychiatric/Behavioral: Negative for behavioral problems (Depression), sleep disturbance and suicidal ideas. The patient is not nervous/anxious.     Vital Signs: BP 128/86   Pulse 85   Temp 98.5 F (36.9 C)   Resp 16   Ht 6\' 3"  (1.905 m)   Wt 209 lb (94.8 kg)   SpO2 97%   BMI 26.12 kg/m    Physical Exam Vitals reviewed.  Constitutional:      General: He is not in acute distress.    Appearance: Normal appearance. He is well-developed, well-groomed and overweight. He is not ill-appearing or toxic-appearing.  HENT:     Head: Normocephalic and atraumatic.  Cardiovascular:     Rate and Rhythm: Normal rate and regular rhythm.     Pulses: Normal pulses.     Heart sounds: Normal heart sounds.  Pulmonary:     Effort: Pulmonary effort is normal.     Breath sounds: Normal breath sounds.  Skin:    General: Skin is warm and dry.     Capillary Refill: Capillary refill takes less than 2 seconds.  Neurological:     Mental Status: He is alert and oriented to person, place, and time.  Psychiatric:        Behavior: Behavior is  cooperative.    Assessment/Plan: 1. Uncontrolled type 2 diabetes mellitus with hyperglycemia (HCC) A1C is 9.9 today, it was 9.1 3 months ago. 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 Due to his borderline K8L, Trulicity dose was increased. He is due to turn in his DOT paperwork to renew his certificate in July and has requested to have his A1C rechecked in a month. Diet modifications discussed as well including cutting out fast food, decreasing breads, starches, sweets, sodas.  -  POCT HgB A1C - Dulaglutide (TRULICITY) 1.5 GB/1.5VV SOPN; Inject 1.5 mg into the skin once a week.  Dispense: 3 mL; Refill: 2  2. Mixed hyperlipidemia Patient's estimated 10-year ASCVD risk is >36.7%. He is not currently on the optimal dose of rosuvastatin. Rosuvastatin increased to 40 mg daily and ezetimibe 10 mg daily was added to his medication regimen.  - ezetimibe (ZETIA) 10 MG tablet; Take 1 tablet (10 mg total) by mouth daily.  Dispense: 90 tablet; Refill: 3 - rosuvastatin (CRESTOR) 40 MG tablet; Take 1 tablet (40 mg total) by mouth daily.  Dispense: 90 tablet; Refill: 3  3. Essential hypertension Stable without any antihypertensive medications at this time.    General Counseling: dameon soltis understanding of the findings of todays visit and agrees with plan of treatment. I have discussed any further diagnostic evaluation that may be needed or ordered today. We also reviewed his medications today. he has been encouraged to call the office with any questions or concerns that should arise related to todays visit.    Orders Placed This Encounter  Procedures  . POCT HgB A1C    Meds ordered this encounter  Medications  . Dulaglutide (TRULICITY) 1.5 OH/6.0VP SOPN    Sig: Inject 1.5 mg into the skin once a week.    Dispense:  3 mL    Refill:  2  . ezetimibe (ZETIA) 10 MG tablet    Sig: Take 1 tablet (10 mg total) by mouth daily.    Dispense:  90  tablet    Refill:  3  . rosuvastatin (CRESTOR) 40 MG tablet    Sig: Take 1 tablet (40 mg total) by mouth daily.    Dispense:  90 tablet    Refill:  3    Return in about 5 weeks (around 11/11/2020) for F/U, Recheck A1C, Neville Pauls PCP, due to borderline A!C, patient request to have it rechecked.   Total time spent:30 Minutes Time spent includes review of chart, medications, test results, and follow up plan with the patient.   [place] Controlled Substance Database was reviewed by me.  This patient was seen by Jonetta Osgood, FNP-C in collaboration with Dr. Clayborn Bigness as a part of collaborative care agreement.   Tabbetha Kutscher R. Valetta Fuller, MSN, FNP-C Internal medicine

## 2020-10-07 NOTE — Telephone Encounter (Signed)
Messaging with Marisue Humble at [facility] and she checked on this... looks like pre-cert is not needed. Forwarded to one of their schedulers and they will contact the patient.

## 2020-10-15 ENCOUNTER — Other Ambulatory Visit: Payer: BC Managed Care – PPO

## 2020-10-20 ENCOUNTER — Ambulatory Visit
Admission: RE | Admit: 2020-10-20 | Discharge: 2020-10-20 | Disposition: A | Discharge status: Home / Self Care | Payer: BC Managed Care – PPO | Source: Ambulatory Visit | Attending: Podiatry | Admitting: Podiatry

## 2020-10-20 ENCOUNTER — Other Ambulatory Visit: Payer: Self-pay

## 2020-10-20 DIAGNOSIS — S86219A Strain of muscle(s) and tendon(s) of anterior muscle group at lower leg level, unspecified leg, initial encounter: Secondary | ICD-10-CM

## 2020-10-20 DIAGNOSIS — M65871 Other synovitis and tenosynovitis, right ankle and foot: Secondary | ICD-10-CM | POA: Diagnosis not present

## 2020-10-20 DIAGNOSIS — S86811A Strain of other muscle(s) and tendon(s) at lower leg level, right leg, initial encounter: Secondary | ICD-10-CM | POA: Diagnosis not present

## 2020-10-20 DIAGNOSIS — M25471 Effusion, right ankle: Secondary | ICD-10-CM | POA: Diagnosis not present

## 2020-10-20 DIAGNOSIS — M7989 Other specified soft tissue disorders: Secondary | ICD-10-CM | POA: Diagnosis not present

## 2020-11-03 ENCOUNTER — Other Ambulatory Visit: Payer: Self-pay

## 2020-11-03 ENCOUNTER — Other Ambulatory Visit: Payer: BC Managed Care – PPO

## 2020-11-03 ENCOUNTER — Encounter: Payer: Self-pay | Admitting: Podiatry

## 2020-11-03 ENCOUNTER — Ambulatory Visit (INDEPENDENT_AMBULATORY_CARE_PROVIDER_SITE_OTHER): Payer: BC Managed Care – PPO | Admitting: Podiatry

## 2020-11-03 DIAGNOSIS — M21371 Foot drop, right foot: Secondary | ICD-10-CM | POA: Diagnosis not present

## 2020-11-03 DIAGNOSIS — S86219A Strain of muscle(s) and tendon(s) of anterior muscle group at lower leg level, unspecified leg, initial encounter: Secondary | ICD-10-CM | POA: Diagnosis not present

## 2020-11-03 NOTE — Progress Notes (Signed)
He presents today for follow-up of his MRI to his right foot.  States that his foot feels like it is doing better.  Objective: Vital signs are stable alert and oriented x3.  MRI states that is the complete tear or rupture of the tibialis anterior tendon.  Assessment tibialis anterior tendon tear right.  Plan: Discussed etiology pathology conservative versus surgical therapies.  At this point his hemoglobin A1c is 9.9 and surgery is probably not the best option for him tendon would probably not heal all the way and we would risk a wound that may not heal as well.  Also we did discuss getting the sugar under control and then doing surgery after that.  He is going to follow-up with primary for blood work in the next few days again.  In the meantime I think physical therapy to strengthen all of the dorsiflexors would help with the foot drop also I think that would help with the pain as well.  So we are going to request that from [facility] physical therapy.  Also we are going to ask an EJ to fabricate a ankle-foot orthosis possibly at thin fiberglass or plastic that comes up the back of the leg and attaches with a single strap.  I will leave it up to him to make something small and streamlined.

## 2020-11-09 ENCOUNTER — Telehealth: Payer: Self-pay

## 2020-11-09 DIAGNOSIS — M21371 Foot drop, right foot: Secondary | ICD-10-CM | POA: Diagnosis not present

## 2020-11-09 DIAGNOSIS — R2689 Other abnormalities of gait and mobility: Secondary | ICD-10-CM | POA: Diagnosis not present

## 2020-11-09 DIAGNOSIS — M25571 Pain in right ankle and joints of right foot: Secondary | ICD-10-CM | POA: Diagnosis not present

## 2020-11-09 NOTE — Telephone Encounter (Signed)
Left vm to screen for 11/10/20 appointment-Toni

## 2020-11-10 ENCOUNTER — Encounter: Payer: Self-pay | Admitting: Nurse Practitioner

## 2020-11-10 ENCOUNTER — Ambulatory Visit: Payer: BC Managed Care – PPO | Admitting: Nurse Practitioner

## 2020-11-10 ENCOUNTER — Other Ambulatory Visit: Payer: Self-pay

## 2020-11-10 VITALS — BP 113/70 | HR 75 | Temp 98.5°F | Resp 16 | Ht 75.0 in | Wt 206.2 lb

## 2020-11-10 DIAGNOSIS — E1165 Type 2 diabetes mellitus with hyperglycemia: Secondary | ICD-10-CM

## 2020-11-10 DIAGNOSIS — E782 Mixed hyperlipidemia: Secondary | ICD-10-CM

## 2020-11-10 DIAGNOSIS — I10 Essential (primary) hypertension: Secondary | ICD-10-CM

## 2020-11-10 DIAGNOSIS — M79671 Pain in right foot: Secondary | ICD-10-CM | POA: Diagnosis not present

## 2020-11-10 LAB — POCT GLYCOSYLATED HEMOGLOBIN (HGB A1C): Hemoglobin A1C: 9.3 % — AB (ref 4.0–5.6)

## 2020-11-10 NOTE — Progress Notes (Signed)
[facility] [place], [place] [address]  Internal MEDICINE  Office Visit Note  Patient Name: Michael Gross  [id number]  [id number]  Date of Service: 11/10/2020  Chief Complaint  Patient presents with   Basal Cell Carcinoma   Benign Prostatic Hypertrophy   Hyperlipidemia   Diabetes    HPI Andoni presents for a follow up visit for diabetes, hyperlipidemia, and BPH. Rommie has requested to have his A1C drawn again today. It has been approximately 1 month since his A1C was checked and it was 9.9. 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 The cut off for A1C is 10.0, and a driver would be disqualified. Jhoel was worried that an A1C of 9.9 would be "cutting it too close" . A1C is 9.3 today.  His blood pressure is 113/70 which is stable.  He was previously referred to podiatry. His right foot sustained a traumatic tear of the tibial tendon. This must be surgically repaired but he is not able to do that at this time.  He does not need any refills   Current Medication: Outpatient Encounter Medications as of 11/10/2020  Medication Sig   Ascorbic Acid (VITAMIN C) 100 MG tablet Take 100 mg by mouth daily.   BAYER MICROLET LANCETS lancets Use as instructed to check blood sugar 2 times per day dx code E11.65   celecoxib (CELEBREX) 100 MG capsule Take 1 capsule (100 mg total) by mouth 2 (two) times daily.   Dulaglutide (TRULICITY) 1.5 JK/0.9FG SOPN Inject 1.5 mg into the skin once a week.   ezetimibe (ZETIA) 10 MG tablet Take 1 tablet (10 mg total) by mouth daily.   glimepiride (AMARYL) 4 MG tablet TAKE 1 TABLET(4 MG) BY MOUTH TWICE DAILY   glucose blood (BAYER CONTOUR NEXT TEST) test strip Use as instructed to check blood sugar 3 times per day at various different times. dx code E11.65 Bring meter to every visit.   Insulin Pen Needle (BD PEN NEEDLE NANO 2ND GEN) 32G X 4 MM MISC Use as directed once a daily ELL.65   Lancets 30G MISC Use  to check blood sugars 5 times per day   rosuvastatin (CRESTOR) 40 MG tablet Take 1 tablet (40 mg total) by mouth daily.   tamsulosin (FLOMAX) 0.4 MG CAPS capsule Take 1 capsule (0.4 mg total) by mouth daily.   zinc gluconate 50 MG tablet Take 50 mg by mouth daily.   [DISCONTINUED] insulin detemir (LEVEMIR FLEXTOUCH) 100 UNIT/ML FlexPen Inject 42 Units into the skin daily.   cyclobenzaprine (FLEXERIL) 5 MG tablet Take 1 tablet (5 mg total) by mouth at bedtime. (Patient not taking: Reported on 11/10/2020)   gabapentin (NEURONTIN) 100 MG capsule Take 1 capsule (100 mg total) by mouth at bedtime. (Patient not taking: Reported on 11/10/2020)   meloxicam (MOBIC) 7.5 MG tablet Take 1 tablet (7.5 mg total) by mouth daily. (Patient not taking: Reported on 11/10/2020)   No facility-administered encounter medications on file as of 11/10/2020.    Surgical History: Past Surgical History:  Procedure Laterality Date   HERNIA REPAIR     2003   [place]   MOHS SURGERY     2003    Medical History: Past Medical History:  Diagnosis Date   Basal cell carcinoma    BPH (benign prostatic hyperplasia)    Diabetes ([place])    Hyperlipidemia     Family History: Family History  Problem Relation  Age of Onset   Heart disease Father    Prostate cancer Neg Hx    Kidney cancer Neg Hx    Bladder Cancer Neg Hx     Social History   Socioeconomic History   Marital status: Married    Spouse name: Not on file   Number of children: Not on file   Years of education: Not on file   Highest education level: Not on file  Occupational History   Not on file  Tobacco Use   Smoking status: Never   Smokeless tobacco: Never  Vaping Use   Vaping Use: Never used  Substance and Sexual Activity   Alcohol use: Yes    Comment: rare   Drug use: No   Sexual activity: Not on file  Other Topics Concern   Not on file  Social History Narrative   Not on file   Social Determinants of Health   Financial Resource  Strain: Not on file  Food Insecurity: Not on file  Transportation Needs: Not on file  Physical Activity: Not on file  Stress: Not on file  Social Connections: Not on file  Intimate Partner Violence: Not on file      Review of Systems  Constitutional:  Negative for chills, fatigue and unexpected weight change.  HENT:  Negative for congestion, rhinorrhea, sneezing and sore throat.   Eyes:  Negative for redness.  Respiratory:  Negative for cough, chest tightness and shortness of breath.   Cardiovascular:  Negative for chest pain and palpitations.  Gastrointestinal:  Negative for abdominal pain, constipation, diarrhea, nausea and vomiting.  Genitourinary:  Negative for dysuria and frequency.  Musculoskeletal:  Negative for arthralgias, back pain, joint swelling and neck pain.  Skin:  Negative for rash.  Neurological: Negative.  Negative for tremors and numbness.  Hematological:  Negative for adenopathy. Does not bruise/bleed easily.  Psychiatric/Behavioral:  Negative for behavioral problems (Depression), sleep disturbance and suicidal ideas. The patient is not nervous/anxious.    Vital Signs: BP 113/70   Pulse 75   Temp 98.5 F (36.9 C)   Resp 16   Ht 6\' 3"  (1.905 m)   Wt 206 lb 3.2 oz (93.5 kg)   SpO2 94%   BMI 25.77 kg/m    Physical Exam Vitals reviewed.  Constitutional:      General: He is not in acute distress.    Appearance: Normal appearance. He is well-developed. He is obese. He is not ill-appearing or diaphoretic.  HENT:     Head: Normocephalic and atraumatic.  Neck:     Thyroid: No thyromegaly.     Vascular: No JVD.     Trachea: No tracheal deviation.  Cardiovascular:     Rate and Rhythm: Normal rate and regular rhythm.     Pulses: Normal pulses.     Heart sounds: Normal heart sounds. No murmur heard.   No friction rub. No gallop.  Pulmonary:     Effort: Pulmonary effort is normal. No respiratory distress.     Breath sounds: Normal breath sounds. No  wheezing or rales.  Chest:     Chest wall: No tenderness.  Skin:    General: Skin is warm and dry.     Capillary Refill: Capillary refill takes less than 2 seconds.  Neurological:     Mental Status: He is alert and oriented to person, place, and time.     Cranial Nerves: No cranial nerve deficit.  Psychiatric:        Mood and Affect: Mood  normal.        Behavior: Behavior normal.     Assessment/Plan: 1. Uncontrolled type 2 diabetes mellitus with hyperglycemia (HCC) A1C checked again, patient is aware that it has only been a month and his insurance may not cover the A1C check. A1C improved dropped to 9.3.recheck in 3 months.  - POCT glycosylated hemoglobin (Hb A1C)  2. Essential hypertension Stable with current medications  3. Mixed hyperlipidemia Sabe with current medications.   4. Right foot pain Seen by podiatry, traumatic tibial tendon tear of right foot. Was told he would need surgery.    General Counseling: sivan quast understanding of the findings of todays visit and agrees with plan of treatment. I have discussed any further diagnostic evaluation that may be needed or ordered today. We also reviewed his medications today. he has been encouraged to call the office with any questions or concerns that should arise related to todays visit.    Orders Placed This Encounter  Procedures   POCT glycosylated hemoglobin (Hb A1C)    No orders of the defined types were placed in this encounter.   Return in about 3 months (around 02/10/2021) for F/U, Recheck A1C, Neva Ramaswamy PCP.   Total time spent:30 Minutes Time spent includes review of chart, medications, test results, and follow up plan with the patient.   [place] Controlled Substance Database was reviewed by me.  This patient was seen by Jonetta Osgood, FNP-C in collaboration with Dr. Clayborn Bigness as a part of collaborative care agreement.   Zeba Luby R. Valetta Fuller, MSN, FNP-C Internal medicine

## 2020-11-11 DIAGNOSIS — M25571 Pain in right ankle and joints of right foot: Secondary | ICD-10-CM | POA: Diagnosis not present

## 2020-11-11 DIAGNOSIS — M21371 Foot drop, right foot: Secondary | ICD-10-CM | POA: Diagnosis not present

## 2020-11-11 DIAGNOSIS — R2689 Other abnormalities of gait and mobility: Secondary | ICD-10-CM | POA: Diagnosis not present

## 2020-11-12 ENCOUNTER — Telehealth: Payer: Self-pay | Admitting: Podiatry

## 2020-11-12 NOTE — Telephone Encounter (Signed)
Pt scheduled for 7.11 for a brace and needs to see EJ for the brace casting and fitting I have left message for pt to call to r/s but next available is not until mid august with EJ.(Needs to be 30 minute appt)

## 2020-11-15 ENCOUNTER — Other Ambulatory Visit: Payer: Self-pay

## 2020-11-15 ENCOUNTER — Other Ambulatory Visit: Payer: BC Managed Care – PPO

## 2020-11-15 MED ORDER — LEVEMIR FLEXTOUCH 100 UNIT/ML ~~LOC~~ SOPN: 42.0000 [IU] | PEN_INJECTOR | Freq: Every day | SUBCUTANEOUS | 3 refills | Status: DC

## 2020-11-15 MED ORDER — LEVEMIR FLEXTOUCH 100 UNIT/ML ~~LOC~~ SOPN: 48.0000 [IU] | PEN_INJECTOR | Freq: Every day | SUBCUTANEOUS | 3 refills | Status: DC

## 2020-11-15 NOTE — Telephone Encounter (Signed)
Pt called that he need  refills for levemir 48 units as per alyysa send and change in computer as well and send for 90 days

## 2020-11-16 ENCOUNTER — Telehealth: Payer: Self-pay

## 2020-11-16 DIAGNOSIS — R2689 Other abnormalities of gait and mobility: Secondary | ICD-10-CM | POA: Diagnosis not present

## 2020-11-16 DIAGNOSIS — M21371 Foot drop, right foot: Secondary | ICD-10-CM | POA: Diagnosis not present

## 2020-11-16 DIAGNOSIS — M25571 Pain in right ankle and joints of right foot: Secondary | ICD-10-CM | POA: Diagnosis not present

## 2020-11-16 NOTE — Telephone Encounter (Signed)
Lmom for that he need Diabetic eye exam if not done this year he need referral we can do it

## 2020-12-10 ENCOUNTER — Other Ambulatory Visit: Payer: BC Managed Care – PPO

## 2020-12-10 NOTE — Telephone Encounter (Signed)
Called pt LM on VM for him to call and schedule an appt for the 31st.

## 2020-12-13 ENCOUNTER — Ambulatory Visit: Payer: BC Managed Care – PPO | Admitting: Dermatology

## 2021-02-14 ENCOUNTER — Ambulatory Visit (INDEPENDENT_AMBULATORY_CARE_PROVIDER_SITE_OTHER): Payer: Medicare Other | Admitting: Dermatology

## 2021-02-14 ENCOUNTER — Encounter: Payer: Self-pay | Admitting: Dermatology

## 2021-02-14 ENCOUNTER — Other Ambulatory Visit: Payer: Self-pay

## 2021-02-14 DIAGNOSIS — Z85828 Personal history of other malignant neoplasm of skin: Secondary | ICD-10-CM | POA: Diagnosis not present

## 2021-02-14 DIAGNOSIS — L821 Other seborrheic keratosis: Secondary | ICD-10-CM

## 2021-02-14 DIAGNOSIS — D692 Other nonthrombocytopenic purpura: Secondary | ICD-10-CM

## 2021-02-14 DIAGNOSIS — L814 Other melanin hyperpigmentation: Secondary | ICD-10-CM

## 2021-02-14 DIAGNOSIS — D229 Melanocytic nevi, unspecified: Secondary | ICD-10-CM

## 2021-02-14 DIAGNOSIS — L578 Other skin changes due to chronic exposure to nonionizing radiation: Secondary | ICD-10-CM

## 2021-02-14 DIAGNOSIS — Z1283 Encounter for screening for malignant neoplasm of skin: Secondary | ICD-10-CM | POA: Diagnosis not present

## 2021-02-14 DIAGNOSIS — L570 Actinic keratosis: Secondary | ICD-10-CM

## 2021-02-14 DIAGNOSIS — D1801 Hemangioma of skin and subcutaneous tissue: Secondary | ICD-10-CM

## 2021-02-14 DIAGNOSIS — L820 Inflamed seborrheic keratosis: Secondary | ICD-10-CM

## 2021-02-14 MED ORDER — BD PEN NEEDLE NANO 2ND GEN 32G X 4 MM MISC: 3 refills | Status: DC

## 2021-02-14 NOTE — Telephone Encounter (Signed)
Pt LMOM to change pharmacy to [facility] in [place] on 02/14/21

## 2021-02-14 NOTE — Progress Notes (Signed)
New Patient Visit  Subjective  Michael Gross is a 65 y.o. male who presents for the following: Nevus (Check moles. Waist up exam. PCP recommended exam to r/o abnormal moles. ). Hx of BCC left cheek ~20 years ago. The patient presents for Upper Body Skin Exam (UBSE) for skin cancer screening and mole check.  Wife with patient.  Referral from Suzan Garibaldi, MD  Review of Systems: No other skin or systemic complaints except as noted in HPI or Assessment and Plan.  Objective  Well appearing patient in no apparent distress; mood and affect are within normal limits.  All skin waist up examined.  Right Forehead x2 (2) Erythematous thin papules/macules with gritty scale.   Right Anterior Neck x1, right lower eyelid margin x1 (2) Erythematous keratotic or waxy stuck-on papule or plaque.   Assessment & Plan   Lentigines - Scattered tan macules - Due to sun exposure - Benign-appearing, observe - Recommend daily broad spectrum sunscreen SPF 30+ to sun-exposed areas, reapply every 2 hours as needed. - Call for any changes  Seborrheic Keratoses - Stuck-on, waxy, tan-brown papules and/or plaques  - Benign-appearing - Discussed benign etiology and prognosis. - Observe - Call for any changes  Melanocytic Nevi - Tan-brown and/or pink-flesh-colored symmetric macules and papules - Benign appearing on exam today - Observation - Call clinic for new or changing moles - Recommend daily use of broad spectrum spf 30+ sunscreen to sun-exposed areas.   Hemangiomas - Red papules - Discussed benign nature - Observe - Call for any changes  Actinic Damage - Chronic condition, secondary to cumulative UV/sun exposure - diffuse scaly erythematous macules with underlying dyspigmentation - Recommend daily broad spectrum sunscreen SPF 30+ to sun-exposed areas, reapply every 2 hours as needed.  - Staying in the shade or wearing long sleeves, sun glasses (UVA+UVB protection) and wide brim hats  (4-inch brim around the entire circumference of the hat) are also recommended for sun protection.  - Call for new or changing lesions.  Skin cancer screening performed today.  AK (actinic keratosis) (2) Right Forehead x2  Actinic keratoses are precancerous spots that appear secondary to cumulative UV radiation exposure/sun exposure over time. They are chronic with expected duration over 1 year. A portion of actinic keratoses will progress to squamous cell carcinoma of the skin. It is not possible to reliably predict which spots will progress to skin cancer and so treatment is recommended to prevent development of skin cancer.  Recommend daily broad spectrum sunscreen SPF 30+ to sun-exposed areas, reapply every 2 hours as needed.  Recommend staying in the shade or wearing long sleeves, sun glasses (UVA+UVB protection) and wide brim hats (4-inch brim around the entire circumference of the hat). Call for new or changing lesions.  Destruction of lesion - Right Forehead x2 Complexity: simple   Destruction method: cryotherapy   Informed consent: discussed and consent obtained   Timeout:  patient name, date of birth, surgical site, and procedure verified Lesion destroyed using liquid nitrogen: Yes   Region frozen until ice ball extended beyond lesion: Yes   Outcome: patient tolerated procedure well with no complications   Post-procedure details: wound care instructions given    Inflamed seborrheic keratosis Right Anterior Neck x1, right lower eyelid margin x1  Destruction of lesion - Right Anterior Neck x1, right lower eyelid margin x1 Complexity: simple   Destruction method: cryotherapy   Informed consent: discussed and consent obtained   Timeout:  patient name, date of birth, surgical site,  and procedure verified Lesion destroyed using liquid nitrogen: Yes   Region frozen until ice ball extended beyond lesion: Yes   Outcome: patient tolerated procedure well with no complications    Post-procedure details: wound care instructions given    Skin cancer screening  History of Basal Cell Carcinoma of the Skin left cheek. - No evidence of recurrence today - Recommend regular full body skin exams - Recommend daily broad spectrum sunscreen SPF 30+ to sun-exposed areas, reapply every 2 hours as needed.  - Call if any new or changing lesions are noted between office visits   Purpura - Chronic; persistent and recurrent.  Treatable, but not curable. - Violaceous macules and patches - Benign - Related to trauma, age, sun damage and/or use of blood thinners, chronic use of topical and/or oral steroids - Observe - Can use OTC arnica containing moisturizer such as Dermend Bruise Formula if desired - Call for worsening or other concerns  Return for AK, ISK recheck 3-4 months. Loraine Maple, CMA, am acting as scribe for Sarina Ser, MD. Documentation: I have reviewed the above documentation for accuracy and completeness, and I agree with the above.  Sarina Ser, MD

## 2021-02-14 NOTE — Patient Instructions (Signed)
Prior to procedure, discussed risks of blister formation, small wound, skin dyspigmentation, or rare scar following cryotherapy. Recommend Vaseline ointment to treated areas while healing.   Cryotherapy Aftercare  Wash gently with soap and water everyday.   Apply Vaseline and Band-Aid daily until healed.   Melanoma ABCDEs  Melanoma is the most dangerous type of skin cancer, and is the leading cause of death from skin disease.  You are more likely to develop melanoma if you: Have light-colored skin, light-colored eyes, or red or blond hair Spend a lot of time in the sun Tan regularly, either outdoors or in a tanning bed Have had blistering sunburns, especially during childhood Have a close family member who has had a melanoma Have atypical moles or large birthmarks  Early detection of melanoma is key since treatment is typically straightforward and cure rates are extremely high if we catch it early.   The first sign of melanoma is often a change in a mole or a new dark spot.  The ABCDE system is a way of remembering the signs of melanoma.  A for asymmetry:  The two halves do not match. B for border:  The edges of the growth are irregular. C for color:  A mixture of colors are present instead of an even brown color. D for diameter:  Melanomas are usually (but not always) greater than 32mm - the size of a pencil eraser. E for evolution:  The spot keeps changing in size, shape, and color.  Please check your skin once per month between visits. You can use a small mirror in front and a large mirror behind you to keep an eye on the back side or your body.   If you see any new or changing lesions before your next follow-up, please call to schedule a visit.  Please continue daily skin protection including broad spectrum sunscreen SPF 30+ to sun-exposed areas, reapplying every 2 hours as needed when you're outdoors.   Staying in the shade or wearing long sleeves, sun glasses (UVA+UVB  protection) and wide brim hats (4-inch brim around the entire circumference of the hat) are also recommended for sun protection.   If you have any questions or concerns for your doctor, please call our main line at [phone] and press option 4 to reach your doctor's medical assistant. If no one answers, please leave a voicemail as directed and we will return your call as soon as possible. Messages left after 4 pm will be answered the following business day.   You may also send [place] a message via [place]. We typically respond to MyChart messages within 1-2 business days.  For prescription refills, please ask your pharmacy to contact our office. Our fax number is [phone].  If you have an urgent issue when the clinic is closed that cannot wait until the next business day, you can page your doctor at the number below.    Please note that while we do our best to be available for urgent issues outside of office hours, we are not available 24/7.   If you have an urgent issue and are unable to reach [place], you may choose to seek medical care at your doctor's office, retail clinic, urgent care center, or emergency room.  If you have a medical emergency, please immediately call 911 or go to the emergency department.  Pager Numbers  - Dr. Nehemiah Massed: [phone]  - Dr. Laurence Ferrari: [phone]  - Dr. Nicole Kindred: [phone]  In the event of inclement weather, please call  our main line at [phone] for an update on the status of any delays or closures.  Dermatology Medication Tips: Please keep the boxes that topical medications come in in order to help keep track of the instructions about where and how to use these. Pharmacies typically print the medication instructions only on the boxes and not directly on the medication tubes.   If your medication is too expensive, please contact our office at [phone] option 4 or send [place] a message through [place].   We are unable to tell what your co-pay for  medications will be in advance as this is different depending on your insurance coverage. However, we may be able to find a substitute medication at lower cost or fill out paperwork to get insurance to cover a needed medication.   If a prior authorization is required to get your medication covered by your insurance company, please allow [place] 1-2 business days to complete this process.  Drug prices often vary depending on where the prescription is filled and some pharmacies may offer cheaper prices.  The website www.goodrx.com contains coupons for medications through different pharmacies. The prices here do not account for what the cost may be with help from insurance (it may be cheaper with your insurance), but the website can give you the price if you did not use any insurance.  - You can print the associated coupon and take it with your prescription to the pharmacy.  - You may also stop by our office during regular business hours and pick up a GoodRx coupon card.  - If you need your prescription sent electronically to a different pharmacy, notify our office through [facility] or by phone at [phone] option 4.

## 2021-02-15 ENCOUNTER — Ambulatory Visit: Payer: BC Managed Care – PPO | Admitting: Nurse Practitioner

## 2021-02-15 ENCOUNTER — Encounter: Payer: Self-pay | Admitting: Dermatology

## 2021-02-16 ENCOUNTER — Other Ambulatory Visit: Payer: Self-pay

## 2021-02-16 ENCOUNTER — Ambulatory Visit: Payer: BC Managed Care – PPO | Admitting: Nurse Practitioner

## 2021-02-17 ENCOUNTER — Ambulatory Visit (INDEPENDENT_AMBULATORY_CARE_PROVIDER_SITE_OTHER): Payer: Medicare Other | Admitting: Nurse Practitioner

## 2021-02-17 ENCOUNTER — Encounter: Payer: Self-pay | Admitting: Nurse Practitioner

## 2021-02-17 ENCOUNTER — Other Ambulatory Visit: Payer: Self-pay

## 2021-02-17 VITALS — BP 114/76 | HR 80 | Temp 98.5°F | Resp 16 | Ht 75.0 in | Wt 208.0 lb

## 2021-02-17 DIAGNOSIS — N401 Enlarged prostate with lower urinary tract symptoms: Secondary | ICD-10-CM | POA: Diagnosis not present

## 2021-02-17 DIAGNOSIS — E1165 Type 2 diabetes mellitus with hyperglycemia: Secondary | ICD-10-CM

## 2021-02-17 DIAGNOSIS — M21371 Foot drop, right foot: Secondary | ICD-10-CM

## 2021-02-17 DIAGNOSIS — E782 Mixed hyperlipidemia: Secondary | ICD-10-CM

## 2021-02-17 DIAGNOSIS — R350 Frequency of micturition: Secondary | ICD-10-CM

## 2021-02-17 LAB — POCT GLYCOSYLATED HEMOGLOBIN (HGB A1C): Hemoglobin A1C: 10.9 % — AB (ref 4.0–5.6)

## 2021-02-17 MED ORDER — BD PEN NEEDLE NANO 2ND GEN 32G X 4 MM MISC: 3 refills | Status: DC

## 2021-02-17 MED ORDER — DAPAGLIFLOZIN PROPANEDIOL 10 MG PO TABS: 10.0000 mg | ORAL_TABLET | Freq: Every day | ORAL | 2 refills | Status: DC

## 2021-02-17 MED ORDER — EZETIMIBE 10 MG PO TABS: 10.0000 mg | ORAL_TABLET | Freq: Every day | ORAL | 3 refills | Status: DC

## 2021-02-17 MED ORDER — GLIMEPIRIDE 4 MG PO TABS: ORAL_TABLET | ORAL | 1 refills | Status: DC

## 2021-02-17 MED ORDER — TAMSULOSIN HCL 0.4 MG PO CAPS: 0.4000 mg | ORAL_CAPSULE | Freq: Every day | ORAL | 3 refills | Status: DC

## 2021-02-17 MED ORDER — ROSUVASTATIN CALCIUM 40 MG PO TABS: 40.0000 mg | ORAL_TABLET | Freq: Every day | ORAL | 3 refills | Status: DC

## 2021-02-17 NOTE — Progress Notes (Signed)
[facility] [place], [place] [address]  Internal MEDICINE  Office Visit Note  Patient Name: Michael Gross  [id number]  [id number]  Date of Service: 02/17/2021  Chief Complaint  Patient presents with   Follow-up   Diabetes   Hyperlipidemia    HPI Michael Gross presents for a follow up visit for diabetes and hyperlipidemia. He retired in July this year and due to his insurance no kicking in [place] a couple of months, he has been out of his diabetic medications. He also reports that his diet has not been ideal. It is time to check his A1C again and it was 10.9 today. He is aware of the diet modifications he should make to help his glucose levels come down. He also has his medications now and is able to take the diabetic medications he has bee prescribed.  He is having neuropathy and numbness in his feet and has right foot drop due to a torn tendon. He reports that he will most likely have to have surgery to fix the tendon.     Current Medication: Outpatient Encounter Medications as of 02/17/2021  Medication Sig   Ascorbic Acid (VITAMIN C) 100 MG tablet Take 100 mg by mouth daily.   BAYER MICROLET LANCETS lancets Use as instructed to check blood sugar 2 times per day dx code E11.65   dapagliflozin propanediol (FARXIGA) 10 MG TABS tablet Take 1 tablet (10 mg total) by mouth daily before breakfast.   glucose blood (BAYER CONTOUR NEXT TEST) test strip Use as instructed to check blood sugar 3 times per day at various different times. dx code E11.65 Bring meter to every visit.   Lancets 30G MISC Use to check blood sugars 5 times per day   zinc gluconate 50 MG tablet Take 50 mg by mouth daily.   [DISCONTINUED] celecoxib (CELEBREX) 100 MG capsule Take 1 capsule (100 mg total) by mouth 2 (two) times daily.   [DISCONTINUED] ezetimibe (ZETIA) 10 MG tablet Take 1 tablet (10 mg total) by mouth daily.   [DISCONTINUED] glimepiride (AMARYL) 4 MG tablet TAKE 1 TABLET(4 MG) BY  MOUTH TWICE DAILY   [DISCONTINUED] Insulin Pen Needle (BD PEN NEEDLE NANO 2ND GEN) 32G X 4 MM MISC Use as directed once a daily ELL.65   [DISCONTINUED] rosuvastatin (CRESTOR) 40 MG tablet Take 1 tablet (40 mg total) by mouth daily.   [DISCONTINUED] tamsulosin (FLOMAX) 0.4 MG CAPS capsule Take 1 capsule (0.4 mg total) by mouth daily.   ezetimibe (ZETIA) 10 MG tablet Take 1 tablet (10 mg total) by mouth daily.   glimepiride (AMARYL) 4 MG tablet TAKE 1 TABLET(4 MG) BY MOUTH TWICE DAILY   rosuvastatin (CRESTOR) 40 MG tablet Take 1 tablet (40 mg total) by mouth daily.   tamsulosin (FLOMAX) 0.4 MG CAPS capsule Take 1 capsule (0.4 mg total) by mouth daily.   [DISCONTINUED] cyclobenzaprine (FLEXERIL) 5 MG tablet Take 1 tablet (5 mg total) by mouth at bedtime. (Patient not taking: Reported on 11/10/2020)   [DISCONTINUED] Dulaglutide (TRULICITY) 1.5 GE/3.6OQ SOPN Inject 1.5 mg into the skin once a week. (Patient not taking: Reported on 02/17/2021)   [DISCONTINUED] gabapentin (NEURONTIN) 100 MG capsule Take 1 capsule (100 mg total) by mouth at bedtime. (Patient not taking: Reported on 11/10/2020)   [DISCONTINUED] insulin detemir (LEVEMIR FLEXTOUCH) 100 UNIT/ML FlexPen Inject 48 Units into the skin daily.   [DISCONTINUED] Insulin Pen Needle (BD PEN NEEDLE NANO 2ND GEN) 32G X 4 MM MISC Use as directed once a  daily ELL.65   [DISCONTINUED] meloxicam (MOBIC) 7.5 MG tablet Take 1 tablet (7.5 mg total) by mouth daily. (Patient not taking: Reported on 11/10/2020)   No facility-administered encounter medications on file as of 02/17/2021.    Surgical History: Past Surgical History:  Procedure Laterality Date   HERNIA REPAIR     2003   [place]   MOHS SURGERY     2003    Medical History: Past Medical History:  Diagnosis Date   Basal cell carcinoma    BPH (benign prostatic hyperplasia)    Diabetes ([place])    Hyperlipidemia     Family History: Family History  Problem Relation Age of Onset    Heart disease Father    Prostate cancer Neg Hx    Kidney cancer Neg Hx    Bladder Cancer Neg Hx     Social History   Socioeconomic History   Marital status: Married    Spouse name: Not on file   Number of children: Not on file   Years of education: Not on file   Highest education level: Not on file  Occupational History   Not on file  Tobacco Use   Smoking status: Never   Smokeless tobacco: Never  Vaping Use   Vaping Use: Never used  Substance and Sexual Activity   Alcohol use: Yes    Comment: rare   Drug use: No   Sexual activity: Not on file  Other Topics Concern   Not on file  Social History Narrative   Not on file   Social Determinants of Health   Financial Resource Strain: Not on file  Food Insecurity: Not on file  Transportation Needs: Not on file  Physical Activity: Not on file  Stress: Not on file  Social Connections: Not on file  Intimate Partner Violence: Not on file      Review of Systems  Constitutional:  Negative for chills, fatigue and unexpected weight change.  HENT:  Negative for congestion, rhinorrhea, sneezing and sore throat.   Eyes:  Negative for redness.  Respiratory:  Negative for cough, chest tightness and shortness of breath.   Cardiovascular:  Negative for chest pain and palpitations.  Gastrointestinal:  Negative for abdominal pain, constipation, diarrhea, nausea and vomiting.  Genitourinary:  Negative for dysuria and frequency.  Musculoskeletal:  Positive for arthralgias. Negative for back pain, joint swelling and neck pain.       Right foot drop  Skin:  Negative for rash.  Neurological: Negative.  Negative for tremors and numbness.  Hematological:  Negative for adenopathy. Does not bruise/bleed easily.  Psychiatric/Behavioral:  Negative for behavioral problems (Depression), sleep disturbance and suicidal ideas. The patient is not nervous/anxious.    Vital Signs: BP 114/76   Pulse 80   Temp 98.5 F (36.9 C)   Resp 16   Ht 6'  3" (1.905 m)   Wt 208 lb (94.3 kg)   SpO2 97%   BMI 26.00 kg/m    Physical Exam Vitals reviewed.  Constitutional:      General: He is not in acute distress.    Appearance: Normal appearance. He is obese. He is not ill-appearing.  HENT:     Head: Normocephalic and atraumatic.  Eyes:     Extraocular Movements: Extraocular movements intact.     Pupils: Pupils are equal, round, and reactive to light.  Cardiovascular:     Rate and Rhythm: Normal rate and regular rhythm.  Pulmonary:  Effort: Pulmonary effort is normal. No respiratory distress.  Neurological:     Mental Status: He is alert and oriented to person, place, and time.     Cranial Nerves: No cranial nerve deficit.     Coordination: Coordination normal.     Gait: Gait normal.  Psychiatric:        Mood and Affect: Mood normal.        Behavior: Behavior normal.       Assessment/Plan: 1. Uncontrolled type 2 diabetes mellitus with hyperglycemia (HCC) A1C is 10.9, refills ordered.  - POCT HgB A1C - glimepiride (AMARYL) 4 MG tablet; TAKE 1 TABLET(4 MG) BY MOUTH TWICE DAILY  Dispense: 180 tablet; Refill: 1 - dapagliflozin propanediol (FARXIGA) 10 MG TABS tablet; Take 1 tablet (10 mg total) by mouth daily before breakfast.  Dispense: 30 tablet; Refill: 2  2. Acquired right foot drop This problem is being managed by his podiatrist.  3. Benign prostatic hyperplasia with urinary frequency Stable, refill ordered - tamsulosin (FLOMAX) 0.4 MG CAPS capsule; Take 1 capsule (0.4 mg total) by mouth daily.  Dispense: 30 capsule; Refill: 3  4. Mixed hyperlipidemia Refills ordered. - ezetimibe (ZETIA) 10 MG tablet; Take 1 tablet (10 mg total) by mouth daily.  Dispense: 90 tablet; Refill: 3 - rosuvastatin (CRESTOR) 40 MG tablet; Take 1 tablet (40 mg total) by mouth daily.  Dispense: 90 tablet; Refill: 3   General Counseling: Michael Gross understanding of the findings of todays visit and agrees with plan of treatment. I  have discussed any further diagnostic evaluation that may be needed or ordered today. We also reviewed his medications today. he has been encouraged to call the office with any questions or concerns that should arise related to todays visit.    Orders Placed This Encounter  Procedures   POCT HgB A1C    Meds ordered this encounter  Medications   ezetimibe (ZETIA) 10 MG tablet    Sig: Take 1 tablet (10 mg total) by mouth daily.    Dispense:  90 tablet    Refill:  3   rosuvastatin (CRESTOR) 40 MG tablet    Sig: Take 1 tablet (40 mg total) by mouth daily.    Dispense:  90 tablet    Refill:  3   tamsulosin (FLOMAX) 0.4 MG CAPS capsule    Sig: Take 1 capsule (0.4 mg total) by mouth daily.    Dispense:  30 capsule    Refill:  3   glimepiride (AMARYL) 4 MG tablet    Sig: TAKE 1 TABLET(4 MG) BY MOUTH TWICE DAILY    Dispense:  180 tablet    Refill:  1   DISCONTD: Insulin Pen Needle (BD PEN NEEDLE NANO 2ND GEN) 32G X 4 MM MISC    Sig: Use as directed once a daily ELL.65    Dispense:  100 each    Refill:  3   dapagliflozin propanediol (FARXIGA) 10 MG TABS tablet    Sig: Take 1 tablet (10 mg total) by mouth daily before breakfast.    Dispense:  30 tablet    Refill:  2    Return in about 1 month (around 03/20/2021) for F/U, eval new med, Michael Gross PCP.   Total time spent: 20 Minutes Time spent includes review of chart, medications, test results, and follow up plan with the patient.   [place] Controlled Substance Database was reviewed by me.  This patient was seen by Jonetta Osgood, FNP-C in collaboration with Dr. Clayborn Bigness as a  part of collaborative care agreement.   Michael Leon R. Valetta Fuller, MSN, FNP-C Internal medicine

## 2021-02-24 ENCOUNTER — Telehealth: Payer: Self-pay

## 2021-02-28 ENCOUNTER — Telehealth: Payer: Self-pay

## 2021-02-28 NOTE — Telephone Encounter (Signed)
02/25/21 pt given samples for farxiga 10 mg.  Placed up front and pt came and picked them up

## 2021-03-01 NOTE — Telephone Encounter (Signed)
error 

## 2021-03-08 ENCOUNTER — Telehealth: Payer: Self-pay

## 2021-03-09 NOTE — Telephone Encounter (Signed)
error 

## 2021-03-24 ENCOUNTER — Ambulatory Visit (INDEPENDENT_AMBULATORY_CARE_PROVIDER_SITE_OTHER): Payer: Medicare Other | Admitting: Nurse Practitioner

## 2021-03-24 ENCOUNTER — Other Ambulatory Visit: Payer: Self-pay

## 2021-03-24 ENCOUNTER — Encounter: Payer: Self-pay | Admitting: Nurse Practitioner

## 2021-03-24 VITALS — BP 132/81 | HR 79 | Temp 98.2°F | Resp 16 | Ht 75.0 in | Wt 208.4 lb

## 2021-03-24 DIAGNOSIS — N401 Enlarged prostate with lower urinary tract symptoms: Secondary | ICD-10-CM | POA: Diagnosis not present

## 2021-03-24 DIAGNOSIS — R350 Frequency of micturition: Secondary | ICD-10-CM | POA: Diagnosis not present

## 2021-03-24 DIAGNOSIS — E1165 Type 2 diabetes mellitus with hyperglycemia: Secondary | ICD-10-CM | POA: Diagnosis not present

## 2021-03-24 DIAGNOSIS — I10 Essential (primary) hypertension: Secondary | ICD-10-CM

## 2021-03-24 MED ORDER — SITAGLIPTIN PHOSPHATE 100 MG PO TABS: 100.0000 mg | ORAL_TABLET | Freq: Every day | ORAL | 2 refills | Status: DC

## 2021-03-24 NOTE — Progress Notes (Signed)
[facility] [place],  [address]  Internal MEDICINE  Office Visit Note  Patient Name: Michael Gross  [id number]  [id number]  Date of Service: 03/24/2021  Chief Complaint  Patient presents with   Follow-up    Discuss meds    HPI Michael Gross presents for a follow up visit for diabetes and medication review. He was started on farxiga for his diabetes at his previous office visit.  Wilder Glade ended up being too expensive so he is interested in trying something different.  He is also asking for a list of different diabetic medications so he can call his insurance to find out which ones they cover and which ones are going to be more affordable for him.  He does have a glucose meter but he is not sure of which type of lancets and strips he needs.  He will call with this information at a later time.  He has an upcoming annual wellness visit. He takes tamsulosin for benign prostatic hyperplasia.  His blood pressure is well controlled on current medications.  Current Medication: Outpatient Encounter Medications as of 03/24/2021  Medication Sig   Ascorbic Acid (VITAMIN C) 100 MG tablet Take 100 mg by mouth daily.   ezetimibe (ZETIA) 10 MG tablet Take 1 tablet (10 mg total) by mouth daily.   glimepiride (AMARYL) 4 MG tablet TAKE 1 TABLET(4 MG) BY MOUTH TWICE DAILY   Lancets 30G MISC Use to check blood sugars 5 times per day   rosuvastatin (CRESTOR) 40 MG tablet Take 1 tablet (40 mg total) by mouth daily.   sitaGLIPtin (JANUVIA) 100 MG tablet Take 1 tablet (100 mg total) by mouth daily.   zinc gluconate 50 MG tablet Take 50 mg by mouth daily.   [DISCONTINUED] dapagliflozin propanediol (FARXIGA) 10 MG TABS tablet Take 1 tablet (10 mg total) by mouth daily before breakfast.   [DISCONTINUED] tamsulosin (FLOMAX) 0.4 MG CAPS capsule Take 1 capsule (0.4 mg total) by mouth daily. (Patient taking differently: Take 0.4 mg by mouth daily. Pt takes 2 capsules a day)    [DISCONTINUED] BAYER MICROLET LANCETS lancets Use as instructed to check blood sugar 2 times per day dx code E11.65 (Patient not taking: Reported on 03/24/2021)   [DISCONTINUED] glucose blood (BAYER CONTOUR NEXT TEST) test strip Use as instructed to check blood sugar 3 times per day at various different times. dx code E11.65 Bring meter to every visit. (Patient not taking: Reported on 03/24/2021)   No facility-administered encounter medications on file as of 03/24/2021.    Surgical History: Past Surgical History:  Procedure Laterality Date   HERNIA REPAIR     2003   [place]   MOHS SURGERY     2003    Medical History: Past Medical History:  Diagnosis Date   Basal cell carcinoma    BPH (benign prostatic hyperplasia)    Diabetes ([place])    Hyperlipidemia     Family History: Family History  Problem Relation Age of Onset   Heart disease Father    Prostate cancer Neg Hx    Kidney cancer Neg Hx    Bladder Cancer Neg Hx     Social History   Socioeconomic History   Marital status: Married    Spouse name: Not on file   Number of children: Not on file   Years of education: Not on file   Highest education level: Not on file  Occupational History   Not on file  Tobacco Use   Smoking status: Never   Smokeless tobacco: Never  Vaping Use   Vaping Use: Never used  Substance and Sexual Activity   Alcohol use: Yes    Comment: rare   Drug use: No   Sexual activity: Not on file  Other Topics Concern   Not on file  Social History Narrative   Not on file   Social Determinants of Health   Financial Resource Strain: Not on file  Food Insecurity: Not on file  Transportation Needs: Not on file  Physical Activity: Not on file  Stress: Not on file  Social Connections: Not on file  Intimate Partner Violence: Not on file      Review of Systems  Constitutional:  Negative for chills, fatigue and unexpected weight change.  HENT:  Negative for congestion,  rhinorrhea, sneezing and sore throat.   Eyes:  Negative for redness.  Respiratory:  Negative for cough, chest tightness and shortness of breath.   Cardiovascular:  Negative for chest pain and palpitations.  Gastrointestinal:  Negative for abdominal pain, constipation, diarrhea, nausea and vomiting.  Genitourinary:  Negative for dysuria and frequency.  Musculoskeletal:  Negative for arthralgias, back pain, joint swelling and neck pain.  Skin:  Negative for rash.  Neurological: Negative.  Negative for tremors and numbness.  Hematological:  Negative for adenopathy. Does not bruise/bleed easily.  Psychiatric/Behavioral:  Negative for behavioral problems (Depression), sleep disturbance and suicidal ideas. The patient is not nervous/anxious.    Vital Signs: BP 132/81   Pulse 79   Temp 98.2 F (36.8 C)   Resp 16   Ht 6\' 3"  (1.905 m)   Wt 208 lb 6.4 oz (94.5 kg)   SpO2 98%   BMI 26.05 kg/m    Physical Exam Vitals reviewed.  Constitutional:      General: He is not in acute distress.    Appearance: Normal appearance. He is obese. He is not ill-appearing.  HENT:     Head: Normocephalic and atraumatic.  Eyes:     Pupils: Pupils are equal, round, and reactive to light.  Cardiovascular:     Rate and Rhythm: Normal rate and regular rhythm.  Pulmonary:     Effort: Pulmonary effort is normal. No respiratory distress.  Neurological:     Mental Status: He is alert and oriented to person, place, and time.     Cranial Nerves: No cranial nerve deficit.     Coordination: Coordination normal.     Gait: Gait normal.  Psychiatric:        Mood and Affect: Mood normal.        Behavior: Behavior normal.       Assessment/Plan: 1. Uncontrolled type 2 diabetes mellitus with hyperglycemia ([place]) Since Wilder Glade is too expensive, this medication was discontinued.  Patient was given samples of Januvia and will try this medication for now since it is generally more affordable.  Other diabetic  medications were listed for him to discuss with his insurance including Ozempic, and Rybelsus.  Prescription for Januvia sent to his pharmacy. - sitaGLIPtin (JANUVIA) 100 MG tablet; Take 1 tablet (100 mg total) by mouth daily.  Dispense: 30 tablet; Refill: 2  2. Essential hypertension Blood pressure is well controlled and stable, continue current medications as prescribed.  3. Benign prostatic hyperplasia with urinary frequency BPH is controlled at this time, continue to take tamsulosin as prescribed.   General Counseling: mare ludtke understanding of the findings of todays visit and agrees with plan of treatment. I  have discussed any further diagnostic evaluation that may be needed or ordered today. We also reviewed his medications today. he has been encouraged to call the office with any questions or concerns that should arise related to todays visit.    No orders of the defined types were placed in this encounter.   Meds ordered this encounter  Medications   sitaGLIPtin (JANUVIA) 100 MG tablet    Sig: Take 1 tablet (100 mg total) by mouth daily.    Dispense:  30 tablet    Refill:  2    Return in about 2 months (around 05/24/2021) for F/U, Recheck A1C, Marshell Rieger PCP.   Total time spent:30 Minutes Time spent includes review of chart, medications, test results, and follow up plan with the patient.   [place] Controlled Substance Database was reviewed by me.  This patient was seen by Jonetta Osgood, FNP-C in collaboration with Dr. Clayborn Bigness as a part of collaborative care agreement.   Coury Grieger R. Valetta Fuller, MSN, FNP-C Internal medicine

## 2021-04-05 ENCOUNTER — Telehealth: Payer: Self-pay

## 2021-04-06 ENCOUNTER — Telehealth: Payer: Medicare Other | Admitting: Nurse Practitioner

## 2021-04-06 ENCOUNTER — Encounter: Payer: BC Managed Care – PPO | Admitting: Nurse Practitioner

## 2021-04-06 NOTE — Telephone Encounter (Signed)
Message sent to Michael Gross to make appt

## 2021-04-12 ENCOUNTER — Ambulatory Visit (INDEPENDENT_AMBULATORY_CARE_PROVIDER_SITE_OTHER): Payer: Medicare Other | Admitting: Nurse Practitioner

## 2021-04-12 ENCOUNTER — Encounter: Payer: Self-pay | Admitting: Nurse Practitioner

## 2021-04-12 ENCOUNTER — Other Ambulatory Visit: Payer: Self-pay

## 2021-04-12 VITALS — BP 117/74 | HR 70 | Temp 98.3°F | Resp 16 | Ht 75.0 in | Wt 207.6 lb

## 2021-04-12 DIAGNOSIS — E782 Mixed hyperlipidemia: Secondary | ICD-10-CM

## 2021-04-12 DIAGNOSIS — M79671 Pain in right foot: Secondary | ICD-10-CM

## 2021-04-12 DIAGNOSIS — Z1211 Encounter for screening for malignant neoplasm of colon: Secondary | ICD-10-CM

## 2021-04-12 DIAGNOSIS — M21371 Foot drop, right foot: Secondary | ICD-10-CM

## 2021-04-12 DIAGNOSIS — R350 Frequency of micturition: Secondary | ICD-10-CM

## 2021-04-12 DIAGNOSIS — E1165 Type 2 diabetes mellitus with hyperglycemia: Secondary | ICD-10-CM | POA: Diagnosis not present

## 2021-04-12 DIAGNOSIS — Z1212 Encounter for screening for malignant neoplasm of rectum: Secondary | ICD-10-CM

## 2021-04-12 DIAGNOSIS — Z23 Encounter for immunization: Secondary | ICD-10-CM

## 2021-04-12 DIAGNOSIS — Z0001 Encounter for general adult medical examination with abnormal findings: Secondary | ICD-10-CM | POA: Diagnosis not present

## 2021-04-12 DIAGNOSIS — I10 Essential (primary) hypertension: Secondary | ICD-10-CM

## 2021-04-12 DIAGNOSIS — N401 Enlarged prostate with lower urinary tract symptoms: Secondary | ICD-10-CM

## 2021-04-12 MED ORDER — GABAPENTIN 300 MG PO CAPS: ORAL_CAPSULE | ORAL | 2 refills | Status: DC

## 2021-04-12 MED ORDER — SEMAGLUTIDE(0.25 OR 0.5MG/DOS) 2 MG/1.5ML ~~LOC~~ SOPN: 0.2500 mg | PEN_INJECTOR | SUBCUTANEOUS | 1 refills | Status: DC

## 2021-04-12 MED ORDER — ZOSTER VAC RECOMB ADJUVANTED 50 MCG/0.5ML IM SUSR: 0.5000 mL | Freq: Once | INTRAMUSCULAR | 0 refills | Status: AC

## 2021-04-12 MED ORDER — TAMSULOSIN HCL 0.4 MG PO CAPS: 0.8000 mg | ORAL_CAPSULE | Freq: Every day | ORAL | 1 refills | Status: DC

## 2021-04-12 NOTE — Progress Notes (Signed)
[facility], [place] [address]  Internal MEDICINE  Office Visit Note  Patient Name: Michael Gross  [id number]  [id number]  Date of Service: 04/12/2021  Chief Complaint  Patient presents with   Annual Exam    Discuss meds, right foot pain   Diabetes   Hyperlipidemia    HPI Michael Gross presents for an annual well visit and physical exam.  He is a well-appearing 65 year old male with diabetes and hyperlipidemia.  During the summertime this year patient retired and switched over to [employer] which caused a delay in coverage and he was without medications for a couple months.  His A1c did go up in October and he is due to have his A1c repeated in January.  There has been some issues with finding medications that his insurance will cover, patient is changing up his Medicare insurance in January and found that they will cover several medications.  After discussing options with patient, he plans to start [place] in January.  He will also have his labs done in 2 months. His right foot is still bothering him.  Several months ago he had a traumatic tear in the ligament in his right foot which caused him pain and foot drop.  He has been seeing a podiatrist and there is a possibility he may need surgery but he is not having surgery yet. He is also due for routine colonoscopy and wants to get his shingles vaccine. His diabetic foot exam was done by podiatry.    Current Medication: Outpatient Encounter Medications as of 04/12/2021  Medication Sig   Ascorbic Acid (VITAMIN C) 100 MG tablet Take 100 mg by mouth daily.   ezetimibe (ZETIA) 10 MG tablet Take 1 tablet (10 mg total) by mouth daily.   gabapentin (NEURONTIN) 300 MG capsule Take one tab po qhs for spasm   glimepiride (AMARYL) 4 MG tablet TAKE 1 TABLET(4 MG) BY MOUTH TWICE DAILY   Lancets 30G MISC Use to check blood sugars 5 times per day   rosuvastatin (CRESTOR) 40 MG tablet Take 1 tablet (40 mg total) by  mouth daily.   [START ON 05/10/2021] Semaglutide,0.25 or 0.5MG /DOS, 2 MG/1.5ML SOPN Inject 0.25 mg into the skin once a week.   sitaGLIPtin (JANUVIA) 100 MG tablet Take 1 tablet (100 mg total) by mouth daily.   zinc gluconate 50 MG tablet Take 50 mg by mouth daily.   [DISCONTINUED] tamsulosin (FLOMAX) 0.4 MG CAPS capsule Take 1 capsule (0.4 mg total) by mouth daily. (Patient taking differently: Take 0.4 mg by mouth daily. Pt takes 2 capsules a day)   [DISCONTINUED] Zoster Vaccine Adjuvanted [facility]) injection Inject 0.5 mLs into the muscle once.   tamsulosin (FLOMAX) 0.4 MG CAPS capsule Take 2 capsules (0.8 mg total) by mouth daily. Pt takes 2 capsules a day   [EXPIRED] Zoster Vaccine Adjuvanted [facility]) injection Inject 0.5 mLs into the muscle once for 1 dose.   No facility-administered encounter medications on file as of 04/12/2021.    Surgical History: Past Surgical History:  Procedure Laterality Date   HERNIA REPAIR     2003   [place]   MOHS SURGERY     2003    Medical History: Past Medical History:  Diagnosis Date   Basal cell carcinoma    BPH (benign prostatic hyperplasia)    Diabetes ([place])    Hyperlipidemia     Family History: Family History  Problem Relation Age of Onset  Heart disease Father    Prostate cancer Neg Hx    Kidney cancer Neg Hx    Bladder Cancer Neg Hx     Social History   Socioeconomic History   Marital status: Married    Spouse name: Not on file   Number of children: Not on file   Years of education: Not on file   Highest education level: Not on file  Occupational History   Not on file  Tobacco Use   Smoking status: Never   Smokeless tobacco: Never  Vaping Use   Vaping Use: Never used  Substance and Sexual Activity   Alcohol use: Yes    Comment: rare   Drug use: No   Sexual activity: Not on file  Other Topics Concern   Not on file  Social History Narrative   Not on file   Social Determinants of Health    Financial Resource Strain: Not on file  Food Insecurity: Not on file  Transportation Needs: Not on file  Physical Activity: Not on file  Stress: Not on file  Social Connections: Not on file  Intimate Partner Violence: Not on file      Review of Systems  Constitutional:  Negative for activity change, appetite change, chills, fatigue, fever and unexpected weight change.  HENT: Negative.  Negative for congestion, ear pain, rhinorrhea, sore throat and trouble swallowing.   Eyes: Negative.   Respiratory: Negative.  Negative for cough, chest tightness, shortness of breath and wheezing.   Cardiovascular: Negative.  Negative for chest pain.  Gastrointestinal: Negative.  Negative for abdominal pain, blood in stool, constipation, diarrhea, nausea and vomiting.  Endocrine: Negative.   Genitourinary: Negative.  Negative for difficulty urinating, dysuria, frequency, hematuria and urgency.  Musculoskeletal:  Positive for arthralgias and gait problem. Negative for back pain, joint swelling, myalgias and neck pain.  Skin: Negative.  Negative for rash and wound.  Allergic/Immunologic: Negative.  Negative for immunocompromised state.  Neurological:  Negative for dizziness, seizures, numbness and headaches.  Hematological: Negative.   Psychiatric/Behavioral: Negative.  Negative for behavioral problems, self-injury and suicidal ideas. The patient is not nervous/anxious.    Vital Signs: BP 117/74   Pulse 70   Temp 98.3 F (36.8 C)   Resp 16   Ht 6\' 3"  (1.905 m)   Wt 207 lb 9.6 oz (94.2 kg)   SpO2 98%   BMI 25.95 kg/m    Physical Exam Vitals reviewed.  Constitutional:      General: He is not in acute distress.    Appearance: Normal appearance. He is well-developed. He is obese. He is not ill-appearing or diaphoretic.  HENT:     Head: Normocephalic and atraumatic.     Right Ear: Tympanic membrane, ear canal and external ear normal.     Left Ear: Tympanic membrane, ear canal and external  ear normal.     Nose: Nose normal. No congestion or rhinorrhea.     Mouth/Throat:     Mouth: Mucous membranes are moist.     Pharynx: Oropharynx is clear. No oropharyngeal exudate or posterior oropharyngeal erythema.  Eyes:     General: No scleral icterus.       Right eye: No discharge.        Left eye: No discharge.     Extraocular Movements: Extraocular movements intact.     Conjunctiva/sclera: Conjunctivae normal.     Pupils: Pupils are equal, round, and reactive to light.  Neck:     Thyroid: No thyromegaly.  Vascular: No JVD.     Trachea: No tracheal deviation.  Cardiovascular:     Rate and Rhythm: Normal rate and regular rhythm.     Pulses: Normal pulses.          Dorsalis pedis pulses are 2+ on the right side and 2+ on the left side.       Posterior tibial pulses are 2+ on the right side and 2+ on the left side.     Heart sounds: Normal heart sounds. No murmur heard.   No friction rub. No gallop.  Pulmonary:     Effort: Pulmonary effort is normal. No respiratory distress.     Breath sounds: Normal breath sounds. No stridor. No wheezing or rales.  Chest:     Chest wall: No tenderness.  Abdominal:     General: Bowel sounds are normal. There is no distension.     Palpations: Abdomen is soft. There is no mass.     Tenderness: There is no abdominal tenderness. There is no guarding or rebound.  Musculoskeletal:        General: No tenderness or deformity. Normal range of motion.     Cervical back: Normal range of motion and neck supple.     Right foot: Normal range of motion. Bunion present. No deformity.     Left foot: Bunion present.  Feet:     Right foot:     Protective Sensation: 6 sites tested.  6 sites sensed.     Skin integrity: Callus and dry skin present.     Left foot:     Protective Sensation: 6 sites tested.  6 sites sensed.     Skin integrity: Callus and dry skin present.  Lymphadenopathy:     Cervical: No cervical adenopathy.  Skin:    General: Skin is  warm and dry.     Capillary Refill: Capillary refill takes less than 2 seconds.     Coloration: Skin is not pale.     Findings: No erythema or rash.  Neurological:     Mental Status: He is alert and oriented to person, place, and time.     Cranial Nerves: No cranial nerve deficit.     Motor: No abnormal muscle tone.     Coordination: Coordination normal.     Gait: Gait normal.     Deep Tendon Reflexes: Reflexes are normal and symmetric.  Psychiatric:        Mood and Affect: Mood normal.        Behavior: Behavior normal.        Thought Content: Thought content normal.        Judgment: Judgment normal.       Assessment/Plan: 1. Encounter for general adult medical examination with abnormal findings Age-appropriate preventive screenings and vaccinations discussed, annual physical exam completed. Routine labs for health maintenance will be done at a later date. PHM updated.   2. Uncontrolled type 2 diabetes mellitus with hyperglycemia [facility]) Patient to start [place] in January, prescription sent to pharmacy.  We will follow-up in 2 months with A1c. - Semaglutide,0.25 or 0.5MG /DOS, 2 MG/1.5ML SOPN; Inject 0.25 mg into the skin once a week.  Dispense: 3 mL; Refill: 1  3. Right foot pain Right foot pain has been getting worse especially at night, gabapentin prescribed. - gabapentin (NEURONTIN) 300 MG capsule; Take one tab po qhs for spasm  Dispense: 30 capsule; Refill: 2  4. Essential hypertension His blood pressure is stable and patient is not on any medications  to control his blood pressure at this time.  5. Mixed hyperlipidemia Patient is on high-dose statin therapy with rosuvastatin 40 mg daily and is also on ezetimibe 10 mg daily  6. Benign prostatic hyperplasia with urinary frequency Stable, refills ordered - tamsulosin (FLOMAX) 0.4 MG CAPS capsule; Take 2 capsules (0.8 mg total) by mouth daily. Pt takes 2 capsules a day  Dispense: 180 capsule; Refill: 1  7. Acquired right  foot drop Followed by podiatry  8. Screening for colorectal cancer Patient is due for routine colonoscopy - Ambulatory referral to Gastroenterology  9. Need for vaccination - Zoster Vaccine Adjuvanted [facility]) injection; Inject 0.5 mLs into the muscle once for 1 dose.  Dispense: 0.5 mL; Refill: 0     General Counseling: kaci freel understanding of the findings of todays visit and agrees with plan of treatment. I have discussed any further diagnostic evaluation that may be needed or ordered today. We also reviewed his medications today. he has been encouraged to call the office with any questions or concerns that should arise related to todays visit.    Orders Placed This Encounter  Procedures   Ambulatory referral to Gastroenterology    Meds ordered this encounter  Medications   Zoster Vaccine Adjuvanted [facility]) injection    Sig: Inject 0.5 mLs into the muscle once for 1 dose.    Dispense:  0.5 mL    Refill:  0   tamsulosin (FLOMAX) 0.4 MG CAPS capsule    Sig: Take 2 capsules (0.8 mg total) by mouth daily. Pt takes 2 capsules a day    Dispense:  180 capsule    Refill:  1   Semaglutide,0.25 or 0.5MG /DOS, 2 MG/1.5ML SOPN    Sig: Inject 0.25 mg into the skin once a week.    Dispense:  3 mL    Refill:  1    Do not fill this medication until January, patient is starting on new insurance and the medication will not be covered until January.   gabapentin (NEURONTIN) 300 MG capsule    Sig: Take one tab po qhs for spasm    Dispense:  30 capsule    Refill:  2    Return in about 2 months (around 06/13/2021) for F/U, Denisa Enterline PCP.   Total time spent:30 Minutes Time spent includes review of chart, medications, test results, and follow up plan with the patient.   [place] Controlled Substance Database was reviewed by me.  This patient was seen by Jonetta Osgood, FNP-C in collaboration with Dr. Clayborn Bigness as a part of collaborative care agreement.  Shamon Lobo R. Valetta Fuller, MSN,  FNP-C Internal medicine

## 2021-04-14 ENCOUNTER — Other Ambulatory Visit: Payer: Self-pay | Admitting: Nurse Practitioner

## 2021-04-14 ENCOUNTER — Telehealth: Payer: Self-pay

## 2021-04-14 DIAGNOSIS — N401 Enlarged prostate with lower urinary tract symptoms: Secondary | ICD-10-CM

## 2021-04-14 NOTE — Telephone Encounter (Signed)
Patient came in today regarding billing from our office. Patient previous had [employer] before retirement of July 2022. Lovena Le was unable to remove insurance due to a claim still being out with that coverage. While checking patient in Lovena Le did collect [employer] information but did not correctly attach this to his October date of service. [employer] received the claim and has denied this, leaving a $1695.00 bill. Patient came in today upset regarding matter. He states if this is not fixed by the end of December 2022 and they deny the claim through [facility], [facility] will be 100% responsible for the cost. I spoke with patient to let him know this can be resent under the correct insurance and they should honor the dates regarding the active coverage. Also advised patient this was done as a honest mistake and not done in a act to hurt anyone or cause financial damage. Patient was provided with [facility]'s billing number and Abby also messaged her team to make them aware and to see if we can get this claim back out the door under the correct policy.

## 2021-04-21 ENCOUNTER — Telehealth: Payer: Self-pay

## 2021-04-21 NOTE — Telephone Encounter (Signed)
WANTS TO CALL us BACK TO SCHEDULE WHEN HE GETS INSURANCE FIGURED OUT

## 2021-05-07 ENCOUNTER — Encounter: Payer: Self-pay | Admitting: Nurse Practitioner

## 2021-05-13 ENCOUNTER — Other Ambulatory Visit: Payer: Self-pay

## 2021-05-13 DIAGNOSIS — E1165 Type 2 diabetes mellitus with hyperglycemia: Secondary | ICD-10-CM

## 2021-05-19 ENCOUNTER — Ambulatory Visit: Payer: Medicare Other | Admitting: Nurse Practitioner

## 2021-05-23 ENCOUNTER — Ambulatory Visit: Payer: Medicare Other

## 2021-05-23 ENCOUNTER — Other Ambulatory Visit: Payer: Self-pay

## 2021-05-23 DIAGNOSIS — M21371 Foot drop, right foot: Secondary | ICD-10-CM

## 2021-05-23 DIAGNOSIS — S86219A Strain of muscle(s) and tendon(s) of anterior muscle group at lower leg level, unspecified leg, initial encounter: Secondary | ICD-10-CM

## 2021-05-23 NOTE — Progress Notes (Addendum)
SITUATION Patient Name:  Michael Gross MRN:   [id number] Reason for Visit: Evaluation for Prefab AFO  Patient Report: Chief Complaint:   Foot Drop Petra Kuba of Discomfort/Pain:  Ambulatory Standing Location:    right lower extremity Onset & Duration:   Started with accident Course:    stable Aggravating or Alleviating Factors: Ambulation  OBJECTIVE DATA & MEASUREMENTS Prognosis:    Good Duration of use:   5 years  Diagnosis:   ICD-10-CM   1. Right foot drop  M21.371     2. Traumatic tear of tendon of tibialis anterior  S86.219A       GOALS, NECESSITIES, & JUSTIFICATIONS Recommended Device: [place] Walk-On Flex Right Large  Laterality HCPCS Code Description Justification  right L1951 AFO Spiral Type Prefabricated Necessary to prefent drop foot    I certify that Michael Gross qualifies for and will benefit from an ankle foot orthosis used during ambulation based on meeting all of the following criteria;   The patient is: - Ambulatory, and - Has weakness or deformity of the foot and ankle, and - Requires stabilization for medical reasons, and - Has the potential to benefit functionally  The patients medical record contains sufficient documentation of the patients medical condition to substantiate the necessity for the type and quantity of the items ordered.  The goals of this therapy: - Improve Mobility - Improve Lower Extremity Stability - Decrease Pain - Facilitate Soft Tissue Healing - Facilitate Immobilization, healing and treatment of an injury  I hereby certify that the ankle foot orthotic described above is a rigid or semi-rigid device which is used for the purpose of supporting a weak or deformed body member or restricting or eliminating motion in a diseased or injured part of the body. It is designed to provide support and counterforce on the limb or body part that is being braced. In my opinion, the custom molded ankle foot orthosis is both reasonable  and necessary in reference to accepted standards of medical practice in the treatment  of the patient condition and rehabilitation.  ACTIONS PERFORMED Patient was evaluated and measured for Spiral AFO via brannock device. Procedure was explained to patient. Patient tolerated procedure.   PLAN Patient to return in four to six weeks for fitting and delivery of device. Plan of care was explained to and agreed upon by patient. All questions were answered and concerns addressed.

## 2021-05-25 ENCOUNTER — Telehealth: Payer: Self-pay

## 2021-05-25 ENCOUNTER — Ambulatory Visit (INDEPENDENT_AMBULATORY_CARE_PROVIDER_SITE_OTHER): Payer: Medicare Other | Admitting: Podiatry

## 2021-05-25 ENCOUNTER — Other Ambulatory Visit: Payer: Self-pay

## 2021-05-25 ENCOUNTER — Encounter: Payer: Self-pay | Admitting: Podiatry

## 2021-05-25 DIAGNOSIS — M2042 Other hammer toe(s) (acquired), left foot: Secondary | ICD-10-CM

## 2021-05-25 DIAGNOSIS — E1142 Type 2 diabetes mellitus with diabetic polyneuropathy: Secondary | ICD-10-CM | POA: Diagnosis not present

## 2021-05-25 DIAGNOSIS — M2041 Other hammer toe(s) (acquired), right foot: Secondary | ICD-10-CM | POA: Diagnosis not present

## 2021-05-25 DIAGNOSIS — M778 Other enthesopathies, not elsewhere classified: Secondary | ICD-10-CM

## 2021-05-25 DIAGNOSIS — M21371 Foot drop, right foot: Secondary | ICD-10-CM | POA: Diagnosis not present

## 2021-05-25 MED ORDER — TRIAMCINOLONE ACETONIDE 40 MG/ML IJ SUSP
20.0000 mg | Freq: Once | INTRAMUSCULAR | Status: AC
  Administered 2021-05-25: 20 mg

## 2021-05-25 NOTE — Telephone Encounter (Signed)
Pt notified that we change med and keep glucose log

## 2021-05-25 NOTE — Progress Notes (Signed)
He presents today for follow-up of his right dropfoot but also for diabetic checkup.  Objective: Vital signs are stable alert and oriented x3.  Pulses are palpable.  There is no erythema edema cellulitis drainage or odor he still does not have any use of the tibialis anterior muscle as opposed to all of the other muscles on dorsiflexion plantarflexion inverters and everters are all intact.  Neurologic sensorium is diminished per Semmes Weinstein monofilament and hammertoe deformities are noted and flexible bilateral.  No open lesions or wounds are noted.  Assessment: Pain in limb secondary to diabetic peripheral neuropathy hammertoe deformity and dropfoot deformity on the right side.  Plan: Cast for a AFO and he needs to be seen for diabetic shoes to be molded as well diabetic shoes will help prevent any skin breakdown secondary to the diminished sensation bilateral.  On her follow-up with him in a few months he will follow-up with Aaron Edelman in the near future for diabetic shoe cast and to make sure that his brace was casted as well.

## 2021-05-26 ENCOUNTER — Telehealth: Payer: Self-pay | Admitting: Podiatry

## 2021-05-26 ENCOUNTER — Other Ambulatory Visit: Payer: Self-pay

## 2021-05-26 MED ORDER — EMPAGLIFLOZIN 25 MG PO TABS: 25.0000 mg | ORAL_TABLET | Freq: Every day | ORAL | 2 refills | Status: DC

## 2021-05-26 NOTE — Telephone Encounter (Signed)
Per Stanton Kidney @ [facility] insurance the cpt code [phone] is valid and billable and no authorization is needed. Covered @ 80%. Pt has 20% coinsurance.. reference # [id number].Marland Kitchen

## 2021-05-30 ENCOUNTER — Ambulatory Visit: Payer: Medicare Other | Admitting: Dermatology

## 2021-05-30 ENCOUNTER — Other Ambulatory Visit: Payer: Self-pay

## 2021-05-30 ENCOUNTER — Encounter: Payer: Self-pay | Admitting: Dermatology

## 2021-05-30 DIAGNOSIS — L578 Other skin changes due to chronic exposure to nonionizing radiation: Secondary | ICD-10-CM

## 2021-05-30 DIAGNOSIS — L570 Actinic keratosis: Secondary | ICD-10-CM

## 2021-05-30 DIAGNOSIS — L820 Inflamed seborrheic keratosis: Secondary | ICD-10-CM

## 2021-05-30 NOTE — Patient Instructions (Signed)
Cryotherapy Aftercare  Wash gently with soap and water everyday.   Apply Vaseline and Band-Aid daily until healed.   Prior to procedure, discussed risks of blister formation, small wound, skin dyspigmentation, or rare scar following cryotherapy. Recommend Vaseline ointment to treated areas while healing.   If You Need Anything After Your Visit  If you have any questions or concerns for your doctor, please call our main line at [phone] and press option 4 to reach your doctor's medical assistant. If no one answers, please leave a voicemail as directed and we will return your call as soon as possible. Messages left after 4 pm will be answered the following business day.   You may also send [place] a message via [place]. We typically respond to MyChart messages within 1-2 business days.  For prescription refills, please ask your pharmacy to contact our office. Our fax number is [phone].  If you have an urgent issue when the clinic is closed that cannot wait until the next business day, you can page your doctor at the number below.    Please note that while we do our best to be available for urgent issues outside of office hours, we are not available 24/7.   If you have an urgent issue and are unable to reach [place], you may choose to seek medical care at your doctor's office, retail clinic, urgent care center, or emergency room.  If you have a medical emergency, please immediately call 911 or go to the emergency department.  Pager Numbers  - Dr. Nehemiah Massed: [phone]  - Dr. Laurence Ferrari: [phone]  - Dr. Nicole Kindred: [phone]  In the event of inclement weather, please call our main line at [phone] for an update on the status of any delays or closures.  Dermatology Medication Tips: Please keep the boxes that topical medications come in in order to help keep track of the instructions about where and how to use these. Pharmacies typically print the medication instructions only on the  boxes and not directly on the medication tubes.   If your medication is too expensive, please contact our office at [phone] option 4 or send [place] a message through [place].   We are unable to tell what your co-pay for medications will be in advance as this is different depending on your insurance coverage. However, we may be able to find a substitute medication at lower cost or fill out paperwork to get insurance to cover a needed medication.   If a prior authorization is required to get your medication covered by your insurance company, please allow [place] 1-2 business days to complete this process.  Drug prices often vary depending on where the prescription is filled and some pharmacies may offer cheaper prices.  The website www.goodrx.com contains coupons for medications through different pharmacies. The prices here do not account for what the cost may be with help from insurance (it may be cheaper with your insurance), but the website can give you the price if you did not use any insurance.  - You can print the associated coupon and take it with your prescription to the pharmacy.  - You may also stop by our office during regular business hours and pick up a GoodRx coupon card.  - If you need your prescription sent electronically to a different pharmacy, notify our office through [facility] or by phone at [phone] option 4.     Si Usted Necesita Algo Despus de Su Visita  Tambin puede enviarnos un mensaje a Lawerance Cruel  de MyChart. Por lo general respondemos a los mensajes de MyChart en el transcurso de 1 a 2 das hbiles.  Para renovar recetas, por favor pida a su farmacia que se ponga en contacto con nuestra oficina. Harland Dingwall de fax es [place] [phone].  Si tiene un asunto urgente cuando la clnica est cerrada y que no puede esperar hasta el siguiente da hbil, puede llamar/localizar a su doctor(a) al nmero que aparece a continuacin.   Por favor, tenga en cuenta que  aunque hacemos todo lo posible para estar disponibles para asuntos urgentes fuera del horario de [place], no estamos disponibles las 24 horas del da, los 7 das de la [place].   Si tiene un problema urgente y no puede comunicarse con nosotros, puede optar por buscar atencin mdica  en el consultorio de su doctor(a), en una clnica privada, en un centro de atencin urgente o en una sala de emergencias.  Si tiene Engineering geologist, por favor llame inmediatamente al 911 o vaya a la sala de emergencias.  Nmeros de bper  - Dr. Nehemiah Massed: [phone]  - Dra. Moye: [phone]  - Dra. Nicole Kindred: [phone]  En caso de inclemencias del [place], por favor llame a Johnsie Kindred principal al [phone] para una actualizacin sobre el [place] de cualquier retraso o cierre.  Consejos para la medicacin en dermatologa: Por favor, guarde las cajas en las que vienen los medicamentos de uso tpico para ayudarle a seguir las instrucciones sobre dnde y cmo usarlos. Las farmacias generalmente imprimen las instrucciones del medicamento slo en las cajas y no directamente en los tubos del [place].   Si su medicamento es muy caro, por favor, pngase en contacto con Zigmund Daniel llamando al [phone] y presione la opcin 4 o envenos un mensaje a travs de Pharmacist, community.   No podemos decirle cul ser su copago por los medicamentos por adelantado ya que esto es diferente dependiendo de la cobertura de su seguro. Sin embargo, es posible que podamos encontrar un medicamento sustituto a Electrical engineer un formulario para que el seguro cubra el medicamento que se considera necesario.   Si se requiere una autorizacin previa para que su compaa de seguros [place] su medicamento, por favor permtanos de 1 a 2 das hbiles para completar este proceso.  Los precios de los medicamentos varan con frecuencia dependiendo del Environmental consultant de dnde se surte la receta y alguna farmacias pueden ofrecer precios ms  baratos.  El sitio web www.goodrx.com tiene cupones para medicamentos de Airline pilot. Los precios aqu no tienen en cuenta lo que podra costar con la ayuda del seguro (puede ser ms barato con su seguro), pero el sitio web puede darle el precio si no utiliz Research scientist (physical sciences).  - Puede imprimir el cupn correspondiente y llevarlo con su receta a la farmacia.  - Tambin puede pasar por nuestra oficina durante el horario de atencin regular y Charity fundraiser una tarjeta de cupones de GoodRx.  - Si necesita que su receta se enve electrnicamente a una farmacia diferente, informe a nuestra oficina a travs de MyChart de [place] o por telfono llamando al [phone] y presione la opcin 4.

## 2021-05-30 NOTE — Progress Notes (Signed)
Follow-Up Visit   Subjective  Michael Gross is a 66 y.o. male who presents for the following: AK recheck, ISK recheck (3 month recheck. Face/neck. Hx of AK's and ISK's Tx with LN2 at last visit. Has area in scalp would like Tx today. Bothersome, irritated, hits with comb). The patient has spots, moles and lesions to be evaluated, some may be new or changing and the patient has concerns that these could be cancer.  Wife with patient.   The following portions of the chart were reviewed this encounter and updated as appropriate:  Tobacco   Allergies   Meds   Problems   Med Hx   Surg Hx   Fam Hx      Review of Systems: No other skin or systemic complaints except as noted in HPI or Assessment and Plan.  Objective  Well appearing patient in no apparent distress; mood and affect are within normal limits.  A focused examination was performed including face, neck, scalp. Relevant physical exam findings are noted in the Assessment and Plan.  left forehead x1 Erythematous thin papules/macules with gritty scale.   Left Temple x1,  left sideburn x1 (2) Erythematous keratotic or waxy stuck-on papule or plaque.   Assessment & Plan  AK (actinic keratosis) left forehead x1  Actinic keratoses are precancerous spots that appear secondary to cumulative UV radiation exposure/sun exposure over time. They are chronic with expected duration over 1 year. A portion of actinic keratoses will progress to squamous cell carcinoma of the skin. It is not possible to reliably predict which spots will progress to skin cancer and so treatment is recommended to prevent development of skin cancer.  Recommend daily broad spectrum sunscreen SPF 30+ to sun-exposed areas, reapply every 2 hours as needed.  Recommend staying in the shade or wearing long sleeves, sun glasses (UVA+UVB protection) and wide brim hats (4-inch brim around the entire circumference of the hat). Call for new or changing lesions.  Destruction  of lesion - left forehead x1 Complexity: simple   Destruction method: cryotherapy   Informed consent: discussed and consent obtained   Timeout:  patient name, date of birth, surgical site, and procedure verified Lesion destroyed using liquid nitrogen: Yes   Region frozen until ice ball extended beyond lesion: Yes   Outcome: patient tolerated procedure well with no complications   Post-procedure details: wound care instructions given    Inflamed seborrheic keratosis (2) Left Temple x1,  left sideburn x1  Destruction of lesion - Left Temple x1,  left sideburn x1 Complexity: simple   Destruction method: cryotherapy   Informed consent: discussed and consent obtained   Timeout:  patient name, date of birth, surgical site, and procedure verified Lesion destroyed using liquid nitrogen: Yes   Region frozen until ice ball extended beyond lesion: Yes   Outcome: patient tolerated procedure well with no complications   Post-procedure details: wound care instructions given    Actinic Damage - chronic, secondary to cumulative UV radiation exposure/sun exposure over time - diffuse scaly erythematous macules with underlying dyspigmentation - Recommend daily broad spectrum sunscreen SPF 30+ to sun-exposed areas, reapply every 2 hours as needed.  - Recommend staying in the shade or wearing long sleeves, sun glasses (UVA+UVB protection) and wide brim hats (4-inch brim around the entire circumference of the hat). - Call for new or changing lesions.  Return in about 6 months (around 11/27/2021) for UBSE, AK Follow Up, ISK Follow Up.  I, Emelia Salisbury, CMA, am acting as  scribe for Sarina Ser, MD. Documentation: I have reviewed the above documentation for accuracy and completeness, and I agree with the above.  Sarina Ser, MD

## 2021-05-31 ENCOUNTER — Ambulatory Visit: Payer: Medicare Other

## 2021-05-31 ENCOUNTER — Telehealth: Payer: Self-pay

## 2021-05-31 DIAGNOSIS — M2041 Other hammer toe(s) (acquired), right foot: Secondary | ICD-10-CM

## 2021-05-31 DIAGNOSIS — M21371 Foot drop, right foot: Secondary | ICD-10-CM

## 2021-05-31 DIAGNOSIS — E1142 Type 2 diabetes mellitus with diabetic polyneuropathy: Secondary | ICD-10-CM

## 2021-05-31 NOTE — Progress Notes (Signed)
SITUATION Reason for Consult: Evaluation for Prefabricated Diabetic Shoes and Bilateral Custom Diabetic Inserts. Patient / Caregiver Report: Patient would like well fitting shoes  OBJECTIVE DATA: Patient History / Diagnosis:    ICD-10-CM   1. Diabetic polyneuropathy associated with type 2 diabetes mellitus (HCC)  E11.42     2. Hammer toes of both feet  M20.41    M20.42     3. Right foot drop  M21.371       Current or Previous Devices:   None and no history  In-Person Foot Examination: Ulcers & Callousing:   None  Toe / Foot Deformities:   Pes cavus, prominent met heads   Shoe Size: 11.5XW  ORTHOTIC RECOMMENDATION Recommended Devices: - 1x pair prefabricated PDAC approved diabetic shoes: [id number] 193 11.5W - 3x pair custom-to-patient vacuum formed diabetic insoles.   GOALS OF SHOES AND INSOLES - Reduce shear and pressure - Reduce / Prevent callus formation - Reduce / Prevent ulceration - Protect the fragile healing compromised diabetic foot.  Patient would benefit from diabetic shoes and inserts as patient has diabetes mellitus and the patient has one or more of the following conditions: - History of partial or complete amputation of the foot - History of previous foot ulceration. - History of pre-ulcerative callus - Peripheral neuropathy with evidence of callus formation - Foot deformity - Poor circulation  ACTIONS PERFORMED Patient was casted for insoles via crush box and measured for shoes via brannock device. Procedure was explained and patient tolerated procedure well. All questions were answered and concerns addressed.  PLAN Patient is to ensure treating physician receives and completes diabetic paperwork. Casts and shoe order are to be held until paperwork is received. Once received patient is to be scheduled for fitting in four weeks.

## 2021-05-31 NOTE — Telephone Encounter (Signed)
Patient brought in diabetic shoe order to be signed. Signed by Yetta Flock. Will give to dfk to sign as well-Toni

## 2021-05-31 NOTE — Progress Notes (Signed)
SITUATION: Reason for Visit: Fitting and Delivery of Prefab Carbon Pretibial AFO Patient Report: Patient reports comfort and is satisfied with device.  OBJECTIVE DATA: Patient History / Diagnosis:    ICD-10-CM   1. Diabetic polyneuropathy associated with type 2 diabetes mellitus (HCC)  E11.42     2. Hammer toes of both feet  M20.41    M20.42     3. Right foot drop  M21.371       Provided Device: [place] WalkOn Reaction Large Right  GOAL OF ORTHOSIS - Improve gait - Decrease energy expenditure - Improve Balance - Compensate for muscle weakness - Facilitate motion  ACTIONS PERFORMED Patient was fit with [place] WalkOn Reaction AFO trimmed to shoe last. Patient tolerated fittign procedure. Device was modified as follows to better fit patient: - Toe plate was trimmed to shoe last - Strap was trimmed to appropriate length  Patient was provided with verbal and written instruction and demonstration regarding donning, doffing, wear, care, proper fit, function, purpose, cleaning, and use of the orthosis and in all related precautions and risks and benefits regarding the orthosis.  Patient was also provided with verbal instruction regarding how to report any failures or malfunctions of the orthosis and necessary follow up care. Patient was also instructed to contact our office regarding any change in status that may affect the function of the orthosis.  Patient demonstrated independence with proper donning, doffing, and fit and verbalized understanding of all instructions.  PLAN: Patient is to follow up in one week or as necessary (PRN). All questions were answered and concerns addressed. Plan of care was discussed with and agreed upon by the patient.

## 2021-06-01 ENCOUNTER — Telehealth: Payer: Self-pay

## 2021-06-01 NOTE — Telephone Encounter (Signed)
Shoe ordered signed. Faxed to [facility] [phone]. Notified patient. Sent order to be scanned-Toni

## 2021-06-04 ENCOUNTER — Encounter: Payer: Self-pay | Admitting: Dermatology

## 2021-06-16 ENCOUNTER — Telehealth: Payer: Self-pay

## 2021-06-16 NOTE — Telephone Encounter (Signed)
Shoe ordered signed. Faxed to [facility] [phone].again and we get confirmation

## 2021-06-21 DIAGNOSIS — M25512 Pain in left shoulder: Secondary | ICD-10-CM | POA: Diagnosis not present

## 2021-06-21 DIAGNOSIS — S40012A Contusion of left shoulder, initial encounter: Secondary | ICD-10-CM | POA: Diagnosis not present

## 2021-06-30 DIAGNOSIS — E119 Type 2 diabetes mellitus without complications: Secondary | ICD-10-CM | POA: Diagnosis not present

## 2021-07-05 DIAGNOSIS — M25512 Pain in left shoulder: Secondary | ICD-10-CM | POA: Diagnosis not present

## 2021-07-06 ENCOUNTER — Ambulatory Visit (INDEPENDENT_AMBULATORY_CARE_PROVIDER_SITE_OTHER): Payer: Medicare Other | Admitting: Nurse Practitioner

## 2021-07-06 ENCOUNTER — Other Ambulatory Visit: Payer: Self-pay

## 2021-07-06 ENCOUNTER — Telehealth: Payer: Self-pay

## 2021-07-06 ENCOUNTER — Encounter: Payer: Self-pay | Admitting: Nurse Practitioner

## 2021-07-06 VITALS — BP 140/82 | HR 81 | Temp 98.3°F | Resp 16 | Ht 75.0 in | Wt 195.2 lb

## 2021-07-06 DIAGNOSIS — M79671 Pain in right foot: Secondary | ICD-10-CM | POA: Diagnosis not present

## 2021-07-06 DIAGNOSIS — E1165 Type 2 diabetes mellitus with hyperglycemia: Secondary | ICD-10-CM

## 2021-07-06 DIAGNOSIS — E1140 Type 2 diabetes mellitus with diabetic neuropathy, unspecified: Secondary | ICD-10-CM | POA: Diagnosis not present

## 2021-07-06 DIAGNOSIS — E782 Mixed hyperlipidemia: Secondary | ICD-10-CM

## 2021-07-06 DIAGNOSIS — I10 Essential (primary) hypertension: Secondary | ICD-10-CM | POA: Diagnosis not present

## 2021-07-06 LAB — POCT GLYCOSYLATED HEMOGLOBIN (HGB A1C): Hemoglobin A1C: 9.3 % — AB (ref 4.0–5.6)

## 2021-07-06 MED ORDER — SEMAGLUTIDE(0.25 OR 0.5MG/DOS) 2 MG/1.5ML ~~LOC~~ SOPN: 0.5000 mg | PEN_INJECTOR | SUBCUTANEOUS | 1 refills | Status: DC

## 2021-07-06 MED ORDER — EMPAGLIFLOZIN 25 MG PO TABS: 25.0000 mg | ORAL_TABLET | Freq: Every day | ORAL | 2 refills | Status: DC

## 2021-07-06 MED ORDER — GABAPENTIN 100 MG PO CAPS: ORAL_CAPSULE | ORAL | 2 refills | Status: DC

## 2021-07-06 MED ORDER — GABAPENTIN 300 MG PO CAPS: ORAL_CAPSULE | ORAL | 2 refills | Status: DC

## 2021-07-06 NOTE — Telephone Encounter (Signed)
Spoke with pt regarding diabetic shoes. Pt was upset that he has not gotten his shoes yet. I explained to the pt that the forms were sent out on 06/02/2021 and signed by a NP on 06/21/2021. I explained to the pt that when the fax came through I sent it back asking for an MD's signature. In the vendor system it says that they are invalid but I have spoken with Russell at [employer] to make sure that the form has been received and is ready to process. Pt stated that he is going out of town and wants to cancel the order if they are not here by Wednesday. Pt advised that I will call him when the shoes and inserts come in.

## 2021-07-06 NOTE — Progress Notes (Signed)
[facility] [place], [place] [address]  Internal MEDICINE  Office Visit Note  Patient Name: Michael Gross  [id number]  [id number]  Date of Service: 07/06/2021  Chief Complaint  Patient presents with   Follow-up    Hands are shaky, noticed it a couple months ago, pt tripped and fell hurting left shoulder about a month ago    Diabetes   Hyperlipidemia    HPI Kunta presents for follow-up visit for diabetes, hypertension, neuropathy, and hyperlipidemia.   --He was started on Ozempic in January.  He weighed 191 pounds this morning on his scale at home.  He has lost 16 pounds since his previous office visit in December.  Patient's glucose levels have been improving.  His A1c has improved from 10.9 in November to 9.3 today.  His current Ozempic dose is 0.25 mg weekly.  He is also taking Jardiance 25 mg daily and he takes glimepiride 4 mg once or twice a day depending on what his sugars are running. --Blood pressure is currently stable with medications. --He does still have significant neuropathy related to his diabetes but his right foot injury may be contributing as well.  He is currently on gabapentin 300 mg at bedtime for the neuropathy but it is also bothering him during the day. --He has been experiencing a lot of leg muscle cramps and foot cramps.  He is wanting to know what he can do or take to help improve or resolve the muscle cramps at night. --Patient reports that he noticed that his right hand is very shaky almost like a tremor.  He is worried that he might have Parkinson's disease. --He tripped and fell injuring his left shoulder about 1 month ago and he has been to his orthopedic specialist a couple of times since then.  He was having trouble sleeping on his left shoulder but they did a cortisone joint injection in the left shoulder and he reports that it has been feeling better. --Patient is aware that losing weight will help improve his cholesterol  levels as well as his glucose levels.  Tizanidine made him too drowsy Holding celebrex Taking meloxicam and gabapentin  Current Medication: Outpatient Encounter Medications as of 07/06/2021  Medication Sig   Ascorbic Acid (VITAMIN C) 100 MG tablet Take 100 mg by mouth daily.   ezetimibe (ZETIA) 10 MG tablet Take 1 tablet (10 mg total) by mouth daily.   gabapentin (NEURONTIN) 100 MG capsule Take 1 capsule by mouth every morning and early afternoon.   glimepiride (AMARYL) 4 MG tablet TAKE 1 TABLET(4 MG) BY MOUTH TWICE DAILY   Lancets 30G MISC Use to check blood sugars 5 times per day   rosuvastatin (CRESTOR) 40 MG tablet Take 1 tablet (40 mg total) by mouth daily.   tamsulosin (FLOMAX) 0.4 MG CAPS capsule Take 2 capsules (0.8 mg total) by mouth daily. Pt takes 2 capsules a day   zinc gluconate 50 MG tablet Take 50 mg by mouth daily.   [DISCONTINUED] empagliflozin (JARDIANCE) 25 MG TABS tablet Take 1 tablet (25 mg total) by mouth daily.   [DISCONTINUED] FARXIGA 10 MG TABS tablet Take 10 mg by mouth daily.   [DISCONTINUED] gabapentin (NEURONTIN) 300 MG capsule Take one tab po qhs for spasm   [DISCONTINUED] Semaglutide,0.25 or 0.5MG /DOS, 2 MG/1.5ML SOPN Inject 0.25 mg into the skin once a week.   [DISCONTINUED] sitaGLIPtin (JANUVIA) 100 MG tablet Take 1 tablet (100 mg total) by mouth daily.   empagliflozin (JARDIANCE)  25 MG TABS tablet Take 1 tablet (25 mg total) by mouth daily.   gabapentin (NEURONTIN) 300 MG capsule Take one tab po qhs for spasm   meloxicam (MOBIC) 15 MG tablet meloxicam 15 mg tablet  Take 1 tablet every day by oral route.   Semaglutide,0.25 or 0.5MG /DOS, 2 MG/1.5ML SOPN Inject 0.5 mg into the skin once a week.   tiZANidine (ZANAFLEX) 4 MG tablet Take 4 mg by mouth every 8 (eight) hours.   [DISCONTINUED] gabapentin (NEURONTIN) 300 MG capsule Take one tab po qhs for spasm   No facility-administered encounter medications on file as of 07/06/2021.    Surgical History: Past  Surgical History:  Procedure Laterality Date   HERNIA REPAIR     2003   [place]   MOHS SURGERY     2003    Medical History: Past Medical History:  Diagnosis Date   Basal cell carcinoma    BPH (benign prostatic hyperplasia)    Diabetes ([place])    Hyperlipidemia     Family History: Family History  Problem Relation Age of Onset   Heart disease Father    Prostate cancer Neg Hx    Kidney cancer Neg Hx    Bladder Cancer Neg Hx     Social History   Socioeconomic History   Marital status: Married    Spouse name: Not on file   Number of children: Not on file   Years of education: Not on file   Highest education level: Not on file  Occupational History   Not on file  Tobacco Use   Smoking status: Never   Smokeless tobacco: Never  Vaping Use   Vaping Use: Never used  Substance and Sexual Activity   Alcohol use: Yes    Comment: rare   Drug use: No   Sexual activity: Not on file  Other Topics Concern   Not on file  Social History Narrative   Not on file   Social Determinants of Health   Financial Resource Strain: Not on file  Food Insecurity: Not on file  Transportation Needs: Not on file  Physical Activity: Not on file  Stress: Not on file  Social Connections: Not on file  Intimate Partner Violence: Not on file      Review of Systems  Constitutional:  Negative for chills, fatigue and unexpected weight change.  HENT:  Negative for congestion, rhinorrhea, sneezing and sore throat.   Eyes:  Negative for redness.  Respiratory: Negative.  Negative for cough, chest tightness, shortness of breath and wheezing.   Cardiovascular: Negative.  Negative for chest pain and palpitations.  Gastrointestinal: Negative.  Negative for abdominal pain, constipation, diarrhea, nausea and vomiting.  Genitourinary:  Negative for dysuria and frequency.  Musculoskeletal:  Positive for arthralgias, back pain, joint swelling and myalgias. Negative for neck pain.  Skin:  Negative.  Negative for rash.  Neurological: Negative.  Negative for tremors and numbness.  Hematological:  Negative for adenopathy. Does not bruise/bleed easily.  Psychiatric/Behavioral:  Negative for behavioral problems (Depression), sleep disturbance and suicidal ideas. The patient is not nervous/anxious.    Vital Signs: BP 140/82    Pulse 81    Temp 98.3 F (36.8 C)    Resp 16    Ht 6\' 3"  (1.905 m)    Wt 195 lb 3.2 oz (88.5 kg)    SpO2 97%    BMI 24.40 kg/m    Physical Exam Vitals reviewed.  Constitutional:  General: He is not in acute distress.    Appearance: Normal appearance. He is normal weight. He is not ill-appearing.  HENT:     Head: Normocephalic and atraumatic.     Right Ear: Tympanic membrane normal.     Left Ear: Tympanic membrane normal.  Eyes:     Pupils: Pupils are equal, round, and reactive to light.  Cardiovascular:     Rate and Rhythm: Normal rate and regular rhythm.  Pulmonary:     Effort: Pulmonary effort is normal. No respiratory distress.  Neurological:     Mental Status: He is alert and oriented to person, place, and time.  Psychiatric:        Mood and Affect: Mood normal.        Behavior: Behavior normal.       Assessment/Plan: 1. Uncontrolled type 2 diabetes mellitus with hyperglycemia (HCC) A1c is improving and is 9.3 today.  Ozempic dose increased to 0.5 mg weekly.  Continue Jardiance, refill sent to pharmacy.  Stop Januvia.  Continue to take glimepiride as discussed 1-2 times a day depending on glucose levels.  Follow-up in 3 months for repeat A1c.  Will discuss increasing Ozempic dose to 1 mg if necessary at next office visit. - POCT HgB A1C - Semaglutide,0.25 or 0.5MG /DOS, 2 MG/1.5ML SOPN; Inject 0.5 mg into the skin once a week.  Dispense: 4.5 mL; Refill: 1 - empagliflozin (JARDIANCE) 25 MG TABS tablet; Take 1 tablet (25 mg total) by mouth daily.  Dispense: 30 tablet; Refill: 2  2. Neuropathy due to type 2 diabetes mellitus  (HCC) Low-dose gabapentin 100 mg prescribed for patient to take in the morning and early afternoon.  The prescription was written so he can take it up to 2 times a day but he can take it just in the morning if he does not need the midday dose.  Patient to continue 300 mg gabapentin dose at night.  If no improvement is seen with the additional daytime dosing of gabapentin, will discuss switching from gabapentin to Lyrica to see if that medication works better for him. - gabapentin (NEURONTIN) 100 MG capsule; Take 1 capsule by mouth every morning and early afternoon.  Dispense: 60 capsule; Refill: 2 - gabapentin (NEURONTIN) 300 MG capsule; Take one tab po qhs for spasm  Dispense: 30 capsule; Refill: 2  3. Essential hypertension Blood pressure stable on current medications.  4. Right foot pain Patient instructed to hold Celebrex while he is taking meloxicam which was prescribed for his shoulder injury a month ago.  Patient acknowledged understanding that taking Celebrex and meloxicam at the same time will increase his risk of a GI bleed.  5. Mixed hyperlipidemia Continue rosuvastatin 40 mg daily, will order labs to include a lipid panel at next office visit   General Counseling: haven foss understanding of the findings of todays visit and agrees with plan of treatment. I have discussed any further diagnostic evaluation that may be needed or ordered today. We also reviewed his medications today. he has been encouraged to call the office with any questions or concerns that should arise related to todays visit.    Orders Placed This Encounter  Procedures   POCT HgB A1C    Meds ordered this encounter  Medications   Semaglutide,0.25 or 0.5MG /DOS, 2 MG/1.5ML SOPN    Sig: Inject 0.5 mg into the skin once a week.    Dispense:  4.5 mL    Refill:  1   empagliflozin (JARDIANCE) 25 MG TABS  tablet    Sig: Take 1 tablet (25 mg total) by mouth daily.    Dispense:  30 tablet    Refill:  2    DISCONTD: gabapentin (NEURONTIN) 300 MG capsule    Sig: Take one tab po qhs for spasm    Dispense:  30 capsule    Refill:  2   gabapentin (NEURONTIN) 100 MG capsule    Sig: Take 1 capsule by mouth every morning and early afternoon.    Dispense:  60 capsule    Refill:  2   gabapentin (NEURONTIN) 300 MG capsule    Sig: Take one tab po qhs for spasm    Dispense:  30 capsule    Refill:  2    Return in about 3 months (around 10/06/2021) for F/U, Recheck A1C, Charnele Semple PCP.   Total time spent:30 Minutes Time spent includes review of chart, medications, test results, and follow up plan with the patient.   [place] Controlled Substance Database was reviewed by me.  This patient was seen by Jonetta Osgood, FNP-C in collaboration with Dr. Clayborn Bigness as a part of collaborative care agreement.   Emireth Cockerham R. Valetta Fuller, MSN, FNP-C Internal medicine

## 2021-07-08 ENCOUNTER — Ambulatory Visit: Payer: Medicare Other

## 2021-07-11 NOTE — Telephone Encounter (Signed)
Form was signed by provider but the date last seen was invalid. Spoke with pts wife and they will go pick up hard copy from [place] office tomorrow and I will also fax it.

## 2021-07-13 ENCOUNTER — Telehealth: Payer: Self-pay

## 2021-07-13 NOTE — Telephone Encounter (Signed)
Spoke with pts wife and advised her that the paperwork has been received and I will be sending it to [place] today. Pt is going out of town but will be back aound the 20th and will call back to see if shoes are in yet if they have not gotten a call to schedule pick up yet.

## 2021-07-13 NOTE — Telephone Encounter (Signed)
Faxed  paperwork for diabetic shoe to [phone] to [facility] and pt was notified that we faxed paper  ?

## 2021-07-19 ENCOUNTER — Telehealth: Payer: Self-pay

## 2021-07-19 NOTE — Telephone Encounter (Signed)
Shoes ordered - [employer] 672 11.5W ?

## 2021-07-29 DIAGNOSIS — M25512 Pain in left shoulder: Secondary | ICD-10-CM | POA: Diagnosis not present

## 2021-08-01 ENCOUNTER — Ambulatory Visit (INDEPENDENT_AMBULATORY_CARE_PROVIDER_SITE_OTHER): Payer: Medicare Other

## 2021-08-01 ENCOUNTER — Other Ambulatory Visit: Payer: Self-pay

## 2021-08-01 DIAGNOSIS — M2041 Other hammer toe(s) (acquired), right foot: Secondary | ICD-10-CM | POA: Diagnosis not present

## 2021-08-01 DIAGNOSIS — M2042 Other hammer toe(s) (acquired), left foot: Secondary | ICD-10-CM | POA: Diagnosis not present

## 2021-08-01 DIAGNOSIS — E1142 Type 2 diabetes mellitus with diabetic polyneuropathy: Secondary | ICD-10-CM | POA: Diagnosis not present

## 2021-08-01 DIAGNOSIS — M21371 Foot drop, right foot: Secondary | ICD-10-CM

## 2021-08-01 NOTE — Progress Notes (Signed)
SITUATION ?Reason for Visit: Fitting of Diabetic [place] ?Patient / Caregiver Report:  Patient is satisfied with fit and function of shoes and insoles. ? ?OBJECTIVE DATA: ?Patient History / Diagnosis:   ?  ICD-10-CM   ?1. Diabetic polyneuropathy associated with type 2 diabetes mellitus ([place])  E11.42   ?  ?2. Hammer toes of both feet  M20.41   ? M20.42   ?  ?3. Right foot drop  M21.371   ?  ? ? ?Change in Status:   None ? ?ACTIONS PERFORMED: ?In-Person Delivery, patient was fit with: ?- 1x pair [id number] PDAC approved prefabricated Diabetic Shoes: [employer] Sprint 672 11.5W ?- 3x pair [id number] PDAC approved vacuum formed custom diabetic insoles; [employer]: [id number] ? ?Shoes and insoles were verified for structural integrity and safety. Patient wore shoes and insoles in office. Skin was inspected and free of areas of concern after wearing shoes and inserts. Shoes and inserts fit properly. Patient / Caregiver provided with ferbal instruction and demonstration regarding donning, doffing, wear, care, proper fit, function, purpose, cleaning, and use of shoes and insoles ' and in all related precautions and risks and benefits regarding shoes and insoles. Patient / Caregiver was instructed to wear properly fitting socks with shoes at all times. Patient was also provided with verbal instruction regarding how to report any failures or malfunctions of shoes or inserts, and necessary follow up care. Patient / Caregiver was also instructed to contact physician regarding change in status that may affect function of shoes and inserts.  ? ?Patient / Caregiver verbalized undersatnding of instruction provided. Patient / Caregiver demonstrated independence with proper donning and doffing of shoes and inserts. ? ?PLAN ?Patient to follow with treating physician as recommended. Plan of care was discussed with and agreed upon by patient and/or caregiver. All questions were answered and concerns addressed. ? ?

## 2021-08-02 ENCOUNTER — Other Ambulatory Visit: Payer: Self-pay | Admitting: Nurse Practitioner

## 2021-08-02 DIAGNOSIS — E1165 Type 2 diabetes mellitus with hyperglycemia: Secondary | ICD-10-CM

## 2021-08-03 DIAGNOSIS — M25512 Pain in left shoulder: Secondary | ICD-10-CM | POA: Diagnosis not present

## 2021-08-05 ENCOUNTER — Encounter: Payer: Self-pay | Admitting: Nurse Practitioner

## 2021-08-05 DIAGNOSIS — M75122 Complete rotator cuff tear or rupture of left shoulder, not specified as traumatic: Secondary | ICD-10-CM | POA: Diagnosis not present

## 2021-08-06 ENCOUNTER — Other Ambulatory Visit: Payer: Self-pay | Admitting: Nurse Practitioner

## 2021-08-06 DIAGNOSIS — E1165 Type 2 diabetes mellitus with hyperglycemia: Secondary | ICD-10-CM

## 2021-08-09 ENCOUNTER — Encounter
Admission: RE | Admit: 2021-08-09 | Discharge: 2021-08-09 | Disposition: A | Discharge status: Home / Self Care | Payer: Medicare Other | Source: Ambulatory Visit | Attending: Orthopedic Surgery | Admitting: Orthopedic Surgery

## 2021-08-09 ENCOUNTER — Other Ambulatory Visit
Admission: RE | Admit: 2021-08-09 | Discharge: 2021-08-09 | Disposition: A | Discharge status: Home / Self Care | Payer: Medicare Other | Source: Ambulatory Visit | Attending: Orthopedic Surgery | Admitting: Orthopedic Surgery

## 2021-08-09 ENCOUNTER — Other Ambulatory Visit: Payer: Self-pay | Admitting: Orthopedic Surgery

## 2021-08-09 VITALS — Ht 75.0 in | Wt 190.0 lb

## 2021-08-09 DIAGNOSIS — Z01818 Encounter for other preprocedural examination: Secondary | ICD-10-CM | POA: Diagnosis not present

## 2021-08-09 DIAGNOSIS — Z7984 Long term (current) use of oral hypoglycemic drugs: Secondary | ICD-10-CM | POA: Diagnosis not present

## 2021-08-09 DIAGNOSIS — I10 Essential (primary) hypertension: Secondary | ICD-10-CM

## 2021-08-09 DIAGNOSIS — E119 Type 2 diabetes mellitus without complications: Secondary | ICD-10-CM

## 2021-08-09 DIAGNOSIS — Z7985 Long-term (current) use of injectable non-insulin antidiabetic drugs: Secondary | ICD-10-CM | POA: Diagnosis not present

## 2021-08-09 LAB — BASIC METABOLIC PANEL
Anion gap: 15 (ref 5–15)
BUN: 16 mg/dL (ref 8–23)
CO2: 21 mmol/L — ABNORMAL LOW (ref 22–32)
Calcium: 9.1 mg/dL (ref 8.9–10.3)
Chloride: 101 mmol/L (ref 98–111)
Creatinine, Ser: 0.6 mg/dL — ABNORMAL LOW (ref 0.61–1.24)
GFR, Estimated: >60 mL/min (ref >60)
Glucose, Bld: 252 mg/dL — ABNORMAL HIGH (ref 70–99)
Potassium: 4 mmol/L (ref 3.5–5.1)
Sodium: 137 mmol/L (ref 135–145)

## 2021-08-09 LAB — CBC
HCT: 48.4 % (ref 39.0–52.0)
Hemoglobin: 16.3 g/dL (ref 13.0–17.0)
MCH: 30.3 pg (ref 26.0–34.0)
MCHC: 33.7 g/dL (ref 30.0–36.0)
MCV: 90 fL (ref 80.0–100.0)
Platelets: 190 10*3/uL (ref 150–400)
RBC: 5.38 MIL/uL (ref 4.22–5.81)
RDW: 13.5 % (ref 11.5–15.5)
WBC: 7.2 10*3/uL (ref 4.0–10.5)
nRBC: 0 % (ref 0.0–0.2)

## 2021-08-09 NOTE — Patient Instructions (Signed)
?Your procedure is scheduled on: Thursday August 11, 2021. ?Report to Day Surgery inside [facility] 2nd floor, stop by admissions desk before getting on elevator.  ?To find out your arrival time please call [phone] between 1PM - 3PM on Wednesday August 10, 2021. ? ?Remember: Instructions that are not followed completely may result in serious medical risk,  ?up to and including death, or upon the discretion of your surgeon and anesthesiologist your  ?surgery may need to be rescheduled.  ? ?  _X__ 1. Do not eat food or drink fluids after midnight the night before your procedure. ?                No chewing gum or hard candies. ? ?__X__2.  On the morning of surgery brush your teeth with toothpaste and water, you ?               may rinse your mouth with mouthwash if you wish.  Do not swallow any toothpaste or mouthwash. ?   ? _X__ 3.  No Alcohol for 24 hours before or after surgery. ? ? _X__ 4.  Do Not Smoke or use e-cigarettes For 24 Hours Prior to Your Surgery. ?                Do not use any chewable tobacco products for at least 6 hours prior to ?                Surgery. ? ?_X__  5.  Do not use any recreational drugs (marijuana, cocaine, heroin, ecstasy, MDMA or other) ?               For at least one week prior to your surgery.  Combination of these drugs with anesthesia ?               May have life threatening results. ? ?____  6.  Bring all medications with you on the day of surgery if instructed.  ? ?__X__  7.  Notify your doctor if there is any change in your medical condition  ?    (cold, fever, infections). ?    ?Do not wear jewelry, make-up, hairpins, clips or nail polish. ?Do not wear lotions, powders, or perfumes. You may wear deodorant. ?Do not shave 48 hours prior to surgery. Men may shave face and neck. ?Do not bring valuables to the hospital.   ? ?[facility] is not responsible for any belongings or valuables. ? ?Contacts, dentures or bridgework may not be worn into  surgery. ?Leave your suitcase in the car. After surgery it may be brought to your room. ?For patients admitted to the hospital, discharge time is determined by your ?treatment team. ?  ?Patients discharged the day of surgery will not be allowed to drive home.   ?Make arrangements for someone to be with you for the first 24 hours of your ?Same Day Discharge. ? ?__X__ Take these medicines the morning of surgery with A SIP OF WATER:  ? ? 1. gabapentin (NEURONTIN) 100 MG  ? 2. tamsulosin (FLOMAX) 0.4 MG ? 3.  ? 4. ? 5. ? 6. ? ?____ Fleet Enema (as directed)  ? ?__X__ Use CHG Soap (or wipes) as directed ? ?____ Use Benzoyl Peroxide Gel as instructed ? ?____ Use inhalers on the day of surgery ? ?__X__ Stop empagliflozin (JARDIANCE) 25 MG 3 days prior to surgery   ? ?____ Take 1/2 of usual insulin dose the night before surgery. No insulin  the morning ?         of surgery.  ? ?____ Call your PCP, cardiologist, or Pulmonologist if taking Coumadin/Plavix/aspirin and ask when to stop before your surgery.  ? ?__X__ One Week prior to surgery- Stop Anti-inflammatories such as Ibuprofen, Aleve, Advil, Motrin, meloxicam (MOBIC), diclofenac, etodolac, ketorolac, Toradol, Daypro, piroxicam, Goody's or BC powders. OK TO USE TYLENOL IF NEEDED ?  ?__X__ Stop ALL supplements until after surgery.   ? ?____ Bring C-Pap to the hospital.  ? ? ?If you have any questions regarding your pre-procedure instructions,  ?Please call Pre-admit Testing at [phone] ?

## 2021-08-09 NOTE — Progress Notes (Signed)
?  Perioperative Services: Pre-Admission/Anesthesia Testing ? ?Abnormal Lab Notification ?  ? ?Date: 08/09/21 ? ?Name: Michael Gross ?MRN:   [id number] ? ?Re: Abnormal labs noted during PAT appointment  ? ?Provider(s) Notified: Thornton Park, MD ?Notification mode: Routed and/or faxed via [facility] ?  ?ABNORMAL LAB VALUE(S): ?Lab Results  ?Component Value Date  ? GLUCOSE 252 (H) 08/09/2021  ?  ?Notes: Patient with a T2DM diagnosis. He is currently on both oral (glimepiride, empagliflozin) and parenteral (semaglutide) therapies. Last Hgb A1c was 9.3% on 07/06/2021. In efforts to reduce his risk of developing SSI/PJI, or other potential perioperative complications, this communication is being sent in order to determine if patient is deemed to have adequate medical optimization, including preoperative glycemic control.  ? ?The odds ratio for SSI/PJI infection is between 2.8 and 3.4 for orthopedic surgery patients with pre-operative serum glucose levels of > 125 mg/dL or a post-operative levels of > 200 mg/dL ([place], 2019).   ? ?Data suggests that a Hgb A1c threshold of 7.7% tends to be more indicative of infection than the commonly used 7% and should perhaps be the pre-operative patient optimization goal (Tarabichi et al., 2017).  ? ?With that being said, the benefit of improving glycemic control must be weighed against the overall risk associated with delaying a necessary elective orthopedic surgery for this patient. This is a Community education officer; no formal response is required. ? ?Citations: ?Charlett Blake, A.F. Reducing the risk of infection after total joint arthroplasty: preoperative optimization. Arthroplasty 1, 4 (2019). [email or web address] ? ?Lorrin Goodell MM, Adelani M, Brigati D, [place]ear[place] SM, [address] [place] JC, [place], [place], [place], Parvizi Lenna Sciara [place]. Determining the Threshold for HbA1c as a Predictor for Adverse Outcomes After Total Joint  Arthroplasty: A Multicenter, Retrospective Study. J Arthroplasty. 2017 Sep;32(9S):S263-S267.e1. [email or web address].  ? ?Honor Loh, MSN, APRN, FNP-C, CEN ?[facility]  ?Peri-operative Services Nurse Practitioner ?Phone: [phone] ?08/09/21 5:07 PM ? ?

## 2021-08-09 NOTE — Patient Instructions (Addendum)
?Your procedure is scheduled on: Thursday August 11, 2021. ?Report to Day Surgery inside [facility] 2nd floor, stop by admissions desk before getting on elevator.  ?To find out your arrival time please call [phone] between 1PM - 3PM on Wednesday August 10, 2021. ? ?Remember: Instructions that are not followed completely may result in serious medical risk,  ?up to and including death, or upon the discretion of your surgeon and anesthesiologist your  ?surgery may need to be rescheduled.  ? ?  _X__ 1. Do not eat food or drink fluids after midnight the night before your procedure. ?                No chewing gum or hard candies.  ? ?__X__2.  On the morning of surgery brush your teeth with toothpaste and water, you ?               may rinse your mouth with mouthwash if you wish.  Do not swallow any toothpaste or mouthwash. ?   ? _X__ 3.  No Alcohol for 24 hours before or after surgery. ? ? _X__ 4.  Do Not Smoke or use e-cigarettes For 24 Hours Prior to Your Surgery. ?                Do not use any chewable tobacco products for at least 6 hours prior to ?                Surgery. ? ?_X__  5.  Do not use any recreational drugs (marijuana, cocaine, heroin, ecstasy, MDMA or other) ?               For at least one week prior to your surgery.  Combination of these drugs with anesthesia ?               May have life threatening results. ? ?____  6.  Bring all medications with you on the day of surgery if instructed.  ? ?__X__  7.  Notify your doctor if there is any change in your medical condition  ?    (cold, fever, infections). ?    ?Do not wear jewelry, make-up, hairpins, clips or nail polish. ?Do not wear lotions, powders, or perfumes or deodorant. ?Do not shave 48 hours prior to surgery. Men may shave face and neck. ?Do not bring valuables to the hospital.   ? ?[facility] is not responsible for any belongings or valuables. ? ?Contacts, dentures or bridgework may not be worn into surgery. ?Leave  your suitcase in the car. After surgery it may be brought to your room. ?For patients admitted to the hospital, discharge time is determined by your ?treatment team. ?  ?Patients discharged the day of surgery will not be allowed to drive home.   ?Make arrangements for someone to be with you for the first 24 hours of your ?Same Day Discharge. ? ?__X__ Take these medicines the morning of surgery with A SIP OF WATER:  ? ? 1. gabapentin (NEURONTIN) 100 MG  ? 2. tamsulosin (FLOMAX) 0.4 MG ? 3.  ? 4. ? 5. ? 6. ? ?____ Fleet Enema (as directed)  ? ?__X__ Use CHG Soap (or wipes) as directed ? ?____ Use Benzoyl Peroxide Gel as instructed ? ?____ Use inhalers on the day of surgery ? ?__X__ Stop empagliflozin (JARDIANCE) 25 MG 3 days prior to surgery   ? ?____ Take 1/2 of usual insulin dose the night before surgery. No insulin the  morning ?         of surgery.  ? ?____ Call your PCP, cardiologist, or Pulmonologist if taking Coumadin/Plavix/aspirin and ask when to stop before your surgery.  ? ?__X__ One Week prior to surgery- Stop Anti-inflammatories such as Ibuprofen, Aleve, Advil, Motrin, meloxicam (MOBIC), diclofenac, etodolac, ketorolac, Toradol, Daypro, piroxicam, Goody's or BC powders. OK TO USE TYLENOL IF NEEDED ?  ?__X__ One week prior to surgery-Stop supplements ALL until after surgery.   ? ?____ Bring C-Pap to the hospital.  ? ? ?If you have any questions regarding your pre-procedure instructions,  ?Please call Pre-admit Testing at [phone] ? ?Take these medications Wednesday as normal  ? ?ezetimibe (ZETIA) 10 MG tablet ?gabapentin (NEURONTIN) 100 MG  ?glimepiride (AMARYL) 4 MG tablet ?rosuvastatin (CRESTOR) 40 MG ?tamsulosin (FLOMAX) 0.4 MG ? ?

## 2021-08-10 ENCOUNTER — Other Ambulatory Visit: Payer: Medicare Other

## 2021-08-11 ENCOUNTER — Other Ambulatory Visit: Payer: Self-pay

## 2021-08-11 ENCOUNTER — Encounter: Payer: Self-pay | Admitting: Orthopedic Surgery

## 2021-08-11 ENCOUNTER — Ambulatory Visit: Payer: Medicare Other

## 2021-08-11 ENCOUNTER — Encounter
Admission: RE | Disposition: A | Discharge status: Home / Self Care | Payer: Self-pay | Source: Home / Self Care | Attending: Orthopedic Surgery

## 2021-08-11 ENCOUNTER — Ambulatory Visit: Payer: Medicare Other | Admitting: Certified Registered"

## 2021-08-11 ENCOUNTER — Ambulatory Visit
Admission: RE | Admit: 2021-08-11 | Discharge: 2021-08-11 | Disposition: A | Discharge status: Home / Self Care | Payer: Medicare Other | Attending: Orthopedic Surgery | Admitting: Orthopedic Surgery

## 2021-08-11 DIAGNOSIS — Z7984 Long term (current) use of oral hypoglycemic drugs: Secondary | ICD-10-CM | POA: Insufficient documentation

## 2021-08-11 DIAGNOSIS — M19012 Primary osteoarthritis, left shoulder: Secondary | ICD-10-CM | POA: Diagnosis not present

## 2021-08-11 DIAGNOSIS — W19XXXA Unspecified fall, initial encounter: Secondary | ICD-10-CM | POA: Insufficient documentation

## 2021-08-11 DIAGNOSIS — S46012A Strain of muscle(s) and tendon(s) of the rotator cuff of left shoulder, initial encounter: Secondary | ICD-10-CM | POA: Diagnosis not present

## 2021-08-11 DIAGNOSIS — E1165 Type 2 diabetes mellitus with hyperglycemia: Secondary | ICD-10-CM | POA: Diagnosis not present

## 2021-08-11 DIAGNOSIS — M25812 Other specified joint disorders, left shoulder: Secondary | ICD-10-CM | POA: Diagnosis not present

## 2021-08-11 DIAGNOSIS — M66812 Spontaneous rupture of other tendons, left shoulder: Secondary | ICD-10-CM | POA: Diagnosis not present

## 2021-08-11 DIAGNOSIS — G8918 Other acute postprocedural pain: Secondary | ICD-10-CM | POA: Diagnosis not present

## 2021-08-11 DIAGNOSIS — M75102 Unspecified rotator cuff tear or rupture of left shoulder, not specified as traumatic: Secondary | ICD-10-CM | POA: Diagnosis not present

## 2021-08-11 DIAGNOSIS — M7542 Impingement syndrome of left shoulder: Secondary | ICD-10-CM | POA: Diagnosis not present

## 2021-08-11 DIAGNOSIS — M25512 Pain in left shoulder: Secondary | ICD-10-CM | POA: Diagnosis not present

## 2021-08-11 DIAGNOSIS — M75122 Complete rotator cuff tear or rupture of left shoulder, not specified as traumatic: Secondary | ICD-10-CM | POA: Diagnosis not present

## 2021-08-11 HISTORY — PX: SHOULDER ARTHROSCOPY WITH OPEN ROTATOR CUFF REPAIR AND DISTAL CLAVICLE ACROMINECTOMY: SHX5683

## 2021-08-11 LAB — GLUCOSE, CAPILLARY
Glucose-Capillary: 140 mg/dL — ABNORMAL HIGH (ref 70–99)
Glucose-Capillary: 102 mg/dL — ABNORMAL HIGH (ref 70–99)
Glucose-Capillary: 137 mg/dL — ABNORMAL HIGH (ref 70–99)

## 2021-08-11 SURGERY — SHOULDER ARTHROSCOPY WITH OPEN ROTATOR CUFF REPAIR AND DISTAL CLAVICLE ACROMINECTOMY
Anesthesia: General | Site: Shoulder | Laterality: Left

## 2021-08-11 MED ORDER — PHENYLEPHRINE 40 MCG/ML (10ML) SYRINGE FOR IV PUSH (FOR BLOOD PRESSURE SUPPORT)
PREFILLED_SYRINGE | INTRAVENOUS | Status: DC | PRN
  Administered 2021-08-11 (×5): 80 ug via INTRAVENOUS

## 2021-08-11 MED ORDER — CHLORHEXIDINE GLUCONATE CLOTH 2 % EX PADS: 6.0000 | MEDICATED_PAD | Freq: Once | CUTANEOUS | Status: DC

## 2021-08-11 MED ORDER — ACETAMINOPHEN 10 MG/ML IV SOLN: 1000.0000 mg | Freq: Once | INTRAVENOUS | Status: DC | PRN

## 2021-08-11 MED ORDER — PROMETHAZINE HCL 25 MG/ML IJ SOLN: 6.2500 mg | INTRAMUSCULAR | Status: DC | PRN

## 2021-08-11 MED ORDER — EPINEPHRINE PF 1 MG/ML IJ SOLN
INTRAMUSCULAR | Status: AC
  Filled 2021-08-11: qty 4

## 2021-08-11 MED ORDER — CHLORHEXIDINE GLUCONATE 0.12 % MT SOLN: 15.0000 mL | Freq: Once | OROMUCOSAL | Status: AC

## 2021-08-11 MED ORDER — MIDAZOLAM HCL 2 MG/2ML IJ SOLN
2.0000 mg | Freq: Once | INTRAMUSCULAR | Status: AC
Start: 2021-08-11 — End: 2021-08-11

## 2021-08-11 MED ORDER — DEXMEDETOMIDINE (PRECEDEX) IN NS 20 MCG/5ML (4 MCG/ML) IV SYRINGE
PREFILLED_SYRINGE | INTRAVENOUS | Status: DC | PRN
Start: 2021-08-11 — End: 2021-08-11
  Administered 2021-08-11: 8 ug via INTRAVENOUS

## 2021-08-11 MED ORDER — PROPOFOL 10 MG/ML IV BOLUS
INTRAVENOUS | Status: AC
  Filled 2021-08-11: qty 20

## 2021-08-11 MED ORDER — SODIUM CHLORIDE 0.9 % IV SOLN: INTRAVENOUS | Status: DC

## 2021-08-11 MED ORDER — ACETAMINOPHEN 500 MG PO TABS
ORAL_TABLET | ORAL | Status: AC
  Administered 2021-08-11: 1000 mg via ORAL
  Filled 2021-08-11: qty 2

## 2021-08-11 MED ORDER — NEOMYCIN-POLYMYXIN B GU 40-200000 IR SOLN
Status: DC | PRN
  Administered 2021-08-11: 2 mL

## 2021-08-11 MED ORDER — BUPIVACAINE HCL (PF) 0.5 % IJ SOLN
INTRAMUSCULAR | Status: AC
  Filled 2021-08-11: qty 10

## 2021-08-11 MED ORDER — OXYCODONE HCL 5 MG/5ML PO SOLN: 5.0000 mg | Freq: Once | ORAL | Status: DC | PRN

## 2021-08-11 MED ORDER — PHENYLEPHRINE HCL (PRESSORS) 10 MG/ML IV SOLN
INTRAVENOUS | Status: AC
Start: 2021-08-11 — End: ?
  Filled 2021-08-11: qty 1

## 2021-08-11 MED ORDER — OXYCODONE HCL 5 MG PO TABS: 5.0000 mg | ORAL_TABLET | Freq: Once | ORAL | Status: DC | PRN

## 2021-08-11 MED ORDER — MIDAZOLAM HCL 2 MG/2ML IJ SOLN
INTRAMUSCULAR | Status: AC
  Administered 2021-08-11: 2 mg via INTRAVENOUS
  Filled 2021-08-11: qty 2

## 2021-08-11 MED ORDER — ROCURONIUM BROMIDE 10 MG/ML (PF) SYRINGE
PREFILLED_SYRINGE | INTRAVENOUS | Status: AC
  Filled 2021-08-11: qty 10

## 2021-08-11 MED ORDER — FENTANYL CITRATE (PF) 100 MCG/2ML IJ SOLN
INTRAMUSCULAR | Status: DC | PRN
  Administered 2021-08-11 (×2): 50 ug via INTRAVENOUS

## 2021-08-11 MED ORDER — NEOMYCIN-POLYMYXIN B GU 40-200000 IR SOLN
Status: AC
  Filled 2021-08-11: qty 4

## 2021-08-11 MED ORDER — MIDAZOLAM HCL 2 MG/2ML IJ SOLN
INTRAMUSCULAR | Status: AC
  Filled 2021-08-11: qty 2

## 2021-08-11 MED ORDER — FAMOTIDINE 20 MG PO TABS: 20.0000 mg | ORAL_TABLET | Freq: Once | ORAL | Status: AC

## 2021-08-11 MED ORDER — GLYCOPYRROLATE 0.2 MG/ML IJ SOLN
INTRAMUSCULAR | Status: DC | PRN
  Administered 2021-08-11: .2 mg via INTRAVENOUS

## 2021-08-11 MED ORDER — FENTANYL CITRATE (PF) 100 MCG/2ML IJ SOLN: 25.0000 ug | INTRAMUSCULAR | Status: DC | PRN

## 2021-08-11 MED ORDER — CEFAZOLIN SODIUM-DEXTROSE 2-4 GM/100ML-% IV SOLN
INTRAVENOUS | Status: AC
  Filled 2021-08-11: qty 100

## 2021-08-11 MED ORDER — ORAL CARE MOUTH RINSE: 15.0000 mL | Freq: Once | OROMUCOSAL | Status: AC

## 2021-08-11 MED ORDER — ACETAMINOPHEN 500 MG PO TABS: 1000.0000 mg | ORAL_TABLET | ORAL | Status: AC

## 2021-08-11 MED ORDER — PROPOFOL 10 MG/ML IV BOLUS
INTRAVENOUS | Status: DC | PRN
  Administered 2021-08-11: 200 mg via INTRAVENOUS
  Administered 2021-08-11: 50 mg via INTRAVENOUS

## 2021-08-11 MED ORDER — ONDANSETRON HCL 4 MG/2ML IJ SOLN
INTRAMUSCULAR | Status: DC | PRN
  Administered 2021-08-11: 4 mg via INTRAVENOUS

## 2021-08-11 MED ORDER — DROPERIDOL 2.5 MG/ML IJ SOLN: 0.6250 mg | Freq: Once | INTRAMUSCULAR | Status: DC | PRN

## 2021-08-11 MED ORDER — LIDOCAINE HCL (PF) 1 % IJ SOLN
INTRAMUSCULAR | Status: AC
  Filled 2021-08-11: qty 30

## 2021-08-11 MED ORDER — 0.9 % SODIUM CHLORIDE (POUR BTL) OPTIME
TOPICAL | Status: DC | PRN
  Administered 2021-08-11: 500 mL

## 2021-08-11 MED ORDER — ONDANSETRON HCL 4 MG PO TABS: 4.0000 mg | ORAL_TABLET | Freq: Three times a day (TID) | ORAL | 0 refills | Status: DC | PRN

## 2021-08-11 MED ORDER — CHLORHEXIDINE GLUCONATE 0.12 % MT SOLN
OROMUCOSAL | Status: AC
  Administered 2021-08-11: 15 mL via OROMUCOSAL
  Filled 2021-08-11: qty 15

## 2021-08-11 MED ORDER — CEFAZOLIN SODIUM-DEXTROSE 2-4 GM/100ML-% IV SOLN
2.0000 g | INTRAVENOUS | Status: AC
  Administered 2021-08-11: 2 g via INTRAVENOUS

## 2021-08-11 MED ORDER — RINGERS IRRIGATION IR SOLN
Status: DC | PRN
Start: 2021-08-11 — End: 2021-08-11
  Administered 2021-08-11: 33000 mL

## 2021-08-11 MED ORDER — ROCURONIUM BROMIDE 100 MG/10ML IV SOLN
INTRAVENOUS | Status: DC | PRN
  Administered 2021-08-11: 30 mg via INTRAVENOUS
  Administered 2021-08-11: 50 mg via INTRAVENOUS
  Administered 2021-08-11 (×2): 10 mg via INTRAVENOUS

## 2021-08-11 MED ORDER — SUGAMMADEX SODIUM 200 MG/2ML IV SOLN
INTRAVENOUS | Status: DC | PRN
  Administered 2021-08-11: 160 mg via INTRAVENOUS

## 2021-08-11 MED ORDER — PHENYLEPHRINE HCL-NACL 20-0.9 MG/250ML-% IV SOLN
INTRAVENOUS | Status: DC | PRN
  Administered 2021-08-11: 50 ug/min via INTRAVENOUS

## 2021-08-11 MED ORDER — BUPIVACAINE HCL (PF) 0.5 % IJ SOLN
INTRAMUSCULAR | Status: DC | PRN
  Administered 2021-08-11: 10 mL via PERINEURAL

## 2021-08-11 MED ORDER — FENTANYL CITRATE (PF) 100 MCG/2ML IJ SOLN
INTRAMUSCULAR | Status: AC
Start: 2021-08-11 — End: ?
  Filled 2021-08-11: qty 2

## 2021-08-11 MED ORDER — EPINEPHRINE PF 1 MG/ML IJ SOLN
INTRAMUSCULAR | Status: DC | PRN
  Administered 2021-08-11: 4 mg

## 2021-08-11 MED ORDER — FAMOTIDINE 20 MG PO TABS
ORAL_TABLET | ORAL | Status: AC
  Administered 2021-08-11: 20 mg via ORAL
  Filled 2021-08-11: qty 1

## 2021-08-11 MED ORDER — BUPIVACAINE LIPOSOME 1.3 % IJ SUSP
INTRAMUSCULAR | Status: DC | PRN
  Administered 2021-08-11: 10 mL via PERINEURAL

## 2021-08-11 MED ORDER — LIDOCAINE HCL (PF) 2 % IJ SOLN
INTRAMUSCULAR | Status: AC
Start: 2021-08-11 — End: ?
  Filled 2021-08-11: qty 5

## 2021-08-11 MED ORDER — EPHEDRINE SULFATE (PRESSORS) 50 MG/ML IJ SOLN
INTRAMUSCULAR | Status: DC | PRN
  Administered 2021-08-11 (×2): 5 mg via INTRAVENOUS

## 2021-08-11 MED ORDER — BUPIVACAINE-EPINEPHRINE (PF) 0.25% -1:200000 IJ SOLN
INTRAMUSCULAR | Status: AC
  Filled 2021-08-11: qty 30

## 2021-08-11 MED ORDER — LIDOCAINE HCL (CARDIAC) PF 100 MG/5ML IV SOSY
PREFILLED_SYRINGE | INTRAVENOUS | Status: DC | PRN
  Administered 2021-08-11: 60 mg via INTRAVENOUS

## 2021-08-11 MED ORDER — OXYCODONE HCL 5 MG PO TABS: 5.0000 mg | ORAL_TABLET | ORAL | 0 refills | Status: DC | PRN

## 2021-08-11 MED ORDER — BUPIVACAINE LIPOSOME 1.3 % IJ SUSP
INTRAMUSCULAR | Status: AC
Start: 2021-08-11 — End: 2021-08-11
  Filled 2021-08-11: qty 10

## 2021-08-11 MED ORDER — CHLORHEXIDINE GLUCONATE CLOTH 2 % EX PADS
6.0000 | MEDICATED_PAD | Freq: Once | CUTANEOUS | Status: AC
  Administered 2021-08-11: 6 via TOPICAL

## 2021-08-11 MED ORDER — FENTANYL CITRATE PF 50 MCG/ML IJ SOSY
PREFILLED_SYRINGE | INTRAMUSCULAR | Status: AC
  Filled 2021-08-11: qty 1

## 2021-08-11 SURGICAL SUPPLY — 70 items
ADAPTER IRRIG TUBE 2 SPIKE SOL (ADAPTER) ×4 IMPLANT
ADPR TBG 2 SPK PMP STRL ASCP (ADAPTER) ×2
ANCH SUT 5.5 KNTLS PEEK (Orthopedic Implant) ×3 IMPLANT
ANCH SUT Q-FX 2.8 (Anchor) ×2 IMPLANT
ANCHOR ALL-SUT Q-FIX 2.8 (Anchor) ×6 IMPLANT
ANCHOR SUT 5.5 MULTIFIX (Orthopedic Implant) ×3 IMPLANT
CANNULA 5.75X7 CRYSTAL CLEAR (CANNULA) ×4 IMPLANT
CANNULA PARTIAL THREAD 2X7 (CANNULA) ×2 IMPLANT
CANNULA TWIST IN 8.25X9CM (CANNULA) ×4 IMPLANT
CONNECTOR PERFECT PASSER (CONNECTOR) ×3 IMPLANT
COOLER POLAR GLACIER W/PUMP (MISCELLANEOUS) ×2 IMPLANT
DEVICE SUCT BLK HOLE OR FLOOR (MISCELLANEOUS) ×4 IMPLANT
DRAPE 3/4 80X56 (DRAPES) ×2 IMPLANT
DRAPE INCISE IOBAN 66X45 STRL (DRAPES) ×2 IMPLANT
DRAPE U-SHAPE 47X51 STRL (DRAPES) ×2 IMPLANT
DURAPREP 26ML APPLICATOR (WOUND CARE) ×6 IMPLANT
ELECT REM PT RETURN 9FT ADLT (ELECTROSURGICAL) ×2
ELECTRODE REM PT RTRN 9FT ADLT (ELECTROSURGICAL) ×1 IMPLANT
GAUZE SPONGE 4X4 12PLY STRL (GAUZE/BANDAGES/DRESSINGS) ×2 IMPLANT
GAUZE XEROFORM 1X8 LF (GAUZE/BANDAGES/DRESSINGS) ×2 IMPLANT
GLOVE SURG ORTHO LTX SZ9 (GLOVE) ×6 IMPLANT
GLOVE SURG UNDER POLY LF SZ9 (GLOVE) ×2 IMPLANT
GOWN STRL REUS TWL 2XL XL LVL4 (GOWN DISPOSABLE) ×2 IMPLANT
GOWN STRL REUS W/ TWL LRG LVL3 (GOWN DISPOSABLE) ×1 IMPLANT
GOWN STRL REUS W/TWL LRG LVL3 (GOWN DISPOSABLE) ×2
IV LACTATED RINGER IRRG 3000ML (IV SOLUTION) ×16
IV LR IRRIG 3000ML ARTHROMATIC (IV SOLUTION) ×8 IMPLANT
KIT STABILIZATION SHOULDER (MISCELLANEOUS) ×2 IMPLANT
KIT SUTURE 2.8 Q-FIX DISP (MISCELLANEOUS) ×3 IMPLANT
KIT SUTURETAK 3.0 INSERT PERC (KITS) IMPLANT
KIT TURNOVER KIT A (KITS) ×2 IMPLANT
MANIFOLD NEPTUNE II (INSTRUMENTS) ×4 IMPLANT
MASK FACE SPIDER DISP (MASK) ×2 IMPLANT
MAT ABSORB  FLUID 56X50 GRAY (MISCELLANEOUS) ×3
MAT ABSORB FLUID 56X50 GRAY (MISCELLANEOUS) ×3 IMPLANT
NDL SAFETY ECLIPSE 18X1.5 (NEEDLE) ×1 IMPLANT
NEEDLE HYPO 18GX1.5 SHARP (NEEDLE) ×2
NEEDLE HYPO 22GX1.5 SAFETY (NEEDLE) ×2 IMPLANT
NS IRRIG 500ML POUR BTL (IV SOLUTION) ×2 IMPLANT
PACK ARTHROSCOPY SHOULDER (MISCELLANEOUS) ×2 IMPLANT
PAD ABD DERMACEA PRESS 5X9 (GAUZE/BANDAGES/DRESSINGS) ×2 IMPLANT
PAD ARMBOARD 7.5X6 YLW CONV (MISCELLANEOUS) ×4 IMPLANT
PAD WRAPON POLAR SHDR XLG (MISCELLANEOUS) ×1 IMPLANT
PASSER SUT FIRSTPASS SELF (INSTRUMENTS) ×3 IMPLANT
SHAVER BLADE BONE CUTTER 4.5 (BLADE) ×2 IMPLANT
SHAVER BLADE TAPERED BLUNT 4 (BLADE) ×2 IMPLANT
SPONGE T-LAP 18X18 ~~LOC~~+RFID (SPONGE) ×2 IMPLANT
STRIP CLOSURE SKIN 1/2X4 (GAUZE/BANDAGES/DRESSINGS) ×2 IMPLANT
SUT ETHILON 4 0 PS 2 18 (SUTURE) ×1 IMPLANT
SUT ETHILON 4-0 (SUTURE) ×2
SUT ETHILON 4-0 FS2 18XMFL BLK (SUTURE) ×1
SUT LASSO 90 DEG SD STR (SUTURE) IMPLANT
SUT MNCRL 4-0 (SUTURE) ×2
SUT MNCRL 4-0 27XMFL (SUTURE) ×1
SUT PDS AB 0 CT1 27 (SUTURE) ×6 IMPLANT
SUT PERFECTPASSER WHITE CART (SUTURE) ×9 IMPLANT
SUT SMART STITCH CARTRIDGE (SUTURE) ×9 IMPLANT
SUT ULTRABRAID 2 COBRAID 38 (SUTURE) IMPLANT
SUT VIC AB 0 CT1 36 (SUTURE) ×6 IMPLANT
SUT VIC AB 2-0 CT2 27 (SUTURE) ×2 IMPLANT
SUTURE ETHLN 4-0 FS2 18XMF BLK (SUTURE) ×1 IMPLANT
SUTURE MNCRL 4-0 27XMF (SUTURE) ×1 IMPLANT
SYR 10ML LL (SYRINGE) ×2 IMPLANT
TAPE MICROFOAM 4IN (TAPE) ×2 IMPLANT
TUBING CONNECTING 10 (TUBING) ×2 IMPLANT
TUBING INFLOW SET DBFLO PUMP (TUBING) ×2 IMPLANT
TUBING OUTFLOW SET DBLFO PUMP (TUBING) ×2 IMPLANT
WAND WEREWOLF FLOW 90D (MISCELLANEOUS) ×2 IMPLANT
WATER STERILE IRR 500ML POUR (IV SOLUTION) ×2 IMPLANT
WRAPON POLAR PAD SHDR XLG (MISCELLANEOUS) ×2

## 2021-08-11 NOTE — Anesthesia Procedure Notes (Signed)
Anesthesia Regional Block: Interscalene brachial plexus block  ? ?Pre-Anesthetic Checklist: , timeout performed,  Correct Patient, Correct Site, Correct Laterality,  Correct Procedure, Correct Position, site marked,  Risks and benefits discussed,  Surgical consent,  Pre-op evaluation,  At surgeon's request and post-op pain management ? ?Laterality: Left ? ?Prep: chloraprep     ?  ?Needles:  ?Injection technique: Single-shot ? ?Needle Type: Stimiplex   ? ? ?Needle Length: 9cm  ?Needle Gauge: 22  ? ? ? ?Additional Needles: ? ? ?Procedures:,,,, ultrasound used (permanent image in chart),,    ?Narrative:  ?Start time: 08/11/2021 10:15 AM ?End time: 08/11/2021 10:25 AM ?Injection made incrementally with aspirations every 20 mL. ? ?Performed by: Personally  ?Anesthesiologist: Iran Ouch, MD ? ?Additional Notes: ?Patient consented for risk and benefits of nerve block including but not limited to nerve damage, failed block, bleeding and infection.  Patient voiced understanding. ? ?Functioning IV was confirmed and monitors were applied.  Timeout done prior to procedure and prior to any sedation being given to the patient.  Patient confirmed procedure site prior to any sedation given to the patient.  A 27m 22ga Stimuplex needle was used. Sterile prep,hand hygiene and sterile gloves were used.  Minimal sedation used for procedure.  No paresthesia endorsed by patient during the procedure.  Negative aspiration and negative test dose prior to incremental administration of local anesthetic. The patient tolerated the procedure well with no immediate complications. ? ? ? ?

## 2021-08-11 NOTE — H&P (Signed)
PREOPERATIVE H&P ? ?Chief Complaint: Left Shoulder Rotator Cuff Tear ? ?HPI: ?Michael Gross is a 66 y.o. male who presents for preoperative history and physical with a diagnosis of full-thickness, retracted left Shoulder Rotator Cuff Tear confirmed by MRI.  The patient's left shoulder pain began in early February after a fall. The patient fell onto the posterolateral shoulder. The patient is a diabetic. Symptoms of pain, weakness and limited range of motion are significantly impairing activities of daily living.  Given his full-thickness and retracted tear and clinical symptoms, he was recommended for surgical repair of his rotator cuff tear.  Patient agreed with the plan for surgery. ? ?Past Medical History:  ?Diagnosis Date  ? Basal cell carcinoma   ? BPH (benign prostatic hyperplasia)   ? Diabetes ([place])   ? Hyperlipidemia   ? ?Past Surgical History:  ?Procedure Laterality Date  ? HERNIA REPAIR    ? 2003  ? KNEE SURGERY    ? 1998  ? MOHS SURGERY    ? 2003  ? ?Social History  ? ?Socioeconomic History  ? Marital status: Married  ?  Spouse name: Michael Gross  ? Number of children: 4  ? Years of education: Not on file  ? Highest education level: Not on file  ?Occupational History  ? Not on file  ?Tobacco Use  ? Smoking status: Never  ? Smokeless tobacco: Never  ?Vaping Use  ? Vaping Use: Never used  ?Substance and Sexual Activity  ? Alcohol use: Yes  ?  Comment: rare  ? Drug use: No  ? Sexual activity: Yes  ?Other Topics Concern  ? Not on file  ?Social History Narrative  ? Not on file  ? ?Social Determinants of Health  ? ?Financial Resource Strain: Not on file  ?Food Insecurity: Not on file  ?Transportation Needs: Not on file  ?Physical Activity: Not on file  ?Stress: Not on file  ?Social Connections: Not on file  ? ?Family History  ?Problem Relation Age of Onset  ? Heart disease Father   ? Prostate cancer Neg Hx   ? Kidney cancer Neg Hx   ? Bladder Cancer Neg Hx   ? ?No Known Allergies ?Prior to Admission medications    ?Medication Sig Start Date End Date Taking? Authorizing Provider  ?Ascorbic Acid (VITAMIN C PO) Take by mouth.   Yes [provider]  ?Cholecalciferol (VITAMIN D3 PO) Take by mouth.   Yes [provider]  ?Coenzyme Q10-Vitamin E (QUNOL ULTRA COQ10 PO) Take 1 capsule by mouth daily.   Yes [provider]  ?CRANBERRY-VITAMIN C-D MANNOSE PO Take 1 tablet by mouth daily.   Yes [provider]  ?Cyanocobalamin (VITAMIN B-12 PO) Take by mouth.   Yes [provider]  ?empagliflozin (JARDIANCE) 25 MG TABS tablet Take 1 tablet (25 mg total) by mouth daily. 07/06/21  Yes Abernathy, Yetta Flock, NP  ?ezetimibe (ZETIA) 10 MG tablet Take 1 tablet (10 mg total) by mouth daily. 02/17/21  Yes Abernathy, Yetta Flock, NP  ?FIBER SELECT GUMMIES PO Take 2 tablets by mouth daily.   Yes [provider]  ?gabapentin (NEURONTIN) 100 MG capsule Take 1 capsule by mouth every morning and early afternoon. ?Patient taking differently: Take 100 mg by mouth in the morning. 07/06/21  Yes Abernathy, [name], NP  ?glimepiride (AMARYL) 4 MG tablet TAKE 1 TABLET(4 MG) BY MOUTH TWICE DAILY ?Patient taking differently: Take 4 mg by mouth See admin instructions. Take 1 tablet (4 mg) by mouth scheduled and may repeat  dose in the evening as needed for blood sugar 08/06/21  Yes Lavera Guise, MD  ?meloxicam (MOBIC) 15 MG tablet Take 15 mg by mouth daily as needed (inflammation/pain.).   Yes [provider]  ?Multiple Vitamin (MULTIVITAMIN WITH MINERALS) TABS tablet Take 1 tablet by mouth daily.   Yes [provider]  ?rosuvastatin (CRESTOR) 40 MG tablet Take 1 tablet (40 mg total) by mouth daily. 02/17/21  Yes Jonetta Osgood, NP  ?Semaglutide,0.25 or 0.'5MG'$ /DOS, (OZEMPIC, 0.25 OR 0.5 MG/DOSE,) 2 MG/1.5ML SOPN INJECT 0.5 MG INTO THE SKIN ONCE A WEEK. ?Patient taking differently: Inject 0.25 mg into the skin every Saturday. 08/02/21  Yes Abernathy, Yetta Flock, NP  ?tamsulosin (FLOMAX) 0.4 MG CAPS capsule  Take 2 capsules (0.8 mg total) by mouth daily. Pt takes 2 capsules a day 04/12/21  Yes Abernathy, [name], NP  ?gabapentin (NEURONTIN) 300 MG capsule Take one tab po qhs for spasm ?Patient not taking: Reported on 08/08/2021 07/06/21   Jonetta Osgood, NP  ?Lancets 30G MISC Use to check blood sugars 5 times per day 03/27/13   [provider]  ? ? ? ?Positive ROS: All other systems have been reviewed and were otherwise negative with the exception of those mentioned in the HPI and as above. ? ?Physical Exam: ?General: Alert, no acute distress ?Cardiovascular: Regular rate and rhythm, no murmurs rubs or gallops.  No pedal edema ?Respiratory: Clear to auscultation bilaterally, no wheezes rales or rhonchi. No cyanosis, no use of accessory musculature ?GI: No organomegaly, abdomen is soft and non-tender nondistended with positive bowel sounds. ?Skin: Skin intact, no lesions within the operative field. ?Neurologic: Sensation intact distally ?Psychiatric: Patient is competent for consent with normal mood and affect ?Lymphatic: No cervical lymphadenopathy ? ?MUSCULOSKELETAL: Left Shoulder: The patient can forward elevate to approximately 150 degrees and abduct to approximately 120-130 degrees with pain in the mid range of abduction. He demonstrated weakness to shoulder abduction and external rotation. He did not have significant weakness to shoulder internal rotation. He has positive impingement signs, but no apprehension or instability. He has full digital, wrist and elbow range of motion, intact sensation to light touch and a palpable radial pulse.  ? ?Radiology: I reviewed the MRI images as well as radiology report. I gave the patient a copy of his MRI report as well as the pertinent image showing his rotator cuff tear. The patient had a large tear of the rotator cuff involving retracted tears of the supra and infraspinatus insertions. He had a partial-thickness interstitial tear of the subscapularis. He had moderate  bursitis and mild fatty atrophy of the supraspinatus and infraspinatus. The patient has AC joint arthropathy as well as mild glenohumeral joint arthropathy. He has moderate tendinosis of the biceps tendon.  ? ?Assessment: ?Left Shoulder Rotator Cuff Tear, subacromial impingement, AC joint arthrosis and possible biceps tendon tear ? ?Plan: ?Plan for Procedure(s): ?Left shoulder arthroscopic acromial decompression, distal clavicle excision mini open rotator cuff repair with possible biceps tenodesis ? ?I reviewed the details of the operation as well as the postoperative course with the patient.  I marked the left shoulder according hospital's correct site of surgery protocol.  Preop history and physical was performed at the bedside.  Patient received an interscalene block with Exparel by the anesthesia service in the preoperative area.  Patient has an elevated globin A1c, but it was felt that his rotator cuff tear needed to be repaired and could not be delayed until his hemoglobin A1c was improved.  I have  discussed with the patient that he needs to continue to work with his primary care physician at better control of his blood sugars. ? ?I discussed the risks and benefits of surgery. The risks include but are not limited to infection, bleeding, nerve or blood vessel injury, joint stiffness or loss of motion, persistent pain, weakness or instability, retear of the rotator cuff, hardware failure and the need for further surgery.  Patient understood these risks and wished to proceed.  ? ? ? ? ? ?Thornton Park, MD ? ? ?08/11/2021 ?10:40 AM ?  ?

## 2021-08-11 NOTE — Anesthesia Procedure Notes (Signed)
Procedure Name: Intubation ?Date/Time: 08/11/2021 11:15 AM ?Performed by: Johney Maine, CRNA ?Pre-anesthesia Checklist: Patient identified, Patient being monitored, Timeout performed, Emergency Drugs available and Suction available ?Patient Re-evaluated:Patient Re-evaluated prior to induction ?Oxygen Delivery Method: Circle system utilized ?Preoxygenation: Pre-oxygenation with 100% oxygen ?Induction Type: IV induction ?Ventilation: Mask ventilation without difficulty ?Laryngoscope Size: Sabra Heck and 2 ?Grade View: Grade I ?Tube type: Oral ?Tube size: 7.5 mm ?Number of attempts: 1 ?Placement Confirmation: ETT inserted through vocal cords under direct vision, positive ETCO2 and breath sounds checked- equal and bilateral ?Secured at: 22 cm ?Tube secured with: Tape ?Dental Injury: Teeth and Oropharynx as per pre-operative assessment  ? ? ? ? ?

## 2021-08-11 NOTE — Anesthesia Postprocedure Evaluation (Signed)
Anesthesia Post Note ? ?Patient: Michael Gross ? ?Procedure(s) Performed: SHOULDER ARTHROSCOPY WITH OPEN ROTATOR CUFF REPAIR AND DISTAL CLAVICLE ACROMINECTOMY (Left: Shoulder) ? ?Patient location during evaluation: PACU ?Anesthesia Type: General ?Level of consciousness: awake and alert ?Pain management: pain level controlled ?Vital Signs Assessment: post-procedure vital signs reviewed and stable ?Respiratory status: spontaneous breathing, nonlabored ventilation, respiratory function stable and patient connected to nasal cannula oxygen ?Cardiovascular status: blood pressure returned to baseline and stable ?Postop Assessment: no apparent nausea or vomiting ?Anesthetic complications: no ? ? ?No notable events documented. ? ? ?Last Vitals:  ?Vitals:  ? 08/11/21 1500 08/11/21 1515  ?BP: (!) 99/54 111/67  ?Pulse: 84 91  ?Resp: 18 16  ?Temp:    ?SpO2: 93% 93%  ?  ?Last Pain:  ?Vitals:  ? 08/11/21 1515  ?TempSrc:   ?PainSc: 0-No pain  ? ? ?  ?  ?  ?  ?  ?  ? ?Michael Gross ? ? ? ? ?

## 2021-08-11 NOTE — Transfer of Care (Signed)
Immediate Anesthesia Transfer of Care Note ? ?Patient: Michael Gross ? ?Procedure(s) Performed: SHOULDER ARTHROSCOPY WITH OPEN ROTATOR CUFF REPAIR AND DISTAL CLAVICLE ACROMINECTOMY (Left: Shoulder) ? ?Patient Location: PACU ? ?Anesthesia Type:General ? ?Level of Consciousness: awake, drowsy and patient cooperative ? ?Airway & Oxygen Therapy: Patient Spontanous Breathing and Patient connected to face mask oxygen ? ?Post-op Assessment: Report given to RN and Post -op Vital signs reviewed and stable ? ?Post vital signs: Reviewed and stable ? ?Last Vitals:  ?Vitals Value Taken Time  ?BP 103/45 08/11/21 1425  ?Temp    ?Pulse 84 08/11/21 1427  ?Resp 22 08/11/21 1427  ?SpO2 97 % 08/11/21 1427  ?Vitals shown include unvalidated device data. ? ?Last Pain:  ?Vitals:  ? 08/11/21 0902  ?TempSrc: Oral  ?PainSc: 0-No pain  ?   ? ?  ? ?Complications: No notable events documented. ?

## 2021-08-11 NOTE — Op Note (Signed)
08/11/2021 ? ?2:25 PM ? ?PATIENT:  Michael Gross  66 y.o. male ? ?PRE-OPERATIVE DIAGNOSIS:  Left Shoulder Rotator Cuff Tear, subacromial impingement, AC joint arthrosis and possible tear of the long head of the biceps tendon ? ?POST-OPERATIVE DIAGNOSIS:  Left Shoulder Rotator Cuff Tear, subacromial impingement, AC joint arthrosis and high-grade partial-thickness tear of the long head of the biceps tendon ? ?PROCEDURE: Left shoulder arthroscopic subacromial decompression, distal clavicle excision and mini open rotator cuff tear and biceps tenodesis ? ?SURGEON:  Surgeon(s) and Role: ?   Thornton Park, MD - Primary ? ?ANESTHESIA:   general and paracervical block ? ?PREOPERATIVE INDICATIONS:  Michael Gross is a  66 y.o. male with a diagnosis of large full-thickness retracted tear of the left rotator cuff who recommended for and agreed with surgical fixation of his rotator cuff tear ? ?The risks benefits and alternatives were discussed with the patient preoperatively including but not limited to the risks of infection, bleeding, nerve injury, persistent pain or weakness, failure of the hardware, re-tear of the rotator cuff and the need for further surgery. Medical risks include DVT and pulmonary embolism, myocardial infarction, stroke, pneumonia, respiratory failure and death. Patient understood these risks and wished to proceed. ? ?OPERATIVE IMPLANTS: [place] Multifix anchors x 3 & Smith and Nephew Q Fix anchors x 2 ? ?OPERATIVE FINDINGS:  ?High-grade partial-thickness tear of the long head of the biceps tendon ?Full-thickness and retracted tear of the supra and infraspinatus tendons ?Subacromial impingement ?Acromioclavicular joint arthrosis ? ?OPERATIVE PROCEDURE: The patient was met in the preoperative area. The left shoulder was signed with the word yes and my initials according the hospital's correct site of surgery protocol. The patient underwent placement of an interscalene block with Exparel  by the anesthesia service in the preoperative area.  Preop history and physical was performed at the bedside.  I reviewed the risk and benefits of the surgery. ? ?The patient was then brought to the operating room where he underwent general endotracheal intubation.  The patient was placed in a beachchair position.  A spider arm positioner was used for this case. Examination under anesthesia revealed no limitation of motion or instability with load shift testing. The patient had a negative sulcus sign. ? ?The patient was prepped and draped in a sterile fashion. A timeout was performed to verify the patient's name, date of birth, medical record number, correct site of surgery and correct procedure to be performed there was also used to verify the patient received antibiotics that all appropriate instruments, implants and radiographs studies were available in the room. Once all in attendance were in agreement case began. ? ?Bony landmarks were drawn out with a surgical marker along with proposed arthroscopy incisions. These were pre-injected with 1% lidocaine plain. An 11 blade was used to establish a posterior portal through which the arthroscope was placed in the glenohumeral joint. A full diagnostic examination of the shoulder was performed.  the anterior portal was established under direct visualization with an 18-gauge spinal needle. A 5.75 the medial arthroscopic cannula was placed through this anterior portal.  ? ?The intra-articular portion of the biceps tendon was found to have advanced intratendinous degeneration with significant thickening of the tendon, therefore the decision was made to perform a tenodesis. A Smith & Nephew perfect Pass suture placed in the biceps tendon and an arthroscopic tenotomy was performed arthroscopy using an [employer] werewolf wand.   ? ?The arthroscope was then placed in the subacromial  space. A lateral portal was then established using an 18-gauge spinal needle for  localization. Extensive bursitis was encountered and debrided using a Dyonics flyer shaver blade and a 90? Smith & Nephew werewolf wand from the lateral portal. A subacromial decompression was also performed using a 4.5 mm Dyonics bone cutter shaver blade from the lateral portal and a distal clavicle excision was performed from the anterior portal.   ? ?Five Smith & Nephew Perfect Pass sutures were placed in the lateral border of the rotator cuff tear. The greater tuberosity was debrided using a 4.5 mm Dyonics bone cutter shaver blade to remove all remaining foreign fibers of the rotator cuff.  Debridement was performed until punctate bleeding was seen at the greater tuberosity footprint to allow for rotator cuff healing.  Final arthroscopic images were taken and all arthroscopic instruments were then removed and the mini-open portion of the procedure began.  ? ?A saber-type incision was made along the lateral border of the acromion. The deltoid muscle was identified and split in line with its fibers which allowed visualization of the rotator cuff.  The biceps tendon and its associated tagging suture were brought out through the deltoid split.  The Perfect Pass suture previously placed in the lateral border of the rotator cuff were also brought out through the deltoid split.    ? ?A perfect pass suture was placed in the biceps tendon where it entered the into tuberous groove and approximately 15 of the intra-articular biceps was resected using a #15 blade.   A tenodesis was performed placing the biceps tendon at the top of the bicipital groove with a single [employer] MultiFix anchor.   ? ?The lateral edge of the rotator cuff was debrided until healthy, viable tissue remained.  Two Q fix anchors were then placed at the articular margin of the humeral head. The 4 limbs of each anchor were then passed medially through the rotator cuff with a Perfect Pass suture passer. These sutures were clamped with a hemostat  for later medial row fixation. ? ?The five  perfect pass sutures from the lateral border of the rotator cuff were then anchored to the greater tuberosity of the humeral head using two [place] Multifix anchors. These anchors were tensioned to allow for anatomic reduction of the rotator cuff to the greater tuberosity footprint.  The medial row repair was then completed using an arthroscopic knot tying technique with the Q fix anchor sutures.  Once all sutures were tied down, arthroscopic images of the double row repair were taken with the arthroscope both externally and arthroscopically from the glenohumeral joint. ? ?All incisions were copiously irrigated. The deltoid fascia was repaired using a 0 Vicryl suturean interrupted fashion. .  The subcutaneous tissue of all incisions were closed with a 2-0 Vicryl. Skin closure for the arthroscopic incisions was performed with 4-0 nylon. The skin edges of the saber incision were approximated with a running 4-0 undyed Monocryl.  A dry sterile dressing was applied.  The patient was placed in an abduction sling, with a Polar Care sleeve. ? ?All sharp and instrument counts were correct at the conclusion of the case. I was scrubbed and present for the entire case. I spoke with the patient's wife postoperatively to let her know the case had been performed without complication and the patient was stable in recovery room.  I reviewed the postop instructions with her and answered all her questions. ?

## 2021-08-11 NOTE — Anesthesia Preprocedure Evaluation (Addendum)
Anesthesia Evaluation  ?Patient identified by MRN, date of birth, ID band ?Patient awake ? ? ? ?Reviewed: ?Allergy & Precautions, NPO status , Patient's Chart, lab work & pertinent test results ? ?Airway ?Mallampati: III ? ?TM Distance: >3 FB ?Neck ROM: full ? ? ? Dental ?  ?Pulmonary ?neg pulmonary ROS,  ?  ?Pulmonary exam normal ? ? ? ? ? ? ? Cardiovascular ?Exercise Tolerance: Good ?Normal cardiovascular exam ? ? ?  ?Neuro/Psych ?negative neurological ROS ? negative psych ROS  ? GI/Hepatic ?negative GI ROS, Neg liver ROS,   ?Endo/Other  ?diabetes, Poorly Controlled, Type 2, Oral Hypoglycemic Agents ? Renal/GU ?  ? ?  ?Musculoskeletal ? ? Abdominal ?Normal abdominal exam  (+)   ?Peds ? Hematology ?negative hematology ROS ?(+)   ?Anesthesia Other Findings ?Past Medical History: ?No date: Basal cell carcinoma ?No date: BPH (benign prostatic hyperplasia) ?No date: Diabetes [facility]) ?No date: Hyperlipidemia ? ?Past Surgical History: ?No date: HERNIA REPAIR ?    Comment:  2003 ?No date: KNEE SURGERY ?    Comment:  1998 ?No date: MOHS SURGERY ?    Comment:  2003 ? ? ? ? Reproductive/Obstetrics ?negative OB ROS ? ?  ? ? ? ? ? ? ? ? ? ? ? ? ? ?  ?  ? ? ? ? ? ? ?Anesthesia Physical ?Anesthesia Plan ? ?ASA: 2 ? ?Anesthesia Plan: General ETT  ? ?Post-op Pain Management: Regional block*  ? ?Induction: Intravenous ? ?PONV Risk Score and Plan: Ondansetron, Dexamethasone, Midazolam and Treatment may vary due to age or medical condition ? ?Airway Management Planned: Oral ETT ? ?Additional Equipment:  ? ?Intra-op Plan:  ? ?Post-operative Plan: Extubation in OR ? ?Informed Consent: I have reviewed the patients History and Physical, chart, labs and discussed the procedure including the risks, benefits and alternatives for the proposed anesthesia with the patient or authorized representative who has indicated his/her understanding and acceptance.  ? ? ? ?Dental advisory given ? ?Plan Discussed with:  Anesthesiologist, CRNA and Surgeon ? ?Anesthesia Plan Comments: (Surgeon and patient desire to proceed with surgery despite elevated A1c. Pt was notified about risk for infection. He states he has better controlled his blood glucose recently and wishes to proceed. )  ? ? ? ? ?Anesthesia Quick Evaluation ? ?

## 2021-08-11 NOTE — Discharge Instructions (Addendum)
AMBULATORY SURGERY  ?DISCHARGE INSTRUCTIONS ? ? ?The drugs that you were given will stay in your system until tomorrow so for the next 24 hours you should not: ? ?Drive an automobile ?Make any legal decisions ?Drink any alcoholic beverage ? ? ?You may resume regular meals tomorrow.  Today it is better to start with liquids and gradually work up to solid foods. ? ?You may eat anything you prefer, but it is better to start with liquids, then soup and crackers, and gradually work up to solid foods. ? ? ?Please notify your doctor immediately if you have any unusual bleeding, trouble breathing, redness and pain at the surgery site, drainage, fever, or pain not relieved by medication. ? ? ? ?Additional Instructions: ? ? ? ? ?Please contact your physician with any problems or Same Day Surgery at [phone], Monday through Friday 6 am to 4 pm, or [place] at [facility] number at [phone].  ? ?POLAR CARE INFORMATION ? ?[email or web address] ? ?How to use [place]?  YouTube   [email or web address] ? ?OPERATING INSTRUCTIONS ? ?Start the product ?With dry hands, connect the transformer to the electrical connection located on the top of the cooler. Next, plug the transformer into an appropriate electrical outlet. The unit will automatically start running at this point. ? ?To stop the pump, disconnect electrical power.  ?Unplug to stop the product when not in use. Unplugging the Polar Care unit turns it off. Always unplug immediately after use. Never leave it plugged in while unattended. Remove pad.  ?  ?FIRST ADD WATER TO FILL LINE, THEN ICE---Replace ice when existing ice is almost melted ? ?1 Discuss Treatment with your Licensed Health Care Practitioner and Use Only as Prescribed ?2 Apply Insulation Barrier & Cold Therapy Pad ?3 Check for Moisture ?4 Inspect Skin Regularly ? ?Tips and Armed forces technical officer Tips ?1. Use cubed or chunked ice for optimal  performance. ?2. It is recommended to drain the Pad between uses. To drain the pad, hold the Pad upright with the hose pointed toward the ground. Depress the black plunger and allow water to drain out. ?3. You may disconnect the Pad from the unit without removing the pad from the affected area by depressing the silver tabs on the hose coupling and gently pulling the hoses apart. The Pad and unit will seal itself and will not leak. Note: Some dripping during release is normal. ?4. DO NOT RUN PUMP WITHOUT WATER! The pump in this unit is designed to run with water. Running the unit without water will cause permanent damage to the pump. ?5. Unplug unit before removing lid. ? ?TROUBLESHOOTING GUIDE ?Pump not running, Water not flowing to the pad, Pad is not getting cold ?1. Make sure the transformer is plugged into the wall outlet. ?2. Confirm that the ice and water are filled to the indicated levels. ?3. Make sure there are no kinks in the pad. ?4. Gently pull on the blue tube to make sure the tube/pad junction is straight. ?5. Remove the pad from the treatment site and ll it while the pad is lying at; then reapply. ?6. Confirm that the pad couplings are securely attached to the unit. Listen for the double clicks (Figure 1) to confirm the pad couplings are securely attached. ? ?Leaks    Note: Some condensation on the lines, controller, and pads is unavoidable, especially in warmer climates. ?1. If using a [place] Polar Care Cold Therapy unit with a detachable Cold Therapy Pad,  and a leak exists (other than condensation on the lines) disconnect the pad couplings. Make sure the silver tabs on the couplings are depressed before reconnecting the pad to the pump hose; then confirm both sides of the coupling are properly clicked in. ?2. If the coupling continues to leak or a leak is detected in the pad itself, stop using it and call [place] at [phone]. ? ?Cleaning ?After use, empty and dry the unit with a soft  cloth. Warm water and mild detergent may be used occasionally to clean the pump and tubes. ? ?WARNING: The [place] can be cold enough to cause serious injury, including full skin necrosis. Follow these Operating Instructions, and carefully read the Product Insert (see pouch on side of unit) and the Cold Therapy Pad Fitting Instructions (provided with each Cold Therapy Pad) prior to use. ? ? ?SHOULDER SLING IMMOBILIZER  ? ?VIDEO ?Slingshot 2 Shoulder Brace Application - YouTube ---[email or web address] ? ?INSTRUCTIONS ?While supporting the injured arm, slide the forearm into the sling. Wrap the adjustable shoulder strap around the neck and shoulders and attach the strap end to the sling using  the ?alligator strap tab.?  ?Adjust the shoulder strap to the required length. ?Position the shoulder pad behind the neck. ?To secure the shoulder pad location (optional), pull the shoulder strap away from the shoulder pad, unfold the hook material on the top of the pad, then press the shoulder strap back onto the hook material to secure the pad in place. ?Attach the closure strap across the open top of the sling. Position the strap so that it holds the arm securely in the sling. Next, attach the thumb strap to the open end of the sling between the thumb and fingers. ?After sling has been fit, it may be easily removed and reapplied using the quick release buckle on shoulder strap. ?If a neutral pillow or 15? abduction pillow is included, place the pillow at the waistline. Attach the sling to the pillow, lining up hook material on the pillow with the loop on sling. Adjust the waist strap to fit.  ?If waist strap is too long, cut it to fit. Use the small piece of double sided hook material (located on top of the pillow) to secure the strap end. Place the double sided hook material on the inside of the cut strap end and secure it to the waist strap.     ?If no pillow is included, attach the waist  strap to the sling and adjust to fit.   ? ?Washing Instructions: Straps and sling must be removed and cleaned regularly depending on your activity level and perspiration. Hand wash straps and sling in cold water with mild detergent, rinse, air dry ? ? ?  ? ?   Interscalene Nerve Block with Exparel ? ? For your surgery you have received an Interscalene Nerve Block with Exparel. ?Nerve Blocks affect many types of nerves, including nerves that control movement, pain and normal sensation.  You may experience feelings such as numbness, tingling, heaviness, weakness or the inability to move your arm or the feeling or sensation that your arm has "fallen asleep". ?A nerve block with Exparel can last up to 5 days.  Usually the weakness wears off first.  The tingling and heaviness usually wear off next.  Finally you may start to notice pain.  Keep in mind that this may occur in any order.  Once a nerve block starts to wear off it is usually completely  gone within 60 minutes. ?ISNB may cause mild shortness of breath, a hoarse voice, blurry vision, unequal pupils, or drooping of the face on the same side as the nerve block.  These symptoms will usually resolve with the numbness.  Very rarely the procedure itself can cause mild seizures. ?If needed, your surgeon will give you a prescription for pain medication.  It will take about 60 minutes for the oral pain medication to become fully effective.  So, it is recommended that you start taking this medication before the nerve block first begins to wear off, or when you first begin to feel discomfort. ?Take your pain medication only as prescribed.  Pain medication can cause sedation and decrease your breathing if you take more than you need for the level of pain that you have. ?Nausea is a common side effect of many pain medications.  You may want to eat something before taking your pain medicine to prevent nausea. ?After an Interscalene nerve block, you cannot feel pain, pressure  or extremes in temperature in the effected arm.  Because your arm is numb it is at an increased risk for injury.  To decrease the possibility of injury, please practice the following: ? ?While you are awake change the po

## 2021-08-14 ENCOUNTER — Encounter: Payer: Self-pay | Admitting: Orthopedic Surgery

## 2021-08-24 DIAGNOSIS — M75122 Complete rotator cuff tear or rupture of left shoulder, not specified as traumatic: Secondary | ICD-10-CM | POA: Diagnosis not present

## 2021-08-29 ENCOUNTER — Telehealth: Payer: Self-pay

## 2021-09-07 DIAGNOSIS — M25612 Stiffness of left shoulder, not elsewhere classified: Secondary | ICD-10-CM | POA: Diagnosis not present

## 2021-09-07 DIAGNOSIS — M25512 Pain in left shoulder: Secondary | ICD-10-CM | POA: Diagnosis not present

## 2021-09-09 DIAGNOSIS — M25612 Stiffness of left shoulder, not elsewhere classified: Secondary | ICD-10-CM | POA: Diagnosis not present

## 2021-09-09 DIAGNOSIS — M25512 Pain in left shoulder: Secondary | ICD-10-CM | POA: Diagnosis not present

## 2021-09-12 DIAGNOSIS — M75122 Complete rotator cuff tear or rupture of left shoulder, not specified as traumatic: Secondary | ICD-10-CM | POA: Diagnosis not present

## 2021-09-13 ENCOUNTER — Other Ambulatory Visit: Payer: Self-pay

## 2021-09-13 MED ORDER — SOLIQUA 100-33 UNT-MCG/ML ~~LOC~~ SOPN: 15.0000 [IU] | PEN_INJECTOR | Freq: Every morning | SUBCUTANEOUS | 1 refills | Status: DC

## 2021-09-13 NOTE — Telephone Encounter (Signed)
Called pt Michael Gross advising that [facility] sent SOLIQUA to his pharmacy and I called Pharmacy to have them run thru insurance and it has 5 pens to a box so maybe a total of 100 day supply at $105.00.  informed pt on message that we can do a sample to try before paying for full prescription  ?

## 2021-09-13 NOTE — Telephone Encounter (Signed)
Per [name] we added SOLIQUA 15 units every morning before meal and sent new rx to his pharmacy.  I called and per Pharmacist, pt's insurance copay is $105.00 for 90 days (5 pens per box will be close to 100 day supply).  LMOM for pt to call back to inform him of medication ?

## 2021-09-14 DIAGNOSIS — M75122 Complete rotator cuff tear or rupture of left shoulder, not specified as traumatic: Secondary | ICD-10-CM | POA: Diagnosis not present

## 2021-09-19 DIAGNOSIS — M75122 Complete rotator cuff tear or rupture of left shoulder, not specified as traumatic: Secondary | ICD-10-CM | POA: Diagnosis not present

## 2021-09-21 ENCOUNTER — Ambulatory Visit (INDEPENDENT_AMBULATORY_CARE_PROVIDER_SITE_OTHER): Payer: Medicare Other | Admitting: Nurse Practitioner

## 2021-09-21 ENCOUNTER — Encounter: Payer: Self-pay | Admitting: Nurse Practitioner

## 2021-09-21 VITALS — BP 136/80 | HR 80 | Temp 98.3°F | Resp 16 | Ht 75.0 in | Wt 190.4 lb

## 2021-09-21 DIAGNOSIS — M75122 Complete rotator cuff tear or rupture of left shoulder, not specified as traumatic: Secondary | ICD-10-CM | POA: Diagnosis not present

## 2021-09-21 DIAGNOSIS — R251 Tremor, unspecified: Secondary | ICD-10-CM | POA: Diagnosis not present

## 2021-09-21 DIAGNOSIS — I10 Essential (primary) hypertension: Secondary | ICD-10-CM

## 2021-09-21 DIAGNOSIS — R21 Rash and other nonspecific skin eruption: Secondary | ICD-10-CM

## 2021-09-21 DIAGNOSIS — E1140 Type 2 diabetes mellitus with diabetic neuropathy, unspecified: Secondary | ICD-10-CM | POA: Diagnosis not present

## 2021-09-21 DIAGNOSIS — L03114 Cellulitis of left upper limb: Secondary | ICD-10-CM | POA: Diagnosis not present

## 2021-09-21 DIAGNOSIS — E1165 Type 2 diabetes mellitus with hyperglycemia: Secondary | ICD-10-CM

## 2021-09-21 MED ORDER — MUPIROCIN 2 % EX OINT: 1.0000 "application " | TOPICAL_OINTMENT | Freq: Two times a day (BID) | CUTANEOUS | 1 refills | Status: DC

## 2021-09-21 MED ORDER — SULFAMETHOXAZOLE-TRIMETHOPRIM 800-160 MG PO TABS: 1.0000 | ORAL_TABLET | Freq: Two times a day (BID) | ORAL | 0 refills | Status: AC

## 2021-09-21 MED ORDER — TRIAMCINOLONE ACETONIDE 0.1 % EX CREA: 1.0000 "application " | TOPICAL_CREAM | Freq: Two times a day (BID) | CUTANEOUS | 1 refills | Status: DC

## 2021-09-21 NOTE — Progress Notes (Signed)
[facility] [place], [place] [address]  Internal MEDICINE  Office Visit Note  Patient Name: Michael Gross  [id number]  [id number]  Date of Service: 09/21/2021  Chief Complaint  Patient presents with   Follow-up    Left arm was scraped and looks to be infected, neuropathy in feet, discuss possible parkinson    Diabetes   Hyperlipidemia   Rash    Right side of neck / face    HPI Michael Gross presents for follow-up visit for diabetes, hyperlipidemia and a couple of other issues.  Patient declined having his A1c checked today and would like to postpone it for 5 or 6 weeks.  He has been having issues with his insurance company and the price of Ozempic went up to more than $200 for a month supply and an alternative medication needed to be prescribed.  He has started taking Soliqua at 15 units daily and his sugars have been slightly elevated at times but fluctuating between 120-180 roughly.  He is asking about increasing the dose of Soliqua and how to go about doing that.  He continues to also take Jardiance and glimepiride.  Glimepiride may be the first medication that is stopped if the [facility] was really helping his glucose levels. --Patient has a wound on his left forearm that has a yellow wound bed and is swollen and red around the site and he reports that it has had purulent drainage come out of it he has had to drain it.  He is wash the area with hydrogen peroxide.  He also thinks that it may be infected. --Patient has small red spots on face and neck and is not sure what it is coming from.  Patient has not started using any new products except for a new cologne which she states could be the issue.  He states that the red spots are not painful or itchy and look like a rash. --Patient reports that he has noted a resting tremor that sometimes just comes on randomly but he has noticed it more when he is at rest.  When speaking to his wife about this, she states it could  be several different things and one of the topics that was brought up was Parkinson's disease. --Patient does have problems with neuropathy and is currently on gabapentin. --Patient reports that he is still in physical therapy after having surgery on his left shoulder from a fall.     Current Medication: Outpatient Encounter Medications as of 09/21/2021  Medication Sig   Ascorbic Acid (VITAMIN C PO) Take by mouth.   Cholecalciferol (VITAMIN D3 PO) Take by mouth.   Cyanocobalamin (VITAMIN B-12 PO) Take by mouth.   empagliflozin (JARDIANCE) 25 MG TABS tablet Take 1 tablet (25 mg total) by mouth daily.   ezetimibe (ZETIA) 10 MG tablet Take 1 tablet (10 mg total) by mouth daily.   FIBER SELECT GUMMIES PO Take 2 tablets by mouth daily.   gabapentin (NEURONTIN) 100 MG capsule Take 1 capsule by mouth every morning and early afternoon. (Patient taking differently: Take 100 mg by mouth in the morning.)   glimepiride (AMARYL) 4 MG tablet TAKE 1 TABLET(4 MG) BY MOUTH TWICE DAILY (Patient taking differently: Take 4 mg by mouth See admin instructions. Take 1 tablet (4 mg) by mouth scheduled and may repeat dose in the evening as needed for blood sugar)   Insulin Glargine-Lixisenatide (SOLIQUA) 100-33 UNT-MCG/ML SOPN Inject 15 Units into the skin every morning. On empty stomach  E11.65   Lancets 30G MISC Use to check blood sugars 5 times per day   Multiple Vitamin (MULTIVITAMIN WITH MINERALS) TABS tablet Take 1 tablet by mouth daily.   mupirocin ointment (BACTROBAN) 2 % Apply 1 application. topically 2 (two) times daily.   rosuvastatin (CRESTOR) 40 MG tablet Take 1 tablet (40 mg total) by mouth daily.   sulfamethoxazole-trimethoprim (BACTRIM DS) 800-160 MG tablet Take 1 tablet by mouth 2 (two) times daily for 7 days.   triamcinolone cream (KENALOG) 0.1 % Apply 1 application. topically 2 (two) times daily.   [DISCONTINUED] Coenzyme Q10-Vitamin E (QUNOL ULTRA COQ10 PO) Take 1 capsule by mouth daily.  (Patient not taking: Reported on 09/21/2021)   [DISCONTINUED] CRANBERRY-VITAMIN C-D MANNOSE PO Take 1 tablet by mouth daily. (Patient not taking: Reported on 09/21/2021)   [DISCONTINUED] gabapentin (NEURONTIN) 300 MG capsule Take one tab po qhs for spasm (Patient not taking: Reported on 08/08/2021)   [DISCONTINUED] ondansetron (ZOFRAN) 4 MG tablet Take 1 tablet (4 mg total) by mouth every 8 (eight) hours as needed for nausea or vomiting. (Patient not taking: Reported on 09/21/2021)   [DISCONTINUED] oxyCODONE (OXY IR/ROXICODONE) 5 MG immediate release tablet Take 1-2 tablets (5-10 mg total) by mouth every 4 (four) hours as needed (for post-operative pain). (Patient not taking: Reported on 09/21/2021)   [DISCONTINUED] tamsulosin (FLOMAX) 0.4 MG CAPS capsule Take 2 capsules (0.8 mg total) by mouth daily. Pt takes 2 capsules a day (Patient not taking: Reported on 09/21/2021)   No facility-administered encounter medications on file as of 09/21/2021.    Surgical History: Past Surgical History:  Procedure Laterality Date   HERNIA REPAIR     2003   [place]   MOHS SURGERY     2003   SHOULDER ARTHROSCOPY WITH OPEN ROTATOR CUFF REPAIR AND DISTAL CLAVICLE ACROMINECTOMY Left 08/11/2021   Procedure: SHOULDER ARTHROSCOPY WITH OPEN ROTATOR CUFF REPAIR AND DISTAL CLAVICLE ACROMINECTOMY;  Surgeon: Thornton Park, MD;  Location: [facility] ORS;  Service: Orthopedics;  Laterality: Left;    Medical History: Past Medical History:  Diagnosis Date   Basal cell carcinoma    BPH (benign prostatic hyperplasia)    Diabetes ([place])    Hyperlipidemia     Family History: Family History  Problem Relation Age of Onset   Heart disease Father    Prostate cancer Neg Hx    Kidney cancer Neg Hx    Bladder Cancer Neg Hx     Social History   Socioeconomic History   Marital status: Married    Spouse name: [name]   Number of children: 4   Years of education: Not on file   Highest education level: Not on file   Occupational History   Not on file  Tobacco Use   Smoking status: Never   Smokeless tobacco: Never  Vaping Use   Vaping Use: Never used  Substance and Sexual Activity   Alcohol use: Yes    Comment: rare   Drug use: No   Sexual activity: Yes  Other Topics Concern   Not on file  Social History Narrative   Not on file   Social Determinants of Health   Financial Resource Strain: Not on file  Food Insecurity: Not on file  Transportation Needs: Not on file  Physical Activity: Not on file  Stress: Not on file  Social Connections: Not on file  Intimate Partner Violence: Not on file      Review of Systems  Constitutional:  Negative  for chills, fatigue and unexpected weight change.  HENT:  Negative for congestion, postnasal drip, rhinorrhea, sneezing and sore throat.   Eyes:  Negative for redness.  Respiratory:  Negative for cough, chest tightness, shortness of breath and wheezing.   Cardiovascular:  Negative for chest pain and palpitations.  Gastrointestinal:  Negative for abdominal pain, constipation, diarrhea, nausea and vomiting.  Genitourinary:  Negative for dysuria and frequency.  Musculoskeletal:  Negative for arthralgias, back pain, joint swelling and neck pain.  Skin:  Positive for rash.  Neurological:  Positive for tremors. Negative for numbness.  Hematological:  Negative for adenopathy. Does not bruise/bleed easily.  Psychiatric/Behavioral:  Negative for behavioral problems (Depression), sleep disturbance and suicidal ideas. The patient is not nervous/anxious.    Vital Signs: BP 136/80   Pulse 80   Temp 98.3 F (36.8 C)   Resp 16   Ht '6\' 3"'$  (1.905 m)   Wt 190 lb 6.4 oz (86.4 kg)   SpO2 98%   BMI 23.80 kg/m    Physical Exam Vitals reviewed.  Constitutional:      General: He is not in acute distress.    Appearance: Normal appearance. He is normal weight. He is not ill-appearing.  HENT:     Head: Normocephalic and atraumatic.  Eyes:     Pupils: Pupils  are equal, round, and reactive to light.  Cardiovascular:     Rate and Rhythm: Normal rate and regular rhythm.  Pulmonary:     Effort: Pulmonary effort is normal. No respiratory distress.  Neurological:     Mental Status: He is alert and oriented to person, place, and time.     Motor: Tremor present.  Psychiatric:        Mood and Affect: Mood normal.        Behavior: Behavior normal.       Assessment/Plan: 1. Uncontrolled type 2 diabetes mellitus with hyperglycemia ([place]) Patient was started on Soliqua prior to today's office visit and he has noticed that his glucose levels have improved some but are not optimally controlled yet.  Patient provided with instructions on how to increase his Soliqua dose.  Instructed patient to increase Soliqua dose by 4 units and then wait 5 to 7 days and continue checking glucose levels.  If his glucose levels continue to remain elevated above 160 after waiting 5 to 7 days, he can increase the Soliqua dose by 4 units again.  Patient encouraged to call the clinic or send a message if he has any questions about titrating his Soliqua dose or if he wants to ask the staff about increasing the dose if he is unsure about doing it on his own.  2. Essential hypertension Blood pressure is stable and controlled without medication at this time other than Jardiance which patient is on for his diabetes but this may be having a positive effect on his blood pressure as well  3. Tremor of both hands Patient reports a tremor noticed in both hands and is worried he could have Parkinson's and was wondering how that is evaluated, referred to neurology for evaluation of tremor. - Ambulatory referral to Neurology  4. Cellulitis of left arm There is a superficial wound to the left forearm that looks to be infected and possibly developing cellulitis, oral antibiotic prescribed to resolve developing infection.  Topical antibiotic prescribed to help resolve developing infection and he  will the wound - sulfamethoxazole-trimethoprim (BACTRIM DS) 800-160 MG tablet; Take 1 tablet by mouth 2 (two) times daily for  7 days.  Dispense: 14 tablet; Refill: 0 - mupirocin ointment (BACTROBAN) 2 %; Apply 1 application. topically 2 (two) times daily.  Dispense: 30 g; Refill: 1  5. Neuropathy due to type 2 diabetes mellitus ([place]) Patient continues to have neuropathy related to type 2 diabetes which is not optimally controlled yet.  6. Localized papular rash Triamcinolone cream prescribed for localized papular rash on his face and neck, patient instructed to apply twice daily until resolved.  If it does not resolve in the next couple of weeks, instructed patient to call the clinic and he would be referred to dermatology for further evaluation - triamcinolone cream (KENALOG) 0.1 %; Apply 1 application. topically 2 (two) times daily.  Dispense: 45 g; Refill: 1   General Counseling: Keyshon verbalizes understanding of the findings of todays visit and agrees with plan of treatment. I have discussed any further diagnostic evaluation that may be needed or ordered today. We also reviewed his medications today. he has been encouraged to call the office with any questions or concerns that should arise related to todays visit.    Orders Placed This Encounter  Procedures   Ambulatory referral to Neurology    Meds ordered this encounter  Medications   sulfamethoxazole-trimethoprim (BACTRIM DS) 800-160 MG tablet    Sig: Take 1 tablet by mouth 2 (two) times daily for 7 days.    Dispense:  14 tablet    Refill:  0   triamcinolone cream (KENALOG) 0.1 %    Sig: Apply 1 application. topically 2 (two) times daily.    Dispense:  45 g    Refill:  1   mupirocin ointment (BACTROBAN) 2 %    Sig: Apply 1 application. topically 2 (two) times daily.    Dispense:  30 g    Refill:  1    Return for need CPE next office visit and also need nurse visit in end of june for A1C check.   Total time spent:30  Minutes Time spent includes review of chart, medications, test results, and follow up plan with the patient.   [place] Controlled Substance Database was reviewed by me.  This patient was seen by Jonetta Osgood, FNP-C in collaboration with Dr. Clayborn Bigness as a part of collaborative care agreement.   Zanita Millman R. Valetta Fuller, MSN, FNP-C Internal medicine

## 2021-09-26 ENCOUNTER — Other Ambulatory Visit: Payer: Self-pay | Admitting: Nurse Practitioner

## 2021-09-26 DIAGNOSIS — E1140 Type 2 diabetes mellitus with diabetic neuropathy, unspecified: Secondary | ICD-10-CM

## 2021-09-26 DIAGNOSIS — M75122 Complete rotator cuff tear or rupture of left shoulder, not specified as traumatic: Secondary | ICD-10-CM | POA: Diagnosis not present

## 2021-09-28 DIAGNOSIS — M75122 Complete rotator cuff tear or rupture of left shoulder, not specified as traumatic: Secondary | ICD-10-CM | POA: Diagnosis not present

## 2021-09-29 ENCOUNTER — Encounter: Payer: Self-pay | Admitting: Internal Medicine

## 2021-09-30 ENCOUNTER — Telehealth: Payer: Self-pay

## 2021-09-30 NOTE — Telephone Encounter (Signed)
Awaiting 09/21/21 office notes for neurology referral-Toni

## 2021-10-09 ENCOUNTER — Encounter: Payer: Self-pay | Admitting: Nurse Practitioner

## 2021-10-10 NOTE — Telephone Encounter (Signed)
Neurology referral sent via [facility] to [facility]-Toni 

## 2021-10-14 DIAGNOSIS — M75122 Complete rotator cuff tear or rupture of left shoulder, not specified as traumatic: Secondary | ICD-10-CM | POA: Diagnosis not present

## 2021-10-17 DIAGNOSIS — M75122 Complete rotator cuff tear or rupture of left shoulder, not specified as traumatic: Secondary | ICD-10-CM | POA: Diagnosis not present

## 2021-10-20 ENCOUNTER — Other Ambulatory Visit: Payer: Self-pay | Admitting: Nurse Practitioner

## 2021-10-20 DIAGNOSIS — N401 Enlarged prostate with lower urinary tract symptoms: Secondary | ICD-10-CM

## 2021-10-21 DIAGNOSIS — M75122 Complete rotator cuff tear or rupture of left shoulder, not specified as traumatic: Secondary | ICD-10-CM | POA: Diagnosis not present

## 2021-10-24 DIAGNOSIS — M75122 Complete rotator cuff tear or rupture of left shoulder, not specified as traumatic: Secondary | ICD-10-CM | POA: Diagnosis not present

## 2021-10-25 DIAGNOSIS — J029 Acute pharyngitis, unspecified: Secondary | ICD-10-CM | POA: Diagnosis not present

## 2021-10-25 DIAGNOSIS — J069 Acute upper respiratory infection, unspecified: Secondary | ICD-10-CM | POA: Diagnosis not present

## 2021-10-25 DIAGNOSIS — B349 Viral infection, unspecified: Secondary | ICD-10-CM | POA: Diagnosis not present

## 2021-10-25 DIAGNOSIS — Z20822 Contact with and (suspected) exposure to covid-19: Secondary | ICD-10-CM | POA: Diagnosis not present

## 2021-10-25 DIAGNOSIS — E118 Type 2 diabetes mellitus with unspecified complications: Secondary | ICD-10-CM | POA: Diagnosis not present

## 2021-10-28 DIAGNOSIS — M75122 Complete rotator cuff tear or rupture of left shoulder, not specified as traumatic: Secondary | ICD-10-CM | POA: Diagnosis not present

## 2021-10-31 DIAGNOSIS — M75122 Complete rotator cuff tear or rupture of left shoulder, not specified as traumatic: Secondary | ICD-10-CM | POA: Diagnosis not present

## 2021-11-04 ENCOUNTER — Ambulatory Visit (INDEPENDENT_AMBULATORY_CARE_PROVIDER_SITE_OTHER): Payer: Medicare Other

## 2021-11-04 DIAGNOSIS — M75122 Complete rotator cuff tear or rupture of left shoulder, not specified as traumatic: Secondary | ICD-10-CM | POA: Diagnosis not present

## 2021-11-04 DIAGNOSIS — E782 Mixed hyperlipidemia: Secondary | ICD-10-CM

## 2021-11-04 DIAGNOSIS — E1165 Type 2 diabetes mellitus with hyperglycemia: Secondary | ICD-10-CM

## 2021-11-04 LAB — POCT GLYCOSYLATED HEMOGLOBIN (HGB A1C): Hemoglobin A1C: 9.6 % — AB (ref 4.0–5.6)

## 2021-11-04 MED ORDER — SOLIQUA 100-33 UNT-MCG/ML ~~LOC~~ SOPN: 32.0000 [IU] | PEN_INJECTOR | Freq: Every morning | SUBCUTANEOUS | 1 refills | Status: DC

## 2021-11-04 MED ORDER — EZETIMIBE 10 MG PO TABS: 10.0000 mg | ORAL_TABLET | Freq: Every day | ORAL | 1 refills | Status: DC

## 2021-11-04 MED ORDER — EMPAGLIFLOZIN 25 MG PO TABS: 25.0000 mg | ORAL_TABLET | Freq: Every day | ORAL | 1 refills | Status: DC

## 2021-11-04 MED ORDER — BD PEN NEEDLE NANO U/F 32G X 4 MM MISC: 1 refills | Status: DC

## 2021-11-04 NOTE — Progress Notes (Signed)
Pt came in for a A1C check and was 9.6.  per [name] pt will still work on increasing his [place] as discussed depending on how his BS are.  Pt started at 15 units and has increased to 32 units.  Pt also mentioned he has been having a little pain in his right side close to his waist line and also pain in the bottom top of foot.  I advised pt to try to schedule an appt next week as we will be open on 11/07/21.  Pt stated he will call back and get an appt.  Per [name] pt has a f/u in dec but she would like for him to schedule a f/u in Aug.  Pt was ok with getting appt for then

## 2021-11-10 DIAGNOSIS — M75122 Complete rotator cuff tear or rupture of left shoulder, not specified as traumatic: Secondary | ICD-10-CM | POA: Diagnosis not present

## 2021-11-16 DIAGNOSIS — M75122 Complete rotator cuff tear or rupture of left shoulder, not specified as traumatic: Secondary | ICD-10-CM | POA: Diagnosis not present

## 2021-11-16 DIAGNOSIS — M25512 Pain in left shoulder: Secondary | ICD-10-CM | POA: Diagnosis not present

## 2021-11-18 DIAGNOSIS — M75122 Complete rotator cuff tear or rupture of left shoulder, not specified as traumatic: Secondary | ICD-10-CM | POA: Diagnosis not present

## 2021-11-23 ENCOUNTER — Ambulatory Visit: Payer: Medicare Other | Admitting: Podiatry

## 2021-11-23 DIAGNOSIS — M75122 Complete rotator cuff tear or rupture of left shoulder, not specified as traumatic: Secondary | ICD-10-CM | POA: Diagnosis not present

## 2021-11-28 DIAGNOSIS — M75122 Complete rotator cuff tear or rupture of left shoulder, not specified as traumatic: Secondary | ICD-10-CM | POA: Diagnosis not present

## 2021-11-30 ENCOUNTER — Ambulatory Visit: Payer: Medicare Other | Admitting: Dermatology

## 2021-11-30 DIAGNOSIS — L821 Other seborrheic keratosis: Secondary | ICD-10-CM | POA: Diagnosis not present

## 2021-11-30 DIAGNOSIS — L814 Other melanin hyperpigmentation: Secondary | ICD-10-CM | POA: Diagnosis not present

## 2021-11-30 DIAGNOSIS — Z1283 Encounter for screening for malignant neoplasm of skin: Secondary | ICD-10-CM | POA: Diagnosis not present

## 2021-11-30 DIAGNOSIS — L570 Actinic keratosis: Secondary | ICD-10-CM

## 2021-11-30 DIAGNOSIS — L578 Other skin changes due to chronic exposure to nonionizing radiation: Secondary | ICD-10-CM

## 2021-11-30 DIAGNOSIS — D229 Melanocytic nevi, unspecified: Secondary | ICD-10-CM | POA: Diagnosis not present

## 2021-11-30 DIAGNOSIS — D692 Other nonthrombocytopenic purpura: Secondary | ICD-10-CM | POA: Diagnosis not present

## 2021-11-30 DIAGNOSIS — Z85828 Personal history of other malignant neoplasm of skin: Secondary | ICD-10-CM

## 2021-11-30 DIAGNOSIS — M75122 Complete rotator cuff tear or rupture of left shoulder, not specified as traumatic: Secondary | ICD-10-CM | POA: Diagnosis not present

## 2021-11-30 DIAGNOSIS — D1800 Hemangioma unspecified site: Secondary | ICD-10-CM

## 2021-11-30 NOTE — Progress Notes (Signed)
Follow-Up Visit   Subjective  Michael Gross is a 66 y.o. male who presents for the following: Annual Exam (History of BCC - The patient presents for Upper Body Skin Exam (UBSE) for skin cancer screening and mole check.  The patient has spots, moles and lesions to be evaluated, some may be new or changing and the patient has concerns that these could be cancer.//).  Accompanied by wife  The following portions of the chart were reviewed this encounter and updated as appropriate:   Tobacco  Allergies  Meds  Problems  Med Hx  Surg Hx  Fam Hx     Review of Systems:  No other skin or systemic complaints except as noted in HPI or Assessment and Plan.  Objective  Well appearing patient in no apparent distress; mood and affect are within normal limits.  A full examination was performed including scalp, head, eyes, ears, nose, lips, neck, chest, axillae, abdomen, back, buttocks, bilateral upper extremities, bilateral lower extremities, hands, feet, fingers, toes, fingernails, and toenails. All findings within normal limits unless otherwise noted below.  Left cheek Well healed BCC site (treated over 20 years ago)  Rigth cheek x 1, nose x 1 (2) Erythematous thin papules/macules with gritty scale.    Assessment & Plan   Purpura - Chronic; persistent and recurrent.  Treatable, but not curable. - Violaceous macules and patches - Benign - Related to trauma, age, sun damage and/or use of blood thinners, chronic use of topical and/or oral steroids - Observe - Can use OTC arnica containing moisturizer such as Dermend Bruise Formula if desired - Call for worsening or other concerns  Lentigines - Scattered tan macules - Due to sun exposure - Benign-appearing, observe - Recommend daily broad spectrum sunscreen SPF 30+ to sun-exposed areas, reapply every 2 hours as needed. - Call for any changes  Seborrheic Keratoses - Stuck-on, waxy, tan-brown papules and/or plaques  -  Benign-appearing - Discussed benign etiology and prognosis. - Observe - Call for any changes  Melanocytic Nevi - Tan-brown and/or pink-flesh-colored symmetric macules and papules - Benign appearing on exam today - Observation - Call clinic for new or changing moles - Recommend daily use of broad spectrum spf 30+ sunscreen to sun-exposed areas.   Hemangiomas - Red papules - Discussed benign nature - Observe - Call for any changes  Actinic Damage - Chronic condition, secondary to cumulative UV/sun exposure - diffuse scaly erythematous macules with underlying dyspigmentation - Recommend daily broad spectrum sunscreen SPF 30+ to sun-exposed areas, reapply every 2 hours as needed.  - Staying in the shade or wearing long sleeves, sun glasses (UVA+UVB protection) and wide brim hats (4-inch brim around the entire circumference of the hat) are also recommended for sun protection.  - Call for new or changing lesions.  Skin cancer screening performed today.  History of basal cell carcinoma (BCC) Left cheek Clear. Observe for recurrence. Call clinic for new or changing lesions.  Recommend regular skin exams, daily broad-spectrum spf 30+ sunscreen use, and photoprotection.     AK (actinic keratosis) (2) Rigth cheek x 1, nose x 1 Destruction of lesion - Rigth cheek x 1, nose x 1 Complexity: simple   Destruction method: cryotherapy   Informed consent: discussed and consent obtained   Timeout:  patient name, date of birth, surgical site, and procedure verified Lesion destroyed using liquid nitrogen: Yes   Region frozen until ice ball extended beyond lesion: Yes   Outcome: patient tolerated procedure well with no complications  Post-procedure details: wound care instructions given    Return in about 1 year (around 12/01/2022) for UBSE.  I, Ashok Cordia, CMA, am acting as scribe for Sarina Ser, MD . Documentation: I have reviewed the above documentation for accuracy and completeness,  and I agree with the above.  Sarina Ser, MD

## 2021-11-30 NOTE — Patient Instructions (Addendum)
Cryotherapy Aftercare  Wash gently with soap and water everyday.   Apply Vaseline and Band-Aid daily until healed.     Due to recent changes in healthcare laws, you may see results of your pathology and/or laboratory studies on MyChart before the doctors have had a chance to review them. We understand that in some cases there may be results that are confusing or concerning to you. Please understand that not all results are received at the same time and often the doctors may need to interpret multiple results in order to provide you with the best plan of care or course of treatment. Therefore, we ask that you please give us 2 business days to thoroughly review all your results before contacting the office for clarification. Should we see a critical lab result, you will be contacted sooner.   If You Need Anything After Your Visit  If you have any questions or concerns for your doctor, please call our main line at [phone] and press option 4 to reach your doctor's medical assistant. If no one answers, please leave a voicemail as directed and we will return your call as soon as possible. Messages left after 4 pm will be answered the following business day.   You may also send us a message via MyChart. We typically respond to MyChart messages within 1-2 business days.  For prescription refills, please ask your pharmacy to contact our office. Our fax number is [phone].  If you have an urgent issue when the clinic is closed that cannot wait until the next business day, you can page your doctor at the number below.    Please note that while we do our best to be available for urgent issues outside of office hours, we are not available 24/7.   If you have an urgent issue and are unable to reach us, you may choose to seek medical care at your doctor's office, retail clinic, urgent care center, or emergency room.  If you have a medical emergency, please immediately call 911 or go to the  emergency department.  Pager Numbers  - Dr. Kowalski: [phone]  - Dr. Moye: [phone]  - Dr. Stewart: [phone]  In the event of inclement weather, please call our main line at [phone] for an update on the status of any delays or closures.  Dermatology Medication Tips: Please keep the boxes that topical medications come in in order to help keep track of the instructions about where and how to use these. Pharmacies typically print the medication instructions only on the boxes and not directly on the medication tubes.   If your medication is too expensive, please contact our office at [phone] option 4 or send us a message through MyChart.   We are unable to tell what your co-pay for medications will be in advance as this is different depending on your insurance coverage. However, we may be able to find a substitute medication at lower cost or fill out paperwork to get insurance to cover a needed medication.   If a prior authorization is required to get your medication covered by your insurance company, please allow us 1-2 business days to complete this process.  Drug prices often vary depending on where the prescription is filled and some pharmacies may offer cheaper prices.  The website www.goodrx.com contains coupons for medications through different pharmacies. The prices here do not account for what the cost may be with help from insurance (it may be cheaper with your insurance), but the website can   give you the price if you did not use any insurance.  - You can print the associated coupon and take it with your prescription to the pharmacy.  - You may also stop by our office during regular business hours and pick up a GoodRx coupon card.  - If you need your prescription sent electronically to a different pharmacy, notify our office through [facility] MyChart or by phone at [phone] option 4.     Si Usted Necesita Algo Despus de Su Visita  Tambin puede  enviarnos un mensaje a travs de MyChart. Por lo general respondemos a los mensajes de MyChart en el transcurso de 1 a 2 das hbiles.  Para renovar recetas, por favor pida a su farmacia que se ponga en contacto con nuestra oficina. Nuestro nmero de fax es el [phone].  Si tiene un asunto urgente cuando la clnica est cerrada y que no puede esperar hasta el siguiente da hbil, puede llamar/localizar a su doctor(a) al nmero que aparece a continuacin.   Por favor, tenga en cuenta que aunque hacemos todo lo posible para estar disponibles para asuntos urgentes fuera del horario de oficina, no estamos disponibles las 24 horas del da, los 7 das de la semana.   Si tiene un problema urgente y no puede comunicarse con nosotros, puede optar por buscar atencin mdica  en el consultorio de su doctor(a), en una clnica privada, en un centro de atencin urgente o en una sala de emergencias.  Si tiene una emergencia mdica, por favor llame inmediatamente al 911 o vaya a la sala de emergencias.  Nmeros de bper  - Dr. Kowalski: [phone]  - Dra. Moye: [phone]  - Dra. Stewart: [phone]  En caso de inclemencias del tiempo, por favor llame a nuestra lnea principal al [phone] para una actualizacin sobre el estado de cualquier retraso o cierre.  Consejos para la medicacin en dermatologa: Por favor, guarde las cajas en las que vienen los medicamentos de uso tpico para ayudarle a seguir las instrucciones sobre dnde y cmo usarlos. Las farmacias generalmente imprimen las instrucciones del medicamento slo en las cajas y no directamente en los tubos del medicamento.   Si su medicamento es muy caro, por favor, pngase en contacto con nuestra oficina llamando al [phone] y presione la opcin 4 o envenos un mensaje a travs de MyChart.   No podemos decirle cul ser su copago por los medicamentos por adelantado ya que esto es diferente dependiendo de la cobertura de su seguro.  Sin embargo, es posible que podamos encontrar un medicamento sustituto a menor costo o llenar un formulario para que el seguro cubra el medicamento que se considera necesario.   Si se requiere una autorizacin previa para que su compaa de seguros cubra su medicamento, por favor permtanos de 1 a 2 das hbiles para completar este proceso.  Los precios de los medicamentos varan con frecuencia dependiendo del lugar de dnde se surte la receta y alguna farmacias pueden ofrecer precios ms baratos.  El sitio web www.goodrx.com tiene cupones para medicamentos de diferentes farmacias. Los precios aqu no tienen en cuenta lo que podra costar con la ayuda del seguro (puede ser ms barato con su seguro), pero el sitio web puede darle el precio si no utiliz ningn seguro.  - Puede imprimir el cupn correspondiente y llevarlo con su receta a la farmacia.  - Tambin puede pasar por nuestra oficina durante el horario de atencin regular y recoger una tarjeta de cupones de GoodRx.  -   Si necesita que su receta se enve electrnicamente a una farmacia diferente, informe a nuestra oficina a travs de MyChart de [place] o por telfono llamando al [phone] y presione la opcin 4.  

## 2021-12-01 ENCOUNTER — Other Ambulatory Visit: Payer: Self-pay | Admitting: Nurse Practitioner

## 2021-12-01 ENCOUNTER — Telehealth: Payer: Self-pay

## 2021-12-01 DIAGNOSIS — E782 Mixed hyperlipidemia: Secondary | ICD-10-CM

## 2021-12-01 DIAGNOSIS — G629 Polyneuropathy, unspecified: Secondary | ICD-10-CM | POA: Diagnosis not present

## 2021-12-01 DIAGNOSIS — M79671 Pain in right foot: Secondary | ICD-10-CM | POA: Diagnosis not present

## 2021-12-01 DIAGNOSIS — G479 Sleep disorder, unspecified: Secondary | ICD-10-CM | POA: Diagnosis not present

## 2021-12-01 DIAGNOSIS — R251 Tremor, unspecified: Secondary | ICD-10-CM | POA: Diagnosis not present

## 2021-12-01 DIAGNOSIS — R29898 Other symptoms and signs involving the musculoskeletal system: Secondary | ICD-10-CM | POA: Diagnosis not present

## 2021-12-01 NOTE — Telephone Encounter (Signed)
Neurology appointment>>12/01/21-Toni

## 2021-12-04 ENCOUNTER — Encounter: Payer: Self-pay | Admitting: Dermatology

## 2021-12-05 DIAGNOSIS — M75122 Complete rotator cuff tear or rupture of left shoulder, not specified as traumatic: Secondary | ICD-10-CM | POA: Diagnosis not present

## 2021-12-19 DIAGNOSIS — M75122 Complete rotator cuff tear or rupture of left shoulder, not specified as traumatic: Secondary | ICD-10-CM | POA: Diagnosis not present

## 2021-12-26 DIAGNOSIS — M75122 Complete rotator cuff tear or rupture of left shoulder, not specified as traumatic: Secondary | ICD-10-CM | POA: Diagnosis not present

## 2021-12-28 DIAGNOSIS — M75122 Complete rotator cuff tear or rupture of left shoulder, not specified as traumatic: Secondary | ICD-10-CM | POA: Diagnosis not present

## 2022-01-03 DIAGNOSIS — M75122 Complete rotator cuff tear or rupture of left shoulder, not specified as traumatic: Secondary | ICD-10-CM | POA: Diagnosis not present

## 2022-01-25 DIAGNOSIS — M75122 Complete rotator cuff tear or rupture of left shoulder, not specified as traumatic: Secondary | ICD-10-CM | POA: Diagnosis not present

## 2022-01-31 DIAGNOSIS — M79671 Pain in right foot: Secondary | ICD-10-CM | POA: Diagnosis not present

## 2022-01-31 DIAGNOSIS — G629 Polyneuropathy, unspecified: Secondary | ICD-10-CM | POA: Diagnosis not present

## 2022-01-31 DIAGNOSIS — R29898 Other symptoms and signs involving the musculoskeletal system: Secondary | ICD-10-CM | POA: Diagnosis not present

## 2022-01-31 DIAGNOSIS — R251 Tremor, unspecified: Secondary | ICD-10-CM | POA: Diagnosis not present

## 2022-02-01 ENCOUNTER — Other Ambulatory Visit: Payer: Self-pay | Admitting: Nurse Practitioner

## 2022-02-02 ENCOUNTER — Other Ambulatory Visit: Payer: Self-pay | Admitting: Internal Medicine

## 2022-02-02 DIAGNOSIS — E1165 Type 2 diabetes mellitus with hyperglycemia: Secondary | ICD-10-CM

## 2022-02-15 DIAGNOSIS — M75122 Complete rotator cuff tear or rupture of left shoulder, not specified as traumatic: Secondary | ICD-10-CM | POA: Diagnosis not present

## 2022-02-21 DIAGNOSIS — M75122 Complete rotator cuff tear or rupture of left shoulder, not specified as traumatic: Secondary | ICD-10-CM | POA: Diagnosis not present

## 2022-03-01 DIAGNOSIS — M75122 Complete rotator cuff tear or rupture of left shoulder, not specified as traumatic: Secondary | ICD-10-CM | POA: Diagnosis not present

## 2022-03-07 DIAGNOSIS — M75122 Complete rotator cuff tear or rupture of left shoulder, not specified as traumatic: Secondary | ICD-10-CM | POA: Diagnosis not present

## 2022-03-14 DIAGNOSIS — M75122 Complete rotator cuff tear or rupture of left shoulder, not specified as traumatic: Secondary | ICD-10-CM | POA: Diagnosis not present

## 2022-03-22 DIAGNOSIS — M75122 Complete rotator cuff tear or rupture of left shoulder, not specified as traumatic: Secondary | ICD-10-CM | POA: Diagnosis not present

## 2022-03-22 DIAGNOSIS — M25512 Pain in left shoulder: Secondary | ICD-10-CM | POA: Diagnosis not present

## 2022-03-28 ENCOUNTER — Ambulatory Visit: Payer: Medicare Other | Admitting: Nurse Practitioner

## 2022-03-28 DIAGNOSIS — M75122 Complete rotator cuff tear or rupture of left shoulder, not specified as traumatic: Secondary | ICD-10-CM | POA: Diagnosis not present

## 2022-03-29 DIAGNOSIS — M25871 Other specified joint disorders, right ankle and foot: Secondary | ICD-10-CM | POA: Diagnosis not present

## 2022-04-05 DIAGNOSIS — M75122 Complete rotator cuff tear or rupture of left shoulder, not specified as traumatic: Secondary | ICD-10-CM | POA: Diagnosis not present

## 2022-04-13 ENCOUNTER — Ambulatory Visit (INDEPENDENT_AMBULATORY_CARE_PROVIDER_SITE_OTHER): Payer: Medicare Other | Admitting: Nurse Practitioner

## 2022-04-13 ENCOUNTER — Encounter: Payer: Self-pay | Admitting: Nurse Practitioner

## 2022-04-13 VITALS — BP 124/73 | HR 82 | Temp 97.1°F | Resp 16 | Ht 75.0 in | Wt 205.2 lb

## 2022-04-13 DIAGNOSIS — E1165 Type 2 diabetes mellitus with hyperglycemia: Secondary | ICD-10-CM

## 2022-04-13 DIAGNOSIS — Z0001 Encounter for general adult medical examination with abnormal findings: Secondary | ICD-10-CM

## 2022-04-13 DIAGNOSIS — Z760 Encounter for issue of repeat prescription: Secondary | ICD-10-CM

## 2022-04-13 DIAGNOSIS — Z0000 Encounter for general adult medical examination without abnormal findings: Secondary | ICD-10-CM

## 2022-04-13 DIAGNOSIS — M21371 Foot drop, right foot: Secondary | ICD-10-CM

## 2022-04-13 DIAGNOSIS — E782 Mixed hyperlipidemia: Secondary | ICD-10-CM

## 2022-04-13 LAB — POCT GLYCOSYLATED HEMOGLOBIN (HGB A1C): Hemoglobin A1C: 10.7 % — AB (ref 4.0–5.6)

## 2022-04-13 MED ORDER — GABAPENTIN 300 MG PO CAPS: 300.0000 mg | ORAL_CAPSULE | Freq: Every day | ORAL | 3 refills | Status: DC

## 2022-04-13 MED ORDER — BD PEN NEEDLE NANO U/F 32G X 4 MM MISC: 1 refills | Status: DC

## 2022-04-13 MED ORDER — EMPAGLIFLOZIN 25 MG PO TABS: 25.0000 mg | ORAL_TABLET | Freq: Every day | ORAL | 1 refills | Status: DC

## 2022-04-13 MED ORDER — EZETIMIBE 10 MG PO TABS: 10.0000 mg | ORAL_TABLET | Freq: Every day | ORAL | 1 refills | Status: DC

## 2022-04-13 MED ORDER — TAMSULOSIN HCL 0.4 MG PO CAPS: 0.8000 mg | ORAL_CAPSULE | Freq: Every day | ORAL | 1 refills | Status: DC

## 2022-04-13 NOTE — Progress Notes (Signed)
[facility], [place] [address]  Internal MEDICINE  Office Visit Note  Patient Name: Michael Gross  [id number]  [id number]  Date of Service: 04/13/2022  Chief Complaint  Patient presents with   Medicare Wellness   Diabetes   Hyperlipidemia    HPI Michael Gross presents for an annual well visit and physical exam.  Well-appearing 66 y.o. male with diabetes, high cholesterol, and neuropathy.  Routine CRC screening: last done in 2019 Eye exam: done in February this year foot exam: done in January this year -- sees podiatry Labs: due now New or worsening pain: none Other concerns: none A1c is 10.7 today. Wants to switch back to ozempic or try [employer] is changing in January  Needs refills of all of his medications       04/13/2022    3:09 PM  MMSE - Mini Mental State Exam  Orientation to time 5  Orientation to Place 5  Registration 3  Attention/ Calculation 5  Recall 3  Language- name 2 objects 2  Language- repeat 1  Language- follow 3 step command 3  Language- read & follow direction 1  Write a sentence 1  Copy design 1  Total score 30    Functional Status Survey: Is the patient deaf or have difficulty hearing?: No Does the patient have difficulty seeing, even when wearing glasses/contacts?: No Does the patient have difficulty concentrating, remembering, or making decisions?: Yes Does the patient have difficulty walking or climbing stairs?: No Does the patient have difficulty dressing or bathing?: No Does the patient have difficulty doing errands alone such as visiting a doctor's office or shopping?: No     09/08/2020    3:27 PM 02/17/2021   11:56 AM 07/06/2021    8:34 AM 09/21/2021    9:09 AM 04/13/2022    3:07 PM  Fall Risk  Falls in the past year? 0 0 _0 Was there an injury with Fall?   _1 Fall Risk Category Calculator   _2 Fall Risk Category   Moderate Moderate Moderate  Patient Fall Risk Level  Low  fall risk Moderate fall risk Moderate fall risk Moderate fall risk  Patient at Risk for Falls Due to  No Fall Risks     Fall risk Follow up  Falls evaluation completed Falls evaluation completed Falls evaluation completed Falls evaluation completed       04/13/2022    3:07 PM  Depression screen PHQ 2/9  Decreased Interest 0  Down, Depressed, Hopeless 0  PHQ - 2 Score 0        Current Medication: Outpatient Encounter Medications as of 04/13/2022  Medication Sig   Ascorbic Acid (VITAMIN C PO) Take by mouth.   Cholecalciferol (VITAMIN D3 PO) Take by mouth.   Cyanocobalamin (VITAMIN B-12 PO) Take by mouth.   FIBER SELECT GUMMIES PO Take 2 tablets by mouth daily.   gabapentin (NEURONTIN) 300 MG capsule Take 1 capsule (300 mg total) by mouth at bedtime.   Lancets 30G MISC Use to check blood sugars 5 times per day   Multiple Vitamin (MULTIVITAMIN WITH MINERALS) TABS tablet Take 1 tablet by mouth daily.   mupirocin ointment (BACTROBAN) 2 % Apply 1 application. topically 2 (two) times daily.   rosuvastatin (CRESTOR) 40 MG tablet TAKE 1 TABLET BY MOUTH EVERY DAY   triamcinolone cream (KENALOG) 0.1 % Apply 1 application. topically 2 (two) times daily.   [DISCONTINUED] empagliflozin (JARDIANCE)  25 MG TABS tablet Take 1 tablet (25 mg total) by mouth daily. E11.65   [DISCONTINUED] ezetimibe (ZETIA) 10 MG tablet Take 1 tablet (10 mg total) by mouth daily.   [DISCONTINUED] gabapentin (NEURONTIN) 100 MG capsule TAKE 1 CAPSULE BY MOUTH EVERY MORNING AND EARLY AFTERNOON.   [DISCONTINUED] glimepiride (AMARYL) 4 MG tablet TAKE 1 TABLET(4 MG) BY MOUTH TWICE DAILY   [DISCONTINUED] Insulin Pen Needle (BD PEN NEEDLE NANO U/F) 32G X 4 MM MISC Use as directed with SOLIQUA to inject insulin every morning E11.65   [DISCONTINUED] SOLIQUA 100-33 UNT-MCG/ML SOPN INJECT 32 UNITS INTO THE SKIN EVERY MORNING. ON EMPTY STOMACH E11.65   [DISCONTINUED] tamsulosin (FLOMAX) 0.4 MG CAPS capsule TAKE 2 CAPSULES BY MOUTH  EVERY DAY   empagliflozin (JARDIANCE) 25 MG TABS tablet Take 1 tablet (25 mg total) by mouth daily. E11.65   ezetimibe (ZETIA) 10 MG tablet Take 1 tablet (10 mg total) by mouth daily.   Insulin Pen Needle (BD PEN NEEDLE NANO U/F) 32G X 4 MM MISC Use as directed with SOLIQUA to inject insulin every morning E11.65   tamsulosin (FLOMAX) 0.4 MG CAPS capsule Take 2 capsules (0.8 mg total) by mouth daily.   No facility-administered encounter medications on file as of 04/13/2022.    Surgical History: Past Surgical History:  Procedure Laterality Date   HERNIA REPAIR     2003   [place]   MOHS SURGERY     2003   SHOULDER ARTHROSCOPY WITH OPEN ROTATOR CUFF REPAIR AND DISTAL CLAVICLE ACROMINECTOMY Left 08/11/2021   Procedure: SHOULDER ARTHROSCOPY WITH OPEN ROTATOR CUFF REPAIR AND DISTAL CLAVICLE ACROMINECTOMY;  Surgeon: Thornton Park, MD;  Location: [facility];  Service: Orthopedics;  Laterality: Left;    Medical History: Past Medical History:  Diagnosis Date   Basal cell carcinoma    BPH (benign prostatic hyperplasia)    Diabetes ([place])    Hyperlipidemia     Family History: Family History  Problem Relation Age of Onset   Heart disease Father    Prostate cancer Neg Hx    Kidney cancer Neg Hx    Bladder Cancer Neg Hx     Social History   Socioeconomic History   Marital status: Married    Spouse name: [name]   Number of children: 4   Years of education: Not on file   Highest education level: Not on file  Occupational History   Not on file  Tobacco Use   Smoking status: Never   Smokeless tobacco: Never  Vaping Use   Vaping Use: Never used  Substance and Sexual Activity   Alcohol use: Yes    Comment: rare   Drug use: No   Sexual activity: Yes  Other Topics Concern   Not on file  Social History Narrative   Not on file   Social Determinants of Health   Financial Resource Strain: Not on file  Food Insecurity: Not on file  Transportation Needs: Not on file   Physical Activity: Not on file  Stress: Not on file  Social Connections: Not on file  Intimate Partner Violence: Not on file      Review of Systems  Constitutional:  Negative for activity change, appetite change, chills, fatigue, fever and unexpected weight change.  HENT: Negative.  Negative for congestion, ear pain, rhinorrhea, sore throat and trouble swallowing.   Eyes: Negative.   Respiratory: Negative.  Negative for cough, chest tightness, shortness of breath and wheezing.   Cardiovascular: Negative.  Negative for chest pain.  Gastrointestinal: Negative.  Negative for abdominal pain, blood in stool, constipation, diarrhea, nausea and vomiting.  Endocrine: Negative.   Genitourinary: Negative.  Negative for difficulty urinating, dysuria, frequency, hematuria and urgency.  Musculoskeletal:  Positive for arthralgias and gait problem. Negative for back pain, joint swelling, myalgias and neck pain.  Skin: Negative.  Negative for rash and wound.  Allergic/Immunologic: Negative.  Negative for immunocompromised state.  Neurological:  Negative for dizziness, seizures, numbness and headaches.  Hematological: Negative.   Psychiatric/Behavioral: Negative.  Negative for behavioral problems, self-injury and suicidal ideas. The patient is not nervous/anxious.     Vital Signs: BP 124/73   Pulse 82   Temp (!) 97.1 F (36.2 C)   Resp 16   Ht _0  (1.905 m)   Wt 205 lb 3.2 oz (93.1 kg)   SpO2 94%   BMI 25.65 kg/m    Physical Exam Vitals reviewed.  Constitutional:      General: He is not in acute distress.    Appearance: Normal appearance. He is well-developed. He is obese. He is not ill-appearing or diaphoretic.  HENT:     Head: Normocephalic and atraumatic.     Right Ear: Tympanic membrane, ear canal and external ear normal.     Left Ear: Tympanic membrane, ear canal and external ear normal.     Nose: Nose normal. No congestion or rhinorrhea.     Mouth/Throat:     Mouth: Mucous  membranes are moist.     Pharynx: Oropharynx is clear. No oropharyngeal exudate or posterior oropharyngeal erythema.  Eyes:     General: No scleral icterus.       Right eye: No discharge.        Left eye: No discharge.     Extraocular Movements: Extraocular movements intact.     Conjunctiva/sclera: Conjunctivae normal.     Pupils: Pupils are equal, round, and reactive to light.  Neck:     Thyroid: No thyromegaly.     Vascular: No JVD.     Trachea: No tracheal deviation.  Cardiovascular:     Rate and Rhythm: Normal rate and regular rhythm.     Pulses: Normal pulses.          Dorsalis pedis pulses are 2+ on the right side and 2+ on the left side.       Posterior tibial pulses are 2+ on the right side and 2+ on the left side.     Heart sounds: Normal heart sounds. No murmur heard.    No friction rub. No gallop.  Pulmonary:     Effort: Pulmonary effort is normal. No respiratory distress.     Breath sounds: Normal breath sounds. No stridor. No wheezing or rales.  Chest:     Chest wall: No tenderness.  Abdominal:     General: Bowel sounds are normal. There is no distension.     Palpations: Abdomen is soft. There is no mass.     Tenderness: There is no abdominal tenderness. There is no guarding or rebound.  Musculoskeletal:        General: No tenderness or deformity. Normal range of motion.     Cervical back: Normal range of motion and neck supple.     Right foot: Normal range of motion. Bunion present. No deformity.     Left foot: Bunion present.  Feet:     Right foot:     Protective Sensation: 6 sites tested.  6 sites sensed.  Skin integrity: Callus and dry skin present.     Left foot:     Protective Sensation: 6 sites tested.  6 sites sensed.     Skin integrity: Callus and dry skin present.  Lymphadenopathy:     Cervical: No cervical adenopathy.  Skin:    General: Skin is warm and dry.     Capillary Refill: Capillary refill takes less than 2 seconds.     Coloration: Skin  is not pale.     Findings: No erythema or rash.  Neurological:     Mental Status: He is alert and oriented to person, place, and time.     Cranial Nerves: No cranial nerve deficit.     Motor: No abnormal muscle tone.     Coordination: Coordination normal.     Gait: Gait normal.     Deep Tendon Reflexes: Reflexes are normal and symmetric.  Psychiatric:        Mood and Affect: Mood normal.        Behavior: Behavior normal.        Thought Content: Thought content normal.        Judgment: Judgment normal.        Assessment/Plan: 1. Encounter for general adult medical examination with abnormal findings Age-appropriate preventive screenings and vaccinations discussed, annual physical exam completed. Routine labs for health maintenance ordered, see below. PHM updated.  - CBC with Differential/Platelet - CMP14+EGFR - Lipid Profile  2. Uncontrolled type 2 diabetes mellitus with hyperglycemia (HCC) A1c is elevated 10.7 today, discussed diet modifications again, ozempic sample provided, will switch to ozempic or mounjaro in January whichever insurance prefers. Discontinue soliqua.  - POCT glycosylated hemoglobin (Hb A1C) - CBC with Differential/Platelet - CMP14+EGFR - empagliflozin (JARDIANCE) 25 MG TABS tablet; Take 1 tablet (25 mg total) by mouth daily. E11.65  Dispense: 90 tablet; Refill: 1  3. Acquired right foot drop Sees podiatry, no change  4. Mixed hyperlipidemia Repeat lipid panel -- continue rosuvastatin and ezetimibe as prescribed - Lipid Profile  5. Medication refill - ezetimibe (ZETIA) 10 MG tablet; Take 1 tablet (10 mg total) by mouth daily.  Dispense: 90 tablet; Refill: 1 - empagliflozin (JARDIANCE) 25 MG TABS tablet; Take 1 tablet (25 mg total) by mouth daily. E11.65  Dispense: 90 tablet; Refill: 1 - gabapentin (NEURONTIN) 300 MG capsule; Take 1 capsule (300 mg total) by mouth at bedtime.  Dispense: 90 capsule; Refill: 3 - Insulin Pen Needle (BD PEN NEEDLE NANO U/F)  32G X 4 MM MISC; Use as directed with SOLIQUA to inject insulin every morning E11.65  Dispense: 300 each; Refill: 1 - tamsulosin (FLOMAX) 0.4 MG CAPS capsule; Take 2 capsules (0.8 mg total) by mouth daily.  Dispense: 180 capsule; Refill: 1      General Counseling: Michael Gross understanding of the findings of todays visit and agrees with plan of treatment. I have discussed any further diagnostic evaluation that may be needed or ordered today. We also reviewed his medications today. he has been encouraged to call the office with any questions or concerns that should arise related to todays visit.    Orders Placed This Encounter  Procedures   CBC with Differential/Platelet   CMP14+EGFR   Lipid Profile   POCT glycosylated hemoglobin (Hb A1C)    Meds ordered this encounter  Medications   ezetimibe (ZETIA) 10 MG tablet    Sig: Take 1 tablet (10 mg total) by mouth daily.    Dispense:  90 tablet    Refill:  1   empagliflozin (JARDIANCE) 25 MG TABS tablet    Sig: Take 1 tablet (25 mg total) by mouth daily. E11.65    Dispense:  90 tablet    Refill:  1   gabapentin (NEURONTIN) 300 MG capsule    Sig: Take 1 capsule (300 mg total) by mouth at bedtime.    Dispense:  90 capsule    Refill:  3   Insulin Pen Needle (BD PEN NEEDLE NANO U/F) 32G X 4 MM MISC    Sig: Use as directed with SOLIQUA to inject insulin every morning E11.65    Dispense:  300 each    Refill:  1    Fill for 90 days   tamsulosin (FLOMAX) 0.4 MG CAPS capsule    Sig: Take 2 capsules (0.8 mg total) by mouth daily.    Dispense:  180 capsule    Refill:  1    Return in about 3 months (around 07/19/2022) for F/U, Labs, Apryl Brymer PCP. Will get labs in January and discuss in march.    Total time spent:30 Minutes Time spent includes review of chart, medications, test results, and follow up plan with the patient.   [place] Controlled Substance Database was reviewed by me.  This patient was seen by Jonetta Osgood, FNP-C in  collaboration with Dr. Clayborn Bigness as a part of collaborative care agreement.  Dorthula Bier R. Valetta Fuller, MSN, FNP-C Internal medicine

## 2022-04-14 ENCOUNTER — Encounter: Payer: Self-pay | Admitting: Nurse Practitioner

## 2022-04-18 ENCOUNTER — Telehealth: Payer: Self-pay | Admitting: Nurse Practitioner

## 2022-04-18 NOTE — Telephone Encounter (Signed)
04/13/22 A1c results faxed to [facility]; [phone]

## 2022-05-04 DIAGNOSIS — M75122 Complete rotator cuff tear or rupture of left shoulder, not specified as traumatic: Secondary | ICD-10-CM | POA: Diagnosis not present

## 2022-06-29 ENCOUNTER — Other Ambulatory Visit: Payer: Self-pay | Admitting: Nurse Practitioner

## 2022-07-05 ENCOUNTER — Other Ambulatory Visit: Payer: Self-pay | Admitting: Nurse Practitioner

## 2022-07-05 ENCOUNTER — Telehealth: Payer: Self-pay

## 2022-07-05 DIAGNOSIS — E1165 Type 2 diabetes mellitus with hyperglycemia: Secondary | ICD-10-CM

## 2022-07-05 MED ORDER — SEMAGLUTIDE(0.25 OR 0.5MG/DOS) 2 MG/3ML ~~LOC~~ SOPN: 0.5000 mg | PEN_INJECTOR | SUBCUTANEOUS | 1 refills | Status: DC

## 2022-07-05 NOTE — Telephone Encounter (Signed)
sent 

## 2022-07-05 NOTE — Telephone Encounter (Signed)
Lmom that we send med °

## 2022-07-07 DIAGNOSIS — I700 Atherosclerosis of aorta: Secondary | ICD-10-CM

## 2022-07-07 HISTORY — DX: Atherosclerosis of aorta: I70.0

## 2022-07-07 LAB — CMP14+EGFR
ALT: 43 IU/L (ref 0–44)
AST: 32 IU/L (ref 0–40)
Albumin/Globulin Ratio: 1.8 (ref 1.2–2.2)
Albumin: 4.2 g/dL (ref 3.9–4.9)
Alkaline Phosphatase: 70 IU/L (ref 44–121)
BUN/Creatinine Ratio: 17 (ref 10–24)
BUN: 13 mg/dL (ref 8–27)
Bilirubin Total: 0.6 mg/dL (ref 0.0–1.2)
CO2: 22 mmol/L (ref 20–29)
Calcium: 9.2 mg/dL (ref 8.6–10.2)
Chloride: 103 mmol/L (ref 96–106)
Creatinine, Ser: 0.75 mg/dL — ABNORMAL LOW (ref 0.76–1.27)
Globulin, Total: 2.3 g/dL (ref 1.5–4.5)
Glucose: 156 mg/dL — ABNORMAL HIGH (ref 70–99)
Potassium: 4.6 mmol/L (ref 3.5–5.2)
Sodium: 142 mmol/L (ref 134–144)
Total Protein: 6.5 g/dL (ref 6.0–8.5)
eGFR: 100 mL/min/{1.73_m2} (ref >59)

## 2022-07-07 LAB — CBC WITH DIFFERENTIAL/PLATELET
Basophils Absolute: 0.1 10*3/uL (ref 0.0–0.2)
Basos: 1 %
EOS (ABSOLUTE): 0.1 10*3/uL (ref 0.0–0.4)
Eos: 2 %
Hematocrit: 50.6 % (ref 37.5–51.0)
Hemoglobin: 16.6 g/dL (ref 13.0–17.7)
Immature Grans (Abs): 0 10*3/uL (ref 0.0–0.1)
Immature Granulocytes: 0 %
Lymphocytes Absolute: 2.6 10*3/uL (ref 0.7–3.1)
Lymphs: 45 %
MCH: 29.7 pg (ref 26.6–33.0)
MCHC: 32.8 g/dL (ref 31.5–35.7)
MCV: 91 fL (ref 79–97)
Monocytes Absolute: 0.4 10*3/uL (ref 0.1–0.9)
Monocytes: 7 %
Neutrophils Absolute: 2.7 10*3/uL (ref 1.4–7.0)
Neutrophils: 45 %
Platelets: 168 10*3/uL (ref 150–450)
RBC: 5.59 x10E6/uL (ref 4.14–5.80)
RDW: 12.9 % (ref 11.6–15.4)
WBC: 5.9 10*3/uL (ref 3.4–10.8)

## 2022-07-07 LAB — LIPID PANEL
Chol/HDL Ratio: 2.2 ratio (ref 0.0–5.0)
Cholesterol, Total: 112 mg/dL (ref 100–199)
HDL: 50 mg/dL (ref >39)
LDL Chol Calc (NIH): 47 mg/dL (ref 0–99)
Triglycerides: 71 mg/dL (ref 0–149)
VLDL Cholesterol Cal: 15 mg/dL (ref 5–40)

## 2022-07-13 ENCOUNTER — Ambulatory Visit (INDEPENDENT_AMBULATORY_CARE_PROVIDER_SITE_OTHER): Payer: Medicare Other | Admitting: Nurse Practitioner

## 2022-07-13 ENCOUNTER — Telehealth: Payer: Self-pay

## 2022-07-13 ENCOUNTER — Other Ambulatory Visit: Payer: Self-pay

## 2022-07-13 ENCOUNTER — Encounter: Payer: Self-pay | Admitting: Nurse Practitioner

## 2022-07-13 VITALS — BP 122/74 | HR 100 | Temp 97.1°F | Resp 16 | Ht 75.0 in | Wt 194.0 lb

## 2022-07-13 DIAGNOSIS — Z1212 Encounter for screening for malignant neoplasm of rectum: Secondary | ICD-10-CM

## 2022-07-13 DIAGNOSIS — E1140 Type 2 diabetes mellitus with diabetic neuropathy, unspecified: Secondary | ICD-10-CM | POA: Diagnosis not present

## 2022-07-13 DIAGNOSIS — E1165 Type 2 diabetes mellitus with hyperglycemia: Secondary | ICD-10-CM | POA: Diagnosis not present

## 2022-07-13 DIAGNOSIS — N401 Enlarged prostate with lower urinary tract symptoms: Secondary | ICD-10-CM | POA: Diagnosis not present

## 2022-07-13 DIAGNOSIS — Z1211 Encounter for screening for malignant neoplasm of colon: Secondary | ICD-10-CM | POA: Diagnosis not present

## 2022-07-13 DIAGNOSIS — R351 Nocturia: Secondary | ICD-10-CM

## 2022-07-13 DIAGNOSIS — Z125 Encounter for screening for malignant neoplasm of prostate: Secondary | ICD-10-CM

## 2022-07-13 LAB — POCT GLYCOSYLATED HEMOGLOBIN (HGB A1C): Hemoglobin A1C: 10.8 % — AB (ref 4.0–5.6)

## 2022-07-13 MED ORDER — PREGABALIN 25 MG PO CAPS: 25.0000 mg | ORAL_CAPSULE | Freq: Two times a day (BID) | ORAL | 2 refills | Status: DC

## 2022-07-13 MED ORDER — NA SULFATE-K SULFATE-MG SULF 17.5-3.13-1.6 GM/177ML PO SOLN: 1.0000 | Freq: Once | ORAL | 0 refills | Status: AC

## 2022-07-13 NOTE — Progress Notes (Signed)
[facility] [place], [place] [address]  Internal MEDICINE  Office Visit Note  Patient Name: Michael Gross  [id number]  [id number]  Date of Service: 07/29/2022  Chief Complaint  Patient presents with   Hyperlipidemia   Diabetes   Follow-up    Review labs    HPI [name] presents for a follow-up visit for diabetes, urinary issues, routine screenings, arthritis and neuropathy Arthritis -- has chronic arthritis, takes meloxicam Due for CRC screening -- wants to get colonoscopy Stopped gabapentin for swelling which improved, still having pins and needles. --wants to try something else Diabetes -- A1c is 10.8 still, on ozempic 0.5 mg and jardiance 25 mg daily. Working on diet modifications as well.  Hx of BPH with worsening symptoms recently -- increased nocturia, weak urine stream, split urine stream and urinary hesitancy.     Current Medication: No facility-administered encounter medications on file as of 07/13/2022.   Outpatient Encounter Medications as of 07/13/2022  Medication Sig   Ascorbic Acid (VITAMIN C PO) Take by mouth.   Cholecalciferol (VITAMIN D3 PO) Take by mouth.   Cyanocobalamin (VITAMIN B-12 PO) Take by mouth.   empagliflozin (JARDIANCE) 25 MG TABS tablet Take 1 tablet (25 mg total) by mouth daily. E11.65   ezetimibe (ZETIA) 10 MG tablet Take 1 tablet (10 mg total) by mouth daily.   Insulin Pen Needle (BD PEN NEEDLE NANO U/F) 32G X 4 MM MISC Use as directed with SOLIQUA to inject insulin every morning E11.65   Lancets 30G MISC Use to check blood sugars 5 times per day   Multiple Vitamin (MULTIVITAMIN WITH MINERALS) TABS tablet Take 1 tablet by mouth daily.   mupirocin ointment (BACTROBAN) 2 % Apply 1 application. topically 2 (two) times daily.   pregabalin (LYRICA) 25 MG capsule Take 1 capsule (25 mg total) by mouth 2 (two) times daily.   rosuvastatin (CRESTOR) 40 MG tablet TAKE 1 TABLET BY MOUTH EVERY DAY   Semaglutide,0.25 or  0.5MG /DOS, 2 MG/3ML SOPN Inject 0.5 mg into the skin once a week.   tamsulosin (FLOMAX) 0.4 MG CAPS capsule Take 2 capsules (0.8 mg total) by mouth daily.   triamcinolone cream (KENALOG) 0.1 % Apply 1 application. topically 2 (two) times daily.   [DISCONTINUED] FIBER SELECT GUMMIES PO Take 2 tablets by mouth daily.   [DISCONTINUED] gabapentin (NEURONTIN) 300 MG capsule Take 1 capsule (300 mg total) by mouth at bedtime. (Patient not taking: Reported on 07/18/2022)    Surgical History: Past Surgical History:  Procedure Laterality Date   COLONOSCOPY WITH PROPOFOL N/A 07/27/2022   Procedure: COLONOSCOPY WITH PROPOFOL;  Surgeon: Lin Landsman, MD;  Location: [place];  Service: Endoscopy;  Laterality: N/A;  Diabetic   HERNIA REPAIR     2003   KNEE SURGERY     1998   MOHS SURGERY     2003   SHOULDER ARTHROSCOPY WITH OPEN ROTATOR CUFF REPAIR AND DISTAL CLAVICLE ACROMINECTOMY Left 08/11/2021   Procedure: SHOULDER ARTHROSCOPY WITH OPEN ROTATOR CUFF REPAIR AND DISTAL CLAVICLE ACROMINECTOMY;  Surgeon: Thornton Park, MD;  Location: [facility];  Service: Orthopedics;  Laterality: Left;    Medical History: Past Medical History:  Diagnosis Date   Arthritis    ankles   Basal cell carcinoma    BPH (benign prostatic hyperplasia)    Complication of anesthesia    slow to wake   Diabetes (HCC)    Hyperlipidemia    Vertigo    none for over 15 years  Family History: Family History  Problem Relation Age of Onset   Heart disease Father    Prostate cancer Neg Hx    Kidney cancer Neg Hx    Bladder Cancer Neg Hx     Social History   Socioeconomic History   Marital status: Married    Spouse name: [name]   Number of children: 4   Years of education: Not on file   Highest education level: Not on file  Occupational History   Not on file  Tobacco Use   Smoking status: Never   Smokeless tobacco: Never  Vaping Use   Vaping Use: Never used  Substance and Sexual Activity    Alcohol use: Yes    Comment: rare   Drug use: No   Sexual activity: Yes  Other Topics Concern   Not on file  Social History Narrative   Not on file   Social Determinants of Health   Financial Resource Strain: Not on file  Food Insecurity: Not on file  Transportation Needs: Not on file  Physical Activity: Not on file  Stress: Not on file  Social Connections: Not on file  Intimate Partner Violence: Not on file      Review of Systems  Constitutional:  Positive for fatigue. Negative for chills and unexpected weight change.  HENT: Negative.  Negative for congestion, postnasal drip, rhinorrhea, sneezing and sore throat.   Eyes:  Negative for redness.  Respiratory: Negative.  Negative for cough, chest tightness, shortness of breath and wheezing.   Cardiovascular: Negative.  Negative for chest pain and palpitations.  Gastrointestinal:  Negative for abdominal pain, constipation, diarrhea, nausea and vomiting.  Endocrine: Positive for polyuria.  Genitourinary:  Positive for decreased urine volume and difficulty urinating. Negative for dysuria and frequency.       Split urine stream Weak urine stream Urinary hesitancy nocturia  Musculoskeletal:  Positive for arthralgias and gait problem (has right foot drop). Negative for back pain, joint swelling and neck pain.  Skin:  Negative for rash.  Neurological:  Positive for tremors. Negative for numbness.  Hematological:  Negative for adenopathy. Does not bruise/bleed easily.  Psychiatric/Behavioral:  Negative for behavioral problems (Depression), sleep disturbance and suicidal ideas. The patient is not nervous/anxious.     Vital Signs: BP 122/74   Pulse 100   Temp (!) 97.1 F (36.2 C)   Resp 16   Ht 6\' 3"  (1.905 m)   Wt 194 lb (88 kg)   SpO2 95%   BMI 24.25 kg/m    Physical Exam Vitals reviewed.  Constitutional:      General: He is not in acute distress.    Appearance: Normal appearance. He is normal weight. He is not  ill-appearing.  HENT:     Head: Normocephalic and atraumatic.  Eyes:     Pupils: Pupils are equal, round, and reactive to light.  Cardiovascular:     Rate and Rhythm: Normal rate and regular rhythm.  Pulmonary:     Effort: Pulmonary effort is normal. No respiratory distress.  Neurological:     Mental Status: He is alert and oriented to person, place, and time.  Psychiatric:        Mood and Affect: Mood normal.        Behavior: Behavior normal.        Assessment/Plan: 1. Uncontrolled type 2 diabetes mellitus with hyperglycemia (HCC) Elevated A1c with no improvement. Has recently started back on ozempic and is also taking jardiance. Just started 0.5 dose, instructed patient  to call clinic in 4 weeks, if still tolerating medication, will increase ozempic dose to 1 mg weekly. Otherwise follow up in 3 months for repeat A1c  - POCT glycosylated hemoglobin (Hb A1C)  2. Neuropathy due to type 2 diabetes mellitus (HCC) Stopped gabapentin, start lyrica twice daily, if any issues call clinic for earlier appointment otherwise follow up in 3 months.  - pregabalin (LYRICA) 25 MG capsule; Take 1 capsule (25 mg total) by mouth 2 (two) times daily.  Dispense: 60 capsule; Refill: 2  3. Benign prostatic hyperplasia with nocturia Referred to urology to reestablish due to worsening symptoms of BPH - Ambulatory referral to Urology  4. Screening for colorectal cancer Due for routine colonoscopy, referred to GI - Ambulatory referral to Gastroenterology  5. Screening for prostate cancer Routine screening for prostate cancer with PSA level - PSA Total (Reflex To Free)   General Counseling: isaih genter understanding of the findings of todays visit and agrees with plan of treatment. I have discussed any further diagnostic evaluation that may be needed or ordered today. We also reviewed his medications today. he has been encouraged to call the office with any questions or concerns that should  arise related to todays visit.    Orders Placed This Encounter  Procedures   PSA Total (Reflex To Free)   Ambulatory referral to Gastroenterology   Ambulatory referral to Urology   POCT glycosylated hemoglobin (Hb A1C)    Meds ordered this encounter  Medications   pregabalin (LYRICA) 25 MG capsule    Sig: Take 1 capsule (25 mg total) by mouth 2 (two) times daily.    Dispense:  60 capsule    Refill:  2    Return in about 3 months (around 10/24/2022) for F/U, Recheck A1C, Gwendelyn Lanting PCP, pt insisted on 3 month F/U, did not want to come sooner.   Total time spent:30 Minutes Time spent includes review of chart, medications, test results, and follow up plan with the patient.   [place] Controlled Substance Database was reviewed by me.  This patient was seen by Jonetta Osgood, FNP-C in collaboration with Dr. Clayborn Bigness as a part of collaborative care agreement.   Wendy Hoback R. Valetta Fuller, MSN, FNP-C Internal medicine

## 2022-07-13 NOTE — Telephone Encounter (Signed)
Gastroenterology Pre-Procedure Review  Request Date: 07/27/22 Requesting Physician: Dr. Marius Ditch  PATIENT REVIEW QUESTIONS: The patient responded to the following health history questions as indicated:    1. Are you having any GI issues? no 2. Do you have a personal history of Polyps? no 3. Do you have a family history of Colon Cancer or Polyps? no 4. Diabetes Mellitus? yes (Takes Jardiance.  Has been advised to stop it 3 days prior to colonoscopy.  Stop Semaglutide 7 days prior to procedure.  Pt takes it on Mondays has been advised to stop it on 07/17/22) 5. Joint replacements in the past 12 months? Rotator cuff repair April 2023 6. Major health problems in the past 3 months?no 7. Any artificial heart valves, MVP, or defibrillator?no    MEDICATIONS & ALLERGIES:    Patient reports the following regarding taking any anticoagulation/antiplatelet therapy:   Plavix, Coumadin, Eliquis, Xarelto, Lovenox, Pradaxa, Brilinta, or Effient? no Aspirin? no  Patient confirms/reports the following medications:  Current Outpatient Medications  Medication Sig Dispense Refill   Ascorbic Acid (VITAMIN C PO) Take by mouth.     Cholecalciferol (VITAMIN D3 PO) Take by mouth.     Cyanocobalamin (VITAMIN B-12 PO) Take by mouth.     empagliflozin (JARDIANCE) 25 MG TABS tablet Take 1 tablet (25 mg total) by mouth daily. E11.65 90 tablet 1   ezetimibe (ZETIA) 10 MG tablet Take 1 tablet (10 mg total) by mouth daily. 90 tablet 1   FIBER SELECT GUMMIES PO Take 2 tablets by mouth daily.     gabapentin (NEURONTIN) 300 MG capsule Take 1 capsule (300 mg total) by mouth at bedtime. 90 capsule 3   Insulin Pen Needle (BD PEN NEEDLE NANO U/F) 32G X 4 MM MISC Use as directed with SOLIQUA to inject insulin every morning E11.65 300 each 1   Lancets 30G MISC Use to check blood sugars 5 times per day     Multiple Vitamin (MULTIVITAMIN WITH MINERALS) TABS tablet Take 1 tablet by mouth daily.     mupirocin ointment (BACTROBAN) 2 %  Apply 1 application. topically 2 (two) times daily. 30 g 1   pregabalin (LYRICA) 25 MG capsule Take 1 capsule (25 mg total) by mouth 2 (two) times daily. 60 capsule 2   rosuvastatin (CRESTOR) 40 MG tablet TAKE 1 TABLET BY MOUTH EVERY DAY 90 tablet 3   Semaglutide,0.25 or 0.'5MG'$ /DOS, 2 MG/3ML SOPN Inject 0.5 mg into the skin once a week. 9 mL 1   tamsulosin (FLOMAX) 0.4 MG CAPS capsule Take 2 capsules (0.8 mg total) by mouth daily. 180 capsule 1   triamcinolone cream (KENALOG) 0.1 % Apply 1 application. topically 2 (two) times daily. 45 g 1   No current facility-administered medications for this visit.    Patient confirms/reports the following allergies:  No Known Allergies  No orders of the defined types were placed in this encounter.   AUTHORIZATION INFORMATION Primary Insurance: 1D#: Group #:  Secondary Insurance: 1D#: Group #:  SCHEDULE INFORMATION: Date: 07/27/22 Time: Location: [place]

## 2022-07-15 LAB — PSA TOTAL (REFLEX TO FREE): Prostate Specific Ag, Serum: 2.5 ng/mL (ref 0.0–4.0)

## 2022-07-18 ENCOUNTER — Encounter: Payer: Self-pay | Admitting: Gastroenterology

## 2022-07-19 ENCOUNTER — Ambulatory Visit: Payer: Medicare Other | Admitting: Urology

## 2022-07-19 VITALS — BP 124/73 | HR 84 | Ht 75.0 in | Wt 193.0 lb

## 2022-07-19 DIAGNOSIS — N401 Enlarged prostate with lower urinary tract symptoms: Secondary | ICD-10-CM

## 2022-07-19 DIAGNOSIS — Z125 Encounter for screening for malignant neoplasm of prostate: Secondary | ICD-10-CM | POA: Diagnosis not present

## 2022-07-19 DIAGNOSIS — R339 Retention of urine, unspecified: Secondary | ICD-10-CM | POA: Diagnosis not present

## 2022-07-19 DIAGNOSIS — N400 Enlarged prostate without lower urinary tract symptoms: Secondary | ICD-10-CM

## 2022-07-19 LAB — URINALYSIS, COMPLETE
Bilirubin, UA: NEGATIVE
Leukocytes,UA: NEGATIVE
Nitrite, UA: NEGATIVE
Protein,UA: NEGATIVE
RBC, UA: NEGATIVE
Specific Gravity, UA: 1.015 (ref 1.005–1.030)
Urobilinogen, Ur: 0.2 mg/dL (ref 0.2–1.0)
pH, UA: 5 (ref 5.0–7.5)

## 2022-07-19 LAB — MICROSCOPIC EXAMINATION: Bacteria, UA: NONE SEEN

## 2022-07-19 LAB — BLADDER SCAN AMB NON-IMAGING: Scan Result: 466

## 2022-07-19 MED ORDER — SILODOSIN 8 MG PO CAPS: 8.0000 mg | ORAL_CAPSULE | Freq: Every day | ORAL | 1 refills | Status: DC

## 2022-07-19 NOTE — Progress Notes (Signed)
07/19/2022 9:13 PM   Michael Gross 12/16/1955 [id number]  Referring provider: Jonetta Osgood, NP [place],  [place] [address]  Chief Complaint  Patient presents with   New Patient (Initial Visit)    HPI: Michael Gross is a 67 y.o. male referred for evaluation of BPH.  2 year history of bothersome lower urinary tract symptoms.  Was placed on tamsulosin and subsequently titrated to 0.8 mg however no significant improvement in his voiding pattern. Most bothersome symptoms are urinary frequency, intermittent urinary stream and nocturia x 4.  He also complains of urgency and sensation of incomplete emptying. IPSS 22/35 with QoL rated 6/6 Denies dysuria, gross hematuria No flank, abdominal or pelvic pain   PMH: Past Medical History:  Diagnosis Date   Arthritis    ankles   Basal cell carcinoma    BPH (benign prostatic hyperplasia)    Complication of anesthesia    slow to wake   Diabetes ([place])    Hyperlipidemia    Vertigo    none for over 15 years    Surgical History: Past Surgical History:  Procedure Laterality Date   HERNIA REPAIR     2003   [place]     2003   SHOULDER ARTHROSCOPY WITH OPEN ROTATOR CUFF REPAIR AND DISTAL CLAVICLE ACROMINECTOMY Left 08/11/2021   Procedure: SHOULDER ARTHROSCOPY WITH OPEN ROTATOR CUFF REPAIR AND DISTAL CLAVICLE ACROMINECTOMY;  Surgeon: Thornton Park, MD;  Location: [facility] ORS;  Service: Orthopedics;  Laterality: Left;    Home Medications:  Allergies as of 07/19/2022   No Known Allergies      Medication List        Accurate as of July 19, 2022 11:59 PM. If you have any questions, ask your nurse or doctor.          BD Pen Needle Nano U/F 32G X 4 MM Misc Generic drug: Insulin Pen Needle Use as directed with SOLIQUA to inject insulin every morning E11.65   empagliflozin 25 MG Tabs tablet Commonly known as: JARDIANCE Take 1 tablet (25 mg total) by mouth daily. E11.65    ezetimibe 10 MG tablet Commonly known as: Zetia Take 1 tablet (10 mg total) by mouth daily.   gabapentin 300 MG capsule Commonly known as: NEURONTIN Take 1 capsule (300 mg total) by mouth at bedtime.   Lancets 30G Misc Use to check blood sugars 5 times per day   meloxicam 15 MG tablet Commonly known as: MOBIC Take 15 mg by mouth daily.   multivitamin with minerals Tabs tablet Take 1 tablet by mouth daily.   mupirocin ointment 2 % Commonly known as: BACTROBAN Apply 1 application. topically 2 (two) times daily.   pregabalin 25 MG capsule Commonly known as: LYRICA Take 1 capsule (25 mg total) by mouth 2 (two) times daily.   rosuvastatin 40 MG tablet Commonly known as: CRESTOR TAKE 1 TABLET BY MOUTH EVERY DAY   Semaglutide(0.25 or 0.'5MG'$ /DOS) 2 MG/3ML Sopn Inject 0.5 mg into the skin once a week.   silodosin 8 MG Caps capsule Commonly known as: RAPAFLO Take 1 capsule (8 mg total) by mouth daily.   tamsulosin 0.4 MG Caps capsule Commonly known as: FLOMAX Take 2 capsules (0.8 mg total) by mouth daily.   triamcinolone cream 0.1 % Commonly known as: KENALOG Apply 1 application. topically 2 (two) times daily.   VITAMIN B-12 PO Take by mouth.   VITAMIN C PO Take by mouth.  VITAMIN D3 PO Take by mouth.        Allergies: No Known Allergies  Family History: Family History  Problem Relation Age of Onset   Heart disease Father    Prostate cancer Neg Hx    Kidney cancer Neg Hx    Bladder Cancer Neg Hx     Social History:  reports that he has never smoked. He has never used smokeless tobacco. He reports current alcohol use. He reports that he does not use drugs.   Physical Exam: BP 124/73   Pulse 84   Ht '6\' 3"'$  (1.905 m)   Wt 193 lb (87.5 kg)   BMI 24.12 kg/m   Constitutional:  Alert and oriented, No acute distress. HEENT: [place] AT Respiratory: Normal respiratory effort, no increased work of breathing. GI: Abdomen is soft, nontender, nondistended, no  abdominal masses GU: Prostate 50 g, smooth without nodules Skin: No rashes, bruises or suspicious lesions. Psychiatric: Normal mood and affect.  Laboratory Data:  Urinalysis Microscopy negative   Assessment & Plan:    1. Benign prostatic hyperplasia with incomplete bladder emptying Severe lower urinary tract symptoms PVR today 466 mL He inquired if there is medication to shrink the prostate gland.  We discussed a 5-ARI medication however he was informed these medications take 4-6 months before seeing any efficacy Due to severe symptoms and incomplete emptying he may need to look at surgical options.  We discussed minimally invasive options including UroLift.  TURP and HoLEP were also discussed He would initially like to try different medication and a prostate-specific alpha-blocker was sent to pharmacy-silodosin 8 mg daily 1 month follow-up for repeat symptom check and PVR If no improvement schedule cystoscopy and TRUS for volume  2.  Prostate cancer screening PSA 07/14/2022 was 2.5   Abbie Sons, MD  [facility] [address], [place], [place] [address] [phone]

## 2022-07-20 ENCOUNTER — Encounter: Payer: Self-pay | Admitting: Urology

## 2022-07-27 ENCOUNTER — Encounter: Payer: Self-pay | Admitting: Gastroenterology

## 2022-07-27 ENCOUNTER — Other Ambulatory Visit: Payer: Self-pay

## 2022-07-27 ENCOUNTER — Ambulatory Visit (AMBULATORY_SURGERY_CENTER)
Admission: RE | Admit: 2022-07-27 | Discharge: 2022-07-27 | Disposition: A | Discharge status: Home / Self Care | Payer: Medicare Other | Source: Home / Self Care | Attending: Gastroenterology | Admitting: Gastroenterology

## 2022-07-27 ENCOUNTER — Ambulatory Visit: Payer: Medicare Other | Admitting: Anesthesiology

## 2022-07-27 ENCOUNTER — Encounter
Admission: RE | Disposition: A | Discharge status: Home / Self Care | Payer: Self-pay | Source: Home / Self Care | Attending: Gastroenterology

## 2022-07-27 DIAGNOSIS — K5730 Diverticulosis of large intestine without perforation or abscess without bleeding: Secondary | ICD-10-CM | POA: Insufficient documentation

## 2022-07-27 DIAGNOSIS — G709 Myoneural disorder, unspecified: Secondary | ICD-10-CM | POA: Insufficient documentation

## 2022-07-27 DIAGNOSIS — N400 Enlarged prostate without lower urinary tract symptoms: Secondary | ICD-10-CM | POA: Insufficient documentation

## 2022-07-27 DIAGNOSIS — Z7984 Long term (current) use of oral hypoglycemic drugs: Secondary | ICD-10-CM | POA: Insufficient documentation

## 2022-07-27 DIAGNOSIS — Z794 Long term (current) use of insulin: Secondary | ICD-10-CM | POA: Insufficient documentation

## 2022-07-27 DIAGNOSIS — K644 Residual hemorrhoidal skin tags: Secondary | ICD-10-CM | POA: Insufficient documentation

## 2022-07-27 DIAGNOSIS — Z85828 Personal history of other malignant neoplasm of skin: Secondary | ICD-10-CM | POA: Insufficient documentation

## 2022-07-27 DIAGNOSIS — E119 Type 2 diabetes mellitus without complications: Secondary | ICD-10-CM | POA: Insufficient documentation

## 2022-07-27 DIAGNOSIS — E1110 Type 2 diabetes mellitus with ketoacidosis without coma: Secondary | ICD-10-CM | POA: Diagnosis not present

## 2022-07-27 DIAGNOSIS — E785 Hyperlipidemia, unspecified: Secondary | ICD-10-CM | POA: Insufficient documentation

## 2022-07-27 DIAGNOSIS — I4510 Unspecified right bundle-branch block: Secondary | ICD-10-CM | POA: Insufficient documentation

## 2022-07-27 DIAGNOSIS — Z1211 Encounter for screening for malignant neoplasm of colon: Secondary | ICD-10-CM | POA: Insufficient documentation

## 2022-07-27 DIAGNOSIS — Z7985 Long-term (current) use of injectable non-insulin antidiabetic drugs: Secondary | ICD-10-CM | POA: Insufficient documentation

## 2022-07-27 HISTORY — PX: COLONOSCOPY WITH PROPOFOL: SHX5780

## 2022-07-27 HISTORY — DX: Dizziness and giddiness: R42

## 2022-07-27 HISTORY — DX: Other complications of anesthesia, initial encounter: T88.59XA

## 2022-07-27 HISTORY — DX: Unspecified osteoarthritis, unspecified site: M19.90

## 2022-07-27 LAB — GLUCOSE, CAPILLARY
Glucose-Capillary: 260 mg/dL — ABNORMAL HIGH (ref 70–99)
Glucose-Capillary: 204 mg/dL — ABNORMAL HIGH (ref 70–99)

## 2022-07-27 SURGERY — COLONOSCOPY WITH PROPOFOL
Anesthesia: General | Site: Rectum

## 2022-07-27 MED ORDER — SODIUM CHLORIDE 0.9 % IV SOLN: INTRAVENOUS | Status: DC

## 2022-07-27 MED ORDER — LIDOCAINE HCL (CARDIAC) PF 100 MG/5ML IV SOSY
PREFILLED_SYRINGE | INTRAVENOUS | Status: DC | PRN
  Administered 2022-07-27: 60 mg via INTRAVENOUS

## 2022-07-27 MED ORDER — ACETAMINOPHEN 325 MG PO TABS: 650.0000 mg | ORAL_TABLET | Freq: Once | ORAL | Status: DC | PRN

## 2022-07-27 MED ORDER — ACETAMINOPHEN 160 MG/5ML PO SOLN: 325.0000 mg | ORAL | Status: DC | PRN

## 2022-07-27 MED ORDER — ONDANSETRON HCL 4 MG/2ML IJ SOLN: 4.0000 mg | Freq: Once | INTRAMUSCULAR | Status: DC | PRN

## 2022-07-27 MED ORDER — PROPOFOL 10 MG/ML IV BOLUS
INTRAVENOUS | Status: DC | PRN
  Administered 2022-07-27 (×2): 20 mg via INTRAVENOUS
  Administered 2022-07-27: 30 mg via INTRAVENOUS
  Administered 2022-07-27 (×7): 20 mg via INTRAVENOUS

## 2022-07-27 MED ORDER — METOPROLOL TARTRATE 5 MG/5ML IV SOLN
INTRAVENOUS | Status: DC | PRN
  Administered 2022-07-27: 1 mg via INTRAVENOUS

## 2022-07-27 MED ORDER — LACTATED RINGERS IV SOLN: INTRAVENOUS | Status: DC

## 2022-07-27 MED ORDER — ONDANSETRON HCL 4 MG/2ML IJ SOLN
INTRAMUSCULAR | Status: DC | PRN
  Administered 2022-07-27: 4 mg via INTRAVENOUS

## 2022-07-27 MED ORDER — INSULIN REGULAR HUMAN 100 UNIT/ML IJ SOLN
5.0000 [IU] | Freq: Once | INTRAMUSCULAR | Status: AC
  Administered 2022-07-27: 5 [IU] via SUBCUTANEOUS

## 2022-07-27 MED ORDER — STERILE WATER FOR IRRIGATION IR SOLN
Status: DC | PRN
  Administered 2022-07-27: 1

## 2022-07-27 SURGICAL SUPPLY — 25 items

## 2022-07-27 NOTE — Transfer of Care (Signed)
Immediate Anesthesia Transfer of Care Note  Patient: Michael Gross  Procedure(s) Performed: COLONOSCOPY WITH PROPOFOL (Rectum)  Patient Location: PACU  Anesthesia Type: General  Level of Consciousness: awake, alert  and patient cooperative  Airway and Oxygen Therapy: Patient Spontanous Breathing and Patient connected to supplemental oxygen  Post-op Assessment: Post-op Vital signs reviewed, Patient's Cardiovascular Status Stable, Respiratory Function Stable, Patent Airway and No signs of Nausea or vomiting  Post-op Vital Signs: Reviewed and stable  Complications: No notable events documented.

## 2022-07-27 NOTE — Addendum Note (Signed)
Addendum  created 07/27/22 1111 by Darrin Nipper, MD   [place] recorded in [place], [place] filed

## 2022-07-27 NOTE — Op Note (Signed)
[facility] Gastroenterology Patient Name: Michael Gross Procedure Date: 07/27/2022 10:01 AM MRN: [id number] Account #: [id number] Date of Birth: 18-Jul-1955 Admit Type: Outpatient Age: 67 Room: [facility] OR ROOM 01 Gender: Male Note Status: Finalized Instrument Name: [id number] Procedure:             Colonoscopy Indications:           Screening for colorectal malignant neoplasm, Last                         colonoscopy 10 years ago Providers:             Lin Landsman MD, MD Referring MD:          Jonetta Osgood (Referring MD) Medicines:             General Anesthesia Complications:         No immediate complications. Estimated blood loss: None. Procedure:             Pre-Anesthesia Assessment:                        - Prior to the procedure, a History and Physical was                         performed, and patient medications and allergies were                         reviewed. The patient is competent. The risks and                         benefits of the procedure and the sedation options and                         risks were discussed with the patient. All questions                         were answered and informed consent was obtained.                         Patient identification and proposed procedure were                         verified by the physician, the nurse, the                         anesthesiologist, the anesthetist and the technician                         in the pre-procedure area in the procedure room in the                         endoscopy suite. Mental Status Examination: alert and                         oriented. Airway Examination: normal oropharyngeal                         airway and neck mobility. Respiratory Examination:  clear to auscultation. CV Examination: normal.                         Prophylactic Antibiotics: The patient does not require                         prophylactic antibiotics. Prior  Anticoagulants: The                         patient has taken no anticoagulant or antiplatelet                         agents. ASA Grade Assessment: III - A patient with                         severe systemic disease. After reviewing the risks and                         benefits, the patient was deemed in satisfactory                         condition to undergo the procedure. The anesthesia                         plan was to use general anesthesia. Immediately prior                         to administration of medications, the patient was                         re-assessed for adequacy to receive sedatives. The                         heart rate, respiratory rate, oxygen saturations,                         blood pressure, adequacy of pulmonary ventilation, and                         response to care were monitored throughout the                         procedure. The physical status of the patient was                         re-assessed after the procedure.                        After obtaining informed consent, the colonoscope was                         passed under direct vision. Throughout the procedure,                         the patient's blood pressure, pulse, and oxygen                         saturations were monitored continuously. The  Colonoscope was introduced through the anus and                         advanced to the the cecum, identified by appendiceal                         orifice and ileocecal valve. The colonoscopy was                         unusually difficult due to inadequate bowel prep and                         significant looping. Successful completion of the                         procedure was aided by applying abdominal pressure and                         lavage. The patient tolerated the procedure well. The                         quality of the bowel preparation was fair. The                         ileocecal valve,  appendiceal orifice, and rectum were                         photographed. Findings:      The perianal and digital rectal examinations were normal. Pertinent       negatives include normal sphincter tone and no palpable rectal lesions.      Multiple diverticula were found in the sigmoid colon.      Non-bleeding external hemorrhoids were found during retroflexion. The       hemorrhoids were medium-sized.      Copious quantities of liquid stool was found in the entire colon,       precluding visualization. Lavage of the area was performed using greater       than 500 mL of sterile water, resulting in clearance with fair       visualization. Impression:            - Preparation of the colon was fair.                        - Diverticulosis in the sigmoid colon.                        - Non-bleeding external hemorrhoids.                        - Stool in the entire examined colon.                        - No specimens collected. Recommendation:        - Discharge patient to home (with escort).                        - Diabetic (ADA) diet today.                        -  Continue present medications.                        - Repeat colonoscopy in 1 year with 2 day prep in the                         hospital because the bowel preparation was suboptimal. Procedure Code(s):     --- Professional ---                        [id number], Colorectal cancer screening; colonoscopy on                         individual not meeting criteria for high risk Diagnosis Code(s):     --- Professional ---                        K64.4, Residual hemorrhoidal skin tags                        K57.30, Diverticulosis of large intestine without                         perforation or abscess without bleeding                        Z12.11, Encounter for screening for malignant neoplasm                         of colon CPT copyright 2022 American Medical Association. All rights reserved. The codes documented in this report  are preliminary and upon coder review may  be revised to meet current compliance requirements. Dr. Ulyess Mort Lin Landsman MD, MD 07/27/2022 10:37:43 AM This report has been signed electronically. Number of Addenda: 0 Note Initiated On: 07/27/2022 10:01 AM Scope Withdrawal Time: 0 hours 16 minutes 53 seconds  Total Procedure Duration: 0 hours 22 minutes 58 seconds  Estimated Blood Loss:  Estimated blood loss: none.      [facility]

## 2022-07-27 NOTE — H&P (Signed)
Cephas Darby, MD [address]  [place]  [place], [place] [address]  Main: [phone]  Fax: [phone] Pager: [phone]  Primary Care Physician:  Jonetta Osgood, NP Primary Gastroenterologist:  Dr. Cephas Darby  Pre-Procedure History & Physical: HPI:  Michael Gross is a 67 y.o. male is here for an colonoscopy.   Past Medical History:  Diagnosis Date   Arthritis    ankles   Basal cell carcinoma    BPH (benign prostatic hyperplasia)    Complication of anesthesia    slow to wake   Diabetes ([place])    Hyperlipidemia    Vertigo    none for over 15 years    Past Surgical History:  Procedure Laterality Date   HERNIA REPAIR     2003   [place]   MOHS SURGERY     2003   SHOULDER ARTHROSCOPY WITH OPEN ROTATOR CUFF REPAIR AND DISTAL CLAVICLE ACROMINECTOMY Left 08/11/2021   Procedure: SHOULDER ARTHROSCOPY WITH OPEN ROTATOR CUFF REPAIR AND DISTAL CLAVICLE ACROMINECTOMY;  Surgeon: Thornton Park, MD;  Location: [facility] ORS;  Service: Orthopedics;  Laterality: Left;    Prior to Admission medications   Medication Sig Start Date End Date Taking? Authorizing Provider  Ascorbic Acid (VITAMIN C PO) Take by mouth.   Yes [provider]  Cholecalciferol (VITAMIN D3 PO) Take by mouth.   Yes [provider]  Cyanocobalamin (VITAMIN B-12 PO) Take by mouth.   Yes [provider]  empagliflozin (JARDIANCE) 25 MG TABS tablet Take 1 tablet (25 mg total) by mouth daily. E11.65 04/13/22  Yes Abernathy, Yetta Flock, NP  ezetimibe (ZETIA) 10 MG tablet Take 1 tablet (10 mg total) by mouth daily. 04/13/22  Yes Abernathy, Yetta Flock, NP  meloxicam (MOBIC) 15 MG tablet Take 15 mg by mouth daily.   Yes [provider]  Multiple Vitamin (MULTIVITAMIN WITH MINERALS) TABS tablet Take 1 tablet by mouth daily.   Yes [provider]  rosuvastatin (CRESTOR) 40 MG tablet TAKE 1 TABLET BY MOUTH EVERY DAY 12/02/21  Yes Abernathy, [name], NP   Semaglutide,0.25 or 0.5MG /DOS, 2 MG/3ML SOPN Inject 0.5 mg into the skin once a week. 07/05/22  Yes Abernathy, Yetta Flock, NP  silodosin (RAPAFLO) 8 MG CAPS capsule Take 1 capsule (8 mg total) by mouth daily. 07/19/22  Yes Stoioff, Ronda Fairly, MD  tamsulosin (FLOMAX) 0.4 MG CAPS capsule Take 2 capsules (0.8 mg total) by mouth daily. 04/13/22  Yes Abernathy, Yetta Flock, NP  gabapentin (NEURONTIN) 300 MG capsule Take 1 capsule (300 mg total) by mouth at bedtime. Patient not taking: Reported on 07/18/2022 04/13/22   Jonetta Osgood, NP  Insulin Pen Needle (BD PEN NEEDLE NANO U/F) 32G X 4 MM MISC Use as directed with SOLIQUA to inject insulin every morning E11.65 04/13/22   Jonetta Osgood, NP  Lancets 30G MISC Use to check blood sugars 5 times per day 03/27/13   [provider]  mupirocin ointment (BACTROBAN) 2 % Apply 1 application. topically 2 (two) times daily. 09/21/21   Jonetta Osgood, NP  pregabalin (LYRICA) 25 MG capsule Take 1 capsule (25 mg total) by mouth 2 (two) times daily. Patient not taking: Reported on 07/18/2022 07/13/22   Jonetta Osgood, NP  triamcinolone cream (KENALOG) 0.1 % Apply 1 application. topically 2 (two) times daily. 09/21/21   Jonetta Osgood, NP    Allergies as of 07/13/2022   (No Known Allergies)    Family History  Problem Relation Age of Onset  Heart disease Father    Prostate cancer Neg Hx    Kidney cancer Neg Hx    Bladder Cancer Neg Hx     Social History   Socioeconomic History   Marital status: Married    Spouse name: [name]   Number of children: 4   Years of education: Not on file   Highest education level: Not on file  Occupational History   Not on file  Tobacco Use   Smoking status: Never   Smokeless tobacco: Never  Vaping Use   Vaping Use: Never used  Substance and Sexual Activity   Alcohol use: Yes    Comment: rare   Drug use: No   Sexual activity: Yes  Other Topics Concern   Not on file  Social History Narrative   Not on file    Social Determinants of Health   Financial Resource Strain: Not on file  Food Insecurity: Not on file  Transportation Needs: Not on file  Physical Activity: Not on file  Stress: Not on file  Social Connections: Not on file  Intimate Partner Violence: Not on file    Review of Systems: See HPI, otherwise negative ROS  Physical Exam: BP (!) 142/76   Temp 97.6 F (36.4 C) (Tympanic)   Resp (!) 21   Ht 6\' 3"  (1.905 m)   Wt 82.1 kg   SpO2 96%   BMI 22.61 kg/m  General:   Alert,  pleasant and cooperative in NAD Head:  Normocephalic and atraumatic. Neck:  Supple; no masses or thyromegaly. Lungs:  Clear throughout to auscultation.    Heart:  Regular rate and rhythm. Abdomen:  Soft, nontender and nondistended. Normal bowel sounds, without guarding, and without rebound.   Neurologic:  Alert and  oriented x4;  grossly normal neurologically.  Impression/Plan: Michael Gross is here for an colonoscopy to be performed for colon cancer screening  Risks, benefits, limitations, and alternatives regarding  colonoscopy have been reviewed with the patient.  Questions have been answered.  All parties agreeable.   Sherri Sear, MD  07/27/2022, 8:41 AM

## 2022-07-27 NOTE — Anesthesia Preprocedure Evaluation (Addendum)
Anesthesia Evaluation  Patient identified by MRN, date of birth, ID band Patient awake    Reviewed: Allergy & Precautions, NPO status , Patient's Chart, lab work & pertinent test results  History of Anesthesia Complications Negative for: history of anesthetic complications  Airway Mallampati: IV   Neck ROM: Full    Dental no notable dental hx.    Pulmonary neg pulmonary ROS   Pulmonary exam normal breath sounds clear to auscultation       Cardiovascular Normal cardiovascular exam Rhythm:Regular Rate:Normal  ECG 08/09/21:  Sinus rhythm with Premature atrial complexes Left axis deviation Right bundle branch block   Neuro/Psych  Neuromuscular disease (neuropathy)    GI/Hepatic negative GI ROS,,,  Endo/Other  diabetes, Poorly Controlled, Type 2    Renal/GU negative Renal ROS   BPH    Musculoskeletal   Abdominal   Peds  Hematology negative hematology ROS (+)   Anesthesia Other Findings Patient arrived tachycardic, likely due to dehydration.  ECG largely unchanged from prior.  Gave 1L IV fluid and pt improved clinically.  Will proceed with case.  Reproductive/Obstetrics                             Anesthesia Physical Anesthesia Plan  ASA: 3  Anesthesia Plan: General   Post-op Pain Management:    Induction: Intravenous  PONV Risk Score and Plan: 2 and Propofol infusion, TIVA and Treatment may vary due to age or medical condition  Airway Management Planned: Natural Airway  Additional Equipment:   Intra-op Plan:   Post-operative Plan:   Informed Consent: I have reviewed the patients History and Physical, chart, labs and discussed the procedure including the risks, benefits and alternatives for the proposed anesthesia with the patient or authorized representative who has indicated his/her understanding and acceptance.       Plan Discussed with: CRNA  Anesthesia Plan Comments:  (LMA/GETA backup discussed.  Patient consented for risks of anesthesia including but not limited to:  - adverse reactions to medications - damage to eyes, teeth, lips or other oral mucosa - nerve damage due to positioning  - sore throat or hoarseness - damage to heart, brain, nerves, lungs, other parts of body or loss of life  Informed patient about role of CRNA in peri- and intra-operative care.  Patient voiced understanding.)        Anesthesia Quick Evaluation

## 2022-07-27 NOTE — Anesthesia Postprocedure Evaluation (Signed)
Anesthesia Post Note  Patient: MELODY KIRCHER  Procedure(s) Performed: COLONOSCOPY WITH PROPOFOL (Rectum)  Patient location during evaluation: PACU Anesthesia Type: General Level of consciousness: awake and alert, oriented and patient cooperative Pain management: pain level controlled Vital Signs Assessment: post-procedure vital signs reviewed and stable Respiratory status: spontaneous breathing, nonlabored ventilation and respiratory function stable Cardiovascular status: blood pressure returned to baseline and stable Postop Assessment: adequate PO intake Anesthetic complications: no   No notable events documented.   Last Vitals:  Vitals:   07/27/22 1045 07/27/22 1053  BP: 112/65 134/68  Pulse: 100 (!) 112  Resp: 17 17  Temp:  36.7 C  SpO2: 97% 99%    Last Pain:  Vitals:   07/27/22 1053  TempSrc:   PainSc: 0-No pain                 Darrin Nipper

## 2022-07-28 ENCOUNTER — Inpatient Hospital Stay: Payer: Medicare Other

## 2022-07-28 ENCOUNTER — Other Ambulatory Visit: Payer: Self-pay

## 2022-07-28 ENCOUNTER — Emergency Department: Payer: Medicare Other

## 2022-07-28 ENCOUNTER — Encounter: Payer: Self-pay | Admitting: Gastroenterology

## 2022-07-28 ENCOUNTER — Inpatient Hospital Stay
Admission: EM | Admit: 2022-07-28 | Discharge: 2022-08-02 | DRG: 638 | Disposition: A | Discharge status: Home / Self Care | Payer: Medicare Other | Attending: Internal Medicine | Admitting: Internal Medicine

## 2022-07-28 DIAGNOSIS — Z791 Long term (current) use of non-steroidal anti-inflammatories (NSAID): Secondary | ICD-10-CM | POA: Diagnosis not present

## 2022-07-28 DIAGNOSIS — E785 Hyperlipidemia, unspecified: Secondary | ICD-10-CM | POA: Diagnosis present

## 2022-07-28 DIAGNOSIS — E878 Other disorders of electrolyte and fluid balance, not elsewhere classified: Secondary | ICD-10-CM | POA: Diagnosis not present

## 2022-07-28 DIAGNOSIS — I4891 Unspecified atrial fibrillation: Secondary | ICD-10-CM | POA: Diagnosis not present

## 2022-07-28 DIAGNOSIS — K5730 Diverticulosis of large intestine without perforation or abscess without bleeding: Secondary | ICD-10-CM | POA: Diagnosis present

## 2022-07-28 DIAGNOSIS — E876 Hypokalemia: Secondary | ICD-10-CM | POA: Insufficient documentation

## 2022-07-28 DIAGNOSIS — E1169 Type 2 diabetes mellitus with other specified complication: Secondary | ICD-10-CM | POA: Diagnosis present

## 2022-07-28 DIAGNOSIS — I10 Essential (primary) hypertension: Secondary | ICD-10-CM | POA: Diagnosis not present

## 2022-07-28 DIAGNOSIS — E875 Hyperkalemia: Secondary | ICD-10-CM | POA: Diagnosis not present

## 2022-07-28 DIAGNOSIS — Z7901 Long term (current) use of anticoagulants: Secondary | ICD-10-CM

## 2022-07-28 DIAGNOSIS — I110 Hypertensive heart disease with heart failure: Secondary | ICD-10-CM | POA: Diagnosis present

## 2022-07-28 DIAGNOSIS — Z7985 Long-term (current) use of injectable non-insulin antidiabetic drugs: Secondary | ICD-10-CM

## 2022-07-28 DIAGNOSIS — K644 Residual hemorrhoidal skin tags: Secondary | ICD-10-CM | POA: Diagnosis present

## 2022-07-28 DIAGNOSIS — E1110 Type 2 diabetes mellitus with ketoacidosis without coma: Secondary | ICD-10-CM | POA: Diagnosis present

## 2022-07-28 DIAGNOSIS — Z8249 Family history of ischemic heart disease and other diseases of the circulatory system: Secondary | ICD-10-CM

## 2022-07-28 DIAGNOSIS — K219 Gastro-esophageal reflux disease without esophagitis: Secondary | ICD-10-CM | POA: Insufficient documentation

## 2022-07-28 DIAGNOSIS — Z7984 Long term (current) use of oral hypoglycemic drugs: Secondary | ICD-10-CM | POA: Diagnosis not present

## 2022-07-28 DIAGNOSIS — I5A Non-ischemic myocardial injury (non-traumatic): Secondary | ICD-10-CM | POA: Diagnosis present

## 2022-07-28 DIAGNOSIS — I480 Paroxysmal atrial fibrillation: Secondary | ICD-10-CM | POA: Diagnosis present

## 2022-07-28 DIAGNOSIS — I4510 Unspecified right bundle-branch block: Secondary | ICD-10-CM | POA: Diagnosis present

## 2022-07-28 DIAGNOSIS — D72829 Elevated white blood cell count, unspecified: Secondary | ICD-10-CM | POA: Diagnosis present

## 2022-07-28 DIAGNOSIS — E119 Type 2 diabetes mellitus without complications: Secondary | ICD-10-CM

## 2022-07-28 DIAGNOSIS — Z85828 Personal history of other malignant neoplasm of skin: Secondary | ICD-10-CM | POA: Diagnosis not present

## 2022-07-28 DIAGNOSIS — R000 Tachycardia, unspecified: Secondary | ICD-10-CM | POA: Diagnosis present

## 2022-07-28 DIAGNOSIS — N400 Enlarged prostate without lower urinary tract symptoms: Secondary | ICD-10-CM | POA: Diagnosis present

## 2022-07-28 DIAGNOSIS — Z79899 Other long term (current) drug therapy: Secondary | ICD-10-CM

## 2022-07-28 DIAGNOSIS — I152 Hypertension secondary to endocrine disorders: Secondary | ICD-10-CM | POA: Diagnosis present

## 2022-07-28 HISTORY — DX: Personal history of other medical treatment: Z92.89

## 2022-07-28 HISTORY — DX: Paroxysmal atrial fibrillation: I48.0

## 2022-07-28 LAB — CBC WITH DIFFERENTIAL/PLATELET
Abs Immature Granulocytes: 0.13 10*3/uL — ABNORMAL HIGH (ref 0.00–0.07)
Basophils Absolute: 0 10*3/uL (ref 0.0–0.1)
Basophils Relative: 0 %
Eosinophils Absolute: 0 10*3/uL (ref 0.0–0.5)
Eosinophils Relative: 0 %
HCT: 50.4 % (ref 39.0–52.0)
Hemoglobin: 16.2 g/dL (ref 13.0–17.0)
Immature Granulocytes: 1 %
Lymphocytes Relative: 6 %
Lymphs Abs: 0.9 10*3/uL (ref 0.7–4.0)
MCH: 30.1 pg (ref 26.0–34.0)
MCHC: 32.1 g/dL (ref 30.0–36.0)
MCV: 93.5 fL (ref 80.0–100.0)
Monocytes Absolute: 1.2 10*3/uL — ABNORMAL HIGH (ref 0.1–1.0)
Monocytes Relative: 8 %
Neutro Abs: 12 10*3/uL — ABNORMAL HIGH (ref 1.7–7.7)
Neutrophils Relative %: 85 %
Platelets: 163 10*3/uL (ref 150–400)
RBC: 5.39 MIL/uL (ref 4.22–5.81)
RDW: 13.5 % (ref 11.5–15.5)
WBC: 14.2 10*3/uL — ABNORMAL HIGH (ref 4.0–10.5)
nRBC: 0 % (ref 0.0–0.2)

## 2022-07-28 LAB — BETA-HYDROXYBUTYRIC ACID: Beta-Hydroxybutyric Acid: 7.45 mmol/L — ABNORMAL HIGH (ref 0.05–0.27)

## 2022-07-28 LAB — URINALYSIS, ROUTINE W REFLEX MICROSCOPIC
Bilirubin Urine: NEGATIVE
Glucose, UA: >=500 mg/dL — AB
Ketones, ur: 80 mg/dL — AB
Leukocytes,Ua: NEGATIVE
Nitrite: NEGATIVE
Protein, ur: 30 mg/dL — AB
Specific Gravity, Urine: 1.017 (ref 1.005–1.030)
pH: 5 (ref 5.0–8.0)

## 2022-07-28 LAB — GLUCOSE, CAPILLARY
Glucose-Capillary: 181 mg/dL — ABNORMAL HIGH (ref 70–99)
Glucose-Capillary: 187 mg/dL — ABNORMAL HIGH (ref 70–99)
Glucose-Capillary: 190 mg/dL — ABNORMAL HIGH (ref 70–99)
Glucose-Capillary: 213 mg/dL — ABNORMAL HIGH (ref 70–99)
Glucose-Capillary: 233 mg/dL — ABNORMAL HIGH (ref 70–99)
Glucose-Capillary: 252 mg/dL — ABNORMAL HIGH (ref 70–99)

## 2022-07-28 LAB — BASIC METABOLIC PANEL
Anion gap: 20 — ABNORMAL HIGH (ref 5–15)
Anion gap: 18 — ABNORMAL HIGH (ref 5–15)
BUN: 21 mg/dL (ref 8–23)
BUN: 20 mg/dL (ref 8–23)
BUN: 20 mg/dL (ref 8–23)
CO2: <7 mmol/L — ABNORMAL LOW (ref 22–32)
CO2: 8 mmol/L — ABNORMAL LOW (ref 22–32)
CO2: 9 mmol/L — ABNORMAL LOW (ref 22–32)
Calcium: 8.9 mg/dL (ref 8.9–10.3)
Calcium: 8.8 mg/dL — ABNORMAL LOW (ref 8.9–10.3)
Calcium: 8.6 mg/dL — ABNORMAL LOW (ref 8.9–10.3)
Chloride: 111 mmol/L (ref 98–111)
Chloride: 115 mmol/L — ABNORMAL HIGH (ref 98–111)
Chloride: 115 mmol/L — ABNORMAL HIGH (ref 98–111)
Creatinine, Ser: 0.97 mg/dL (ref 0.61–1.24)
Creatinine, Ser: 0.95 mg/dL (ref 0.61–1.24)
Creatinine, Ser: 1 mg/dL (ref 0.61–1.24)
GFR, Estimated: >60 mL/min (ref >60)
GFR, Estimated: >60 mL/min (ref >60)
GFR, Estimated: >60 mL/min (ref >60)
Glucose, Bld: 267 mg/dL — ABNORMAL HIGH (ref 70–99)
Glucose, Bld: 216 mg/dL — ABNORMAL HIGH (ref 70–99)
Glucose, Bld: 203 mg/dL — ABNORMAL HIGH (ref 70–99)
Potassium: 4.6 mmol/L (ref 3.5–5.1)
Potassium: 3.4 mmol/L — ABNORMAL LOW (ref 3.5–5.1)
Potassium: 3.1 mmol/L — ABNORMAL LOW (ref 3.5–5.1)
Sodium: 137 mmol/L (ref 135–145)
Sodium: 143 mmol/L (ref 135–145)
Sodium: 142 mmol/L (ref 135–145)

## 2022-07-28 LAB — CBG MONITORING, ED
Glucose-Capillary: 273 mg/dL — ABNORMAL HIGH (ref 70–99)
Glucose-Capillary: 235 mg/dL — ABNORMAL HIGH (ref 70–99)
Glucose-Capillary: 244 mg/dL — ABNORMAL HIGH (ref 70–99)

## 2022-07-28 LAB — COMPREHENSIVE METABOLIC PANEL
ALT: 27 U/L (ref 0–44)
AST: 17 U/L (ref 15–41)
Albumin: 3.7 g/dL (ref 3.5–5.0)
Alkaline Phosphatase: 80 U/L (ref 38–126)
BUN: 23 mg/dL (ref 8–23)
CO2: <7 mmol/L — ABNORMAL LOW (ref 22–32)
Calcium: 9.3 mg/dL (ref 8.9–10.3)
Chloride: 109 mmol/L (ref 98–111)
Creatinine, Ser: 1.19 mg/dL (ref 0.61–1.24)
GFR, Estimated: >60 mL/min (ref >60)
Glucose, Bld: 377 mg/dL — ABNORMAL HIGH (ref 70–99)
Potassium: 4 mmol/L (ref 3.5–5.1)
Sodium: 136 mmol/L (ref 135–145)
Total Bilirubin: 1.9 mg/dL — ABNORMAL HIGH (ref 0.3–1.2)
Total Protein: 6.8 g/dL (ref 6.5–8.1)

## 2022-07-28 LAB — BLOOD GAS, VENOUS
Acid-base deficit: 23.9 mmol/L — ABNORMAL HIGH (ref 0.0–2.0)
Bicarbonate: 5.3 mmol/L — ABNORMAL LOW (ref 20.0–28.0)
O2 Saturation: 78.2 %
Patient temperature: 37
pCO2, Ven: 20 mmHg — ABNORMAL LOW (ref 44–60)
pH, Ven: 7.03 — CL (ref 7.25–7.43)
pO2, Ven: 48 mmHg — ABNORMAL HIGH (ref 32–45)

## 2022-07-28 LAB — TROPONIN I (HIGH SENSITIVITY)
Troponin I (High Sensitivity): 26 ng/L — ABNORMAL HIGH (ref <18)
Troponin I (High Sensitivity): 24 ng/L — ABNORMAL HIGH (ref <18)
Troponin I (High Sensitivity): 38 ng/L — ABNORMAL HIGH (ref <18)
Troponin I (High Sensitivity): 35 ng/L — ABNORMAL HIGH (ref <18)
Troponin I (High Sensitivity): 47 ng/L — ABNORMAL HIGH (ref <18)

## 2022-07-28 LAB — MRSA NEXT GEN BY PCR, NASAL: MRSA by PCR Next Gen: NOT DETECTED

## 2022-07-28 LAB — MAGNESIUM: Magnesium: 2.6 mg/dL — ABNORMAL HIGH (ref 1.7–2.4)

## 2022-07-28 LAB — TSH: TSH: 0.32 u[IU]/mL — ABNORMAL LOW (ref 0.350–4.500)

## 2022-07-28 MED ORDER — POTASSIUM CHLORIDE 10 MEQ/100ML IV SOLN
10.0000 meq | INTRAVENOUS | Status: AC
  Administered 2022-07-28 (×2): 10 meq via INTRAVENOUS
  Filled 2022-07-28 (×2): qty 100

## 2022-07-28 MED ORDER — LACTATED RINGERS IV BOLUS
1000.0000 mL | Freq: Once | INTRAVENOUS | Status: AC
  Administered 2022-07-28: 1000 mL via INTRAVENOUS

## 2022-07-28 MED ORDER — LACTATED RINGERS IV BOLUS
500.0000 mL | Freq: Once | INTRAVENOUS | Status: AC
  Administered 2022-07-28: 500 mL via INTRAVENOUS

## 2022-07-28 MED ORDER — STERILE WATER FOR INJECTION IV SOLN
INTRAVENOUS | Status: DC
  Filled 2022-07-28: qty 150
  Filled 2022-07-28: qty 1000

## 2022-07-28 MED ORDER — AMIODARONE HCL IN DEXTROSE 360-4.14 MG/200ML-% IV SOLN
60.0000 mg/h | INTRAVENOUS | Status: DC
  Administered 2022-07-28 (×2): 60 mg/h via INTRAVENOUS
  Filled 2022-07-28 (×2): qty 200

## 2022-07-28 MED ORDER — ONDANSETRON HCL 4 MG/2ML IJ SOLN
4.0000 mg | Freq: Three times a day (TID) | INTRAMUSCULAR | Status: DC | PRN
  Administered 2022-08-01 – 2022-08-02 (×3): 4 mg via INTRAVENOUS
  Filled 2022-07-28 (×3): qty 2

## 2022-07-28 MED ORDER — POTASSIUM CHLORIDE 10 MEQ/100ML IV SOLN
10.0000 meq | INTRAVENOUS | Status: DC
  Filled 2022-07-28 (×3): qty 100

## 2022-07-28 MED ORDER — LACTATED RINGERS IV SOLN: INTRAVENOUS | Status: DC

## 2022-07-28 MED ORDER — AMIODARONE LOAD VIA INFUSION
150.0000 mg | Freq: Once | INTRAVENOUS | Status: AC
  Administered 2022-07-28: 150 mg via INTRAVENOUS
  Filled 2022-07-28: qty 83.34

## 2022-07-28 MED ORDER — ROSUVASTATIN CALCIUM 10 MG PO TABS
40.0000 mg | ORAL_TABLET | Freq: Every day | ORAL | Status: DC
  Administered 2022-07-29 – 2022-08-02 (×5): 40 mg via ORAL
  Filled 2022-07-28: qty 4
  Filled 2022-07-28: qty 2
  Filled 2022-07-28 (×4): qty 4

## 2022-07-28 MED ORDER — HYDRALAZINE HCL 20 MG/ML IJ SOLN: 5.0000 mg | INTRAMUSCULAR | Status: DC | PRN

## 2022-07-28 MED ORDER — DEXTROSE 50 % IV SOLN: 0.0000 mL | INTRAVENOUS | Status: DC | PRN

## 2022-07-28 MED ORDER — METOPROLOL TARTRATE 5 MG/5ML IV SOLN: 5.0000 mg | Freq: Once | INTRAVENOUS | Status: AC

## 2022-07-28 MED ORDER — SODIUM CHLORIDE 0.9 % IV SOLN: INTRAVENOUS | Status: DC | PRN

## 2022-07-28 MED ORDER — INSULIN REGULAR(HUMAN) IN NACL 100-0.9 UT/100ML-% IV SOLN: INTRAVENOUS | Status: DC

## 2022-07-28 MED ORDER — SODIUM BICARBONATE 8.4 % IV SOLN
50.0000 meq | Freq: Once | INTRAVENOUS | Status: AC
  Administered 2022-07-28: 50 meq via INTRAVENOUS
  Filled 2022-07-28: qty 50

## 2022-07-28 MED ORDER — DEXTROSE IN LACTATED RINGERS 5 % IV SOLN: INTRAVENOUS | Status: DC

## 2022-07-28 MED ORDER — ENOXAPARIN SODIUM 40 MG/0.4ML IJ SOSY
40.0000 mg | PREFILLED_SYRINGE | INTRAMUSCULAR | Status: DC
  Administered 2022-07-28 – 2022-08-01 (×5): 40 mg via SUBCUTANEOUS
  Filled 2022-07-28 (×5): qty 0.4

## 2022-07-28 MED ORDER — EZETIMIBE 10 MG PO TABS
10.0000 mg | ORAL_TABLET | Freq: Every day | ORAL | Status: DC
  Administered 2022-07-29 – 2022-08-02 (×5): 10 mg via ORAL
  Filled 2022-07-28 (×6): qty 1

## 2022-07-28 MED ORDER — METOPROLOL TARTRATE 5 MG/5ML IV SOLN
INTRAVENOUS | Status: AC
  Administered 2022-07-28: 5 mg via INTRAVENOUS
  Filled 2022-07-28: qty 5

## 2022-07-28 MED ORDER — ACETAMINOPHEN 325 MG PO TABS
650.0000 mg | ORAL_TABLET | Freq: Four times a day (QID) | ORAL | Status: DC | PRN
  Administered 2022-08-01: 650 mg via ORAL
  Filled 2022-07-28: qty 2

## 2022-07-28 MED ORDER — AMIODARONE HCL IN DEXTROSE 360-4.14 MG/200ML-% IV SOLN
30.0000 mg/h | INTRAVENOUS | Status: DC
  Administered 2022-07-29 (×2): 30 mg/h via INTRAVENOUS
  Administered 2022-07-30 (×2): 60 mg/h via INTRAVENOUS
  Administered 2022-07-30 – 2022-07-31 (×2): 30 mg/h via INTRAVENOUS
  Administered 2022-07-31 (×2): 60 mg/h via INTRAVENOUS
  Administered 2022-08-01: 30 mg/h via INTRAVENOUS
  Filled 2022-07-28 (×9): qty 200

## 2022-07-28 MED ORDER — ASPIRIN 81 MG PO TBEC
81.0000 mg | DELAYED_RELEASE_TABLET | Freq: Every day | ORAL | Status: DC
  Administered 2022-07-28 – 2022-08-02 (×6): 81 mg via ORAL
  Filled 2022-07-28 (×6): qty 1

## 2022-07-28 MED ORDER — PREGABALIN 25 MG PO CAPS
25.0000 mg | ORAL_CAPSULE | Freq: Two times a day (BID) | ORAL | Status: DC
  Administered 2022-07-28 – 2022-08-02 (×10): 25 mg via ORAL
  Filled 2022-07-28 (×10): qty 1

## 2022-07-28 MED ORDER — INSULIN REGULAR(HUMAN) IN NACL 100-0.9 UT/100ML-% IV SOLN
INTRAVENOUS | Status: AC
  Administered 2022-07-28: 3.2 [IU]/h via INTRAVENOUS
  Filled 2022-07-28 (×2): qty 100

## 2022-07-28 MED ORDER — TAMSULOSIN HCL 0.4 MG PO CAPS
0.8000 mg | ORAL_CAPSULE | Freq: Every day | ORAL | Status: DC
  Administered 2022-07-29 – 2022-08-02 (×5): 0.8 mg via ORAL
  Filled 2022-07-28 (×6): qty 2

## 2022-07-28 MED ORDER — CHLORHEXIDINE GLUCONATE CLOTH 2 % EX PADS
6.0000 | MEDICATED_PAD | Freq: Every day | CUTANEOUS | Status: DC
  Administered 2022-07-29 – 2022-08-02 (×5): 6 via TOPICAL

## 2022-07-28 MED ORDER — VITAMIN B-12 100 MCG PO TABS
100.0000 ug | ORAL_TABLET | Freq: Every day | ORAL | Status: DC
  Administered 2022-07-29 – 2022-08-02 (×5): 100 ug via ORAL
  Filled 2022-07-28 (×5): qty 1

## 2022-07-28 NOTE — ED Triage Notes (Signed)
[facility] reports pt coming from home for elevated blood sugar in the 500s. Pt had not taken all his meds for the  past 5-6 days.  Recent colonoscopy.

## 2022-07-28 NOTE — H&P (Addendum)
History and Physical    Michael Gross [id number] DOB: [birth year] DOA: 07/28/2022  Referring MD/NP/PA:   PCP: Jonetta Osgood, NP   Patient coming from:  The patient is coming from home.  At baseline, pt is independent for most of ADL.        Chief Complaint: weakness  HPI: CAPTAIN TASHJIAN is a 67 y.o. male with medical history significant of diabetes mellitus, hypertension, hyperlipidemia, BPH, vertigo, who presents with weakness.    Pt states that he was told to hold all of his diabetic medications prior to his colonoscopy that was performed yesterday.  Colonoscopy showed diverticulosis, no active bleeding.  He developed weakness, which has been progressively worsening.  His blood sugar is elevated up to 569 yesterday. Patient states that he restarted his diabetic medications earlier today after completing colonoscopy yesterday.  Patient has palpitation, denies chest pain, cough, shortness breath.  No nausea, vomiting, diarrhea or abdominal pain.  No symptoms of UTI.  Patient was found to have severe tachycardia in ED with heart rate up to 170s.  Telemetry seems to show A-fib, but not very sure.  The stat EKG showed wide-complex tachycardia, difficult to determine rhythm.  Patient was given 5 mg of metoprolol, heart rate improved to 140-150s.  Repeated EKG showed sinus tachycardia.  Data reviewed independently and ED Course: pt was found to have DKA (blood sugar 377, bicarbonate<7, positive ketone 80 in urinalysis), WBC 14.2, GFR> 60, temperature normal, blood pressure 120/61, RR 23, oxygen saturation 97% on room air.  Patient is admitted to stepdown as inpatient. Consulted Dr. Curt Bears.  EKG: I have personally reviewed. See above   Review of Systems:   General: no fevers, chills, no body weight gain, fatigue HEENT: no blurry vision, hearing changes or sore throat Respiratory: no dyspnea, coughing, wheezing CV: no chest pain, has palpitations GI: no nausea, vomiting,  abdominal pain, diarrhea, constipation GU: no dysuria, burning on urination, increased urinary frequency, hematuria  Ext: no leg edema Neuro: no unilateral weakness, numbness, or tingling, no vision change or hearing loss Skin: no rash, no skin tear. MSK: No muscle spasm, no deformity, no limitation of range of movement in spin Heme: No easy bruising.  Travel history: No recent long distant travel.   Allergy: No Known Allergies  Past Medical History:  Diagnosis Date   Arthritis    ankles   Basal cell carcinoma    BPH (benign prostatic hyperplasia)    Complication of anesthesia    slow to wake   Diabetes (HCC)    Hyperlipidemia    Vertigo    none for over 15 years    Past Surgical History:  Procedure Laterality Date   COLONOSCOPY WITH PROPOFOL N/A 07/27/2022   Procedure: COLONOSCOPY WITH PROPOFOL;  Surgeon: Lin Landsman, MD;  Location: [facility];  Service: Endoscopy;  Laterality: N/A;  Diabetic   HERNIA REPAIR     2003   KNEE SURGERY     1998   MOHS SURGERY     2003   SHOULDER ARTHROSCOPY WITH OPEN ROTATOR CUFF REPAIR AND DISTAL CLAVICLE ACROMINECTOMY Left 08/11/2021   Procedure: SHOULDER ARTHROSCOPY WITH OPEN ROTATOR CUFF REPAIR AND DISTAL CLAVICLE ACROMINECTOMY;  Surgeon: Thornton Park, MD;  Location: [facility];  Service: Orthopedics;  Laterality: Left;    Social History:  reports that he has never smoked. He has never used smokeless tobacco. He reports current alcohol use. He reports that he does not use drugs.  Family History:  Family History  Problem Relation Age of Onset   Heart disease Father    Prostate cancer Neg Hx    Kidney cancer Neg Hx    Bladder Cancer Neg Hx      Prior to Admission medications   Medication Sig Start Date End Date Taking? Authorizing Provider  Ascorbic Acid (VITAMIN C PO) Take by mouth.   Yes [provider]  Cholecalciferol (VITAMIN D3 PO) Take by mouth.   Yes [provider]  Cyanocobalamin  (VITAMIN B-12 PO) Take by mouth.   Yes [provider]  empagliflozin (JARDIANCE) 25 MG TABS tablet Take 1 tablet (25 mg total) by mouth daily. E11.65 04/13/22  Yes Abernathy, Yetta Flock, NP  ezetimibe (ZETIA) 10 MG tablet Take 1 tablet (10 mg total) by mouth daily. 04/13/22  Yes Abernathy, [name], NP  glimepiride (AMARYL) 4 MG tablet Take 4 mg by mouth 2 (two) times daily. 05/01/22  Yes [provider]  meloxicam (MOBIC) 15 MG tablet Take 15 mg by mouth daily.   Yes [provider]  Multiple Vitamin (MULTIVITAMIN WITH MINERALS) TABS tablet Take 1 tablet by mouth daily.   Yes [provider]  mupirocin ointment (BACTROBAN) 2 % Apply 1 application. topically 2 (two) times daily. 09/21/21  Yes Abernathy, Yetta Flock, NP  pregabalin (LYRICA) 25 MG capsule Take 1 capsule (25 mg total) by mouth 2 (two) times daily. 07/13/22  Yes Abernathy, [name], NP  rosuvastatin (CRESTOR) 40 MG tablet TAKE 1 TABLET BY MOUTH EVERY DAY 12/02/21  Yes Abernathy, [name], NP  Semaglutide,0.25 or 0.5MG /DOS, 2 MG/3ML SOPN Inject 0.5 mg into the skin once a week. 07/05/22  Yes Abernathy, Yetta Flock, NP  silodosin (RAPAFLO) 8 MG CAPS capsule Take 1 capsule (8 mg total) by mouth daily. 07/19/22  Yes Stoioff, Ronda Fairly, MD  tamsulosin (FLOMAX) 0.4 MG CAPS capsule Take 2 capsules (0.8 mg total) by mouth daily. 04/13/22  Yes Abernathy, Yetta Flock, NP  triamcinolone cream (KENALOG) 0.1 % Apply 1 application. topically 2 (two) times daily. 09/21/21  Yes Abernathy, Yetta Flock, NP  Insulin Pen Needle (BD PEN NEEDLE NANO U/F) 32G X 4 MM MISC Use as directed with SOLIQUA to inject insulin every morning E11.65 04/13/22   Jonetta Osgood, NP  Lancets 30G MISC Use to check blood sugars 5 times per day 03/27/13   [provider]    Physical Exam: Vitals:   07/28/22 1530 07/28/22 1600 07/28/22 1615 07/28/22 1730  BP: 113/75 115/68  117/81  Pulse: (!) 133 (!) 136 (!) 136 98  Resp:   20 (!) 22  Temp:      TempSrc:      SpO2:  95% 96% 97% 95%   General: Not in acute distress HEENT:       Eyes: PERRL, EOMI, no scleral icterus.       ENT: No discharge from the ears and nose, no pharynx injection, no tonsillar enlargement.        Neck: No JVD, no bruit, no mass felt. Heme: No neck lymph node enlargement. Cardiac: S1/S2, No murmurs, No gallops or rubs. Respiratory: No rales, wheezing, rhonchi or rubs. GI: Soft, nondistended, nontender, no rebound pain, no organomegaly, BS present. GU: No hematuria Ext: No pitting leg edema bilaterally. 1+DP/PT pulse bilaterally. Musculoskeletal: No joint deformities, No joint redness or warmth, no limitation of ROM in spin. Skin: No rashes.  Neuro: Patient is lethargic, easily arousable, oriented X3, cranial nerves II-XII grossly intact, moves all extremities normally. Psych: Patient is not psychotic, no suicidal or hemocidal ideation.  Labs on Admission: I have personally reviewed following labs and imaging studies  CBC: Recent Labs  Lab 07/28/22 1409  WBC 14.2*  NEUTROABS 12.0*  HGB 16.2  HCT 50.4  MCV 93.5  PLT [id number]   Basic Metabolic Panel: Recent Labs  Lab 07/28/22 1409 07/28/22 1633  NA 136 137  K 4.0 4.6  CL 109 111  CO2 <7* <7*  GLUCOSE 377* 267*  BUN 23 21  CREATININE 1.19 0.97  CALCIUM 9.3 8.9  MG 2.6*  --    GFR: Estimated Creatinine Clearance: 87 mL/min (by C-G formula based on SCr of 0.97 mg/dL). Liver Function Tests: Recent Labs  Lab 07/28/22 1409  AST 17  ALT 27  ALKPHOS 80  BILITOT 1.9*  PROT 6.8  ALBUMIN 3.7   No results for input(s): "LIPASE", "AMYLASE" in the last 168 hours. No results for input(s): "AMMONIA" in the last 168 hours. Coagulation Profile: No results for input(s): "INR", "PROTIME" in the last 168 hours. Cardiac Enzymes: No results for input(s): "CKTOTAL", "CKMB", "CKMBINDEX", "TROPONINI" in the last 168 hours. BNP (last 3 results) No results for input(s): "PROBNP" in the last 8760 hours. HbA1C: No results for  input(s): "HGBA1C" in the last 72 hours. CBG: Recent Labs  Lab 07/27/22 0824 07/27/22 1044 07/28/22 1555 07/28/22 1648 07/28/22 1716  GLUCAP 260* 204* 273* 235* 244*   Lipid Profile: No results for input(s): "CHOL", "HDL", "LDLCALC", "TRIG", "CHOLHDL", "LDLDIRECT" in the last 72 hours. Thyroid Function Tests: No results for input(s): "TSH", "T4TOTAL", "FREET4", "T3FREE", "THYROIDAB" in the last 72 hours. Anemia Panel: No results for input(s): "VITAMINB12", "FOLATE", "FERRITIN", "TIBC", "IRON", "RETICCTPCT" in the last 72 hours. Urine analysis:    Component Value Date/Time   COLORURINE STRAW (A) 07/28/2022 1409   APPEARANCEUR CLEAR (A) 07/28/2022 1409   APPEARANCEUR Clear 07/19/2022 1127   LABSPEC 1.017 07/28/2022 1409   PHURINE 5.0 07/28/2022 1409   GLUCOSEU >=500 (A) 07/28/2022 1409   GLUCOSEU NEGATIVE 01/13/2013 1557   HGBUR LARGE (A) 07/28/2022 1409   BILIRUBINUR NEGATIVE 07/28/2022 1409   BILIRUBINUR Negative 07/19/2022 1127   KETONESUR 80 (A) 07/28/2022 1409   PROTEINUR 30 (A) 07/28/2022 1409   UROBILINOGEN 1.0 01/13/2013 1557   NITRITE NEGATIVE 07/28/2022 1409   LEUKOCYTESUR NEGATIVE 07/28/2022 1409   Sepsis Labs: @LABRCNTIP (procalcitonin:4,lacticidven:4) ) Recent Results (from the past 240 hour(s))  Microscopic Examination     Status: None   Collection Time: 07/19/22 11:27 AM   Urine  Result Value Ref Range Status   WBC, UA 0-5 0 - 5 /hpf Final   RBC, Urine 0-2 0 - 2 /hpf Final   Epithelial Cells (non renal) 0-10 0 - 10 /hpf Final   Bacteria, UA None seen None seen/Few Final     Radiological Exams on Admission: DG Chest Port 1 View  Result Date: 07/28/2022 CLINICAL DATA:  Leukocytosis. EXAM: PORTABLE CHEST 1 VIEW COMPARISON:  Chest radiograph dated 07/28/2022. FINDINGS: No focal consolidation, pleural effusion or pneumothorax. The cardiac silhouette is within limits. Atherosclerotic calcification of the aorta. No acute osseous pathology. Degenerative  changes of the spine. IMPRESSION: No active disease. Electronically Signed   By: Anner Crete M.D.   On: 07/28/2022 17:36   DG Chest Portable 1 View  Result Date: 07/28/2022 CLINICAL DATA:  Chest pain EXAM: PORTABLE CHEST 1 VIEW COMPARISON:  X-ray 05/19/2019 FINDINGS: Calcified aorta. No consolidation, pneumothorax or effusion. No edema. Normal cardiopericardial silhouette. Overlapping cardiac leads. Degenerative changes are seen along the spine. IMPRESSION: No acute cardiopulmonary disease  Electronically Signed   By: Jill Side M.D.   On: 07/28/2022 14:35      Assessment/Plan Principal Problem:   DKA (diabetic ketoacidosis) ([place]) Active Problems:   Diabetes mellitus without complication ([place])   Myocardial injury   Tachycardia   Essential hypertension   HLD (hyperlipidemia)   Leukocytosis   Benign prostatic hyperplasia without lower urinary tract symptoms   Assessment and Plan:  DKA (diabetic ketoacidosis) ([place]): blood sugar 377, bicarbonate<7, positive ketone 80 in urinalysis.  Patient is still lethargic, but easily arousable, oriented x 3.  - Admit to stepdown as inpt - Place in SDU for obs - IVF:  2.5L of LR bolus - start DKA protocol with BMP q4h - IVF: LR at 125 cc/h, will switch to D5-LR at 125 cc/h when CBG<250 - replete K as needed - Zofran prn nausea  - NPO  -ED physician ordered bicarbonate drip, I will switch to 50 mEq of sodium bicarbonate, x 1 dose - consult to diabetic educator  Diabetes mellitus without complication [facility]): Recent A1c 10.8.  Patient is supposed to take Jardiance, Amaryl, semaglutide.  Now has DKA. -on DKA protocol  Myocardial injury: Troponin level 26 --> 24.  No chest pain -Aspirin 81 mg daily -Crestor -Trend troponin -Check A1c, FLP  Tachycardia:  heart rate was up to 170s.  Telemetry seems to show A-fib, but not very sure.  The stat EKG showed wide-complex tachycardia, difficult to determine rhythm.  Patient was given 5 mg of  metoprolol, heart rate improved to 140-150s.  Repeated EKG showed sinus tachycardia. -started amiodarone drip -Check TSH -Consulted Dr. Curt Bears of cardiology  Essential hypertension: Blood pressure 120/61.  Patient is taking Flomax and silodosin which is for BPH -IV hydralazine as needed  HLD (hyperlipidemia) -Crestor -Zetia  Leukocytosis: No fever.  No source of infection identified.  Urinalysis negative for UTI.  Possibly reactive. -Follow-up chest x-ray -Follow-up CBC  Benign prostatic hyperplasia without lower urinary tract symptoms -Flomax    DVT ppx: SQ Lovenox  Code Status: Full code  Family Communication:   Yes, patient's wife   at bed side.   Disposition Plan:  Anticipate discharge back to previous environment  Consults called:  Dr. Curt Bears of card  Admission status and Level of care: Stepdown:  as inpt      Dispo: The patient is from: Home              Anticipated d/c is to: Home              Anticipated d/c date is: 2 days              Patient currently is not medically stable to d/c.    Severity of Illness:  The appropriate patient status for this patient is INPATIENT. Inpatient status is judged to be reasonable and necessary in order to provide the required intensity of service to ensure the patient's safety. The patient's presenting symptoms, physical exam findings, and initial radiographic and laboratory data in the context of their chronic comorbidities is felt to place them at high risk for further clinical deterioration. Furthermore, it is not anticipated that the patient will be medically stable for discharge from the hospital within 2 midnights of admission.   * I certify that at the point of admission it is my clinical judgment that the patient will require inpatient hospital care spanning beyond 2 midnights from the point of admission due to high intensity of service, high risk for further deterioration and  high frequency of surveillance  required.*       Date of Service 07/28/2022    Ivor Costa [facility]   If 7PM-7AM, please contact night-coverage [email or web address] 07/28/2022, 5:53 PM

## 2022-07-28 NOTE — ED Provider Notes (Signed)
[facility] Provider Note    Event Date/Time   First MD Initiated Contact with Patient 07/28/22 1403     (approximate)   History   Chief Complaint Hyperglycemia   HPI  Michael Gross is a 67 y.o. male with past medical history of hypertension, hyperlipidemia, and diabetes who presents to the ED complaining of hyperglycemia.  Patient reports that he has been feeling weak over the past couple of days with blood sugars as high as the 500s.  He states he was told to hold all of his diabetic medications prior to his colonoscopy that was performed yesterday.  Patient states that he restarted his diabetic medications earlier today after completing a colonoscopy yesterday, but has continued to have high blood sugars and malaise.  He denies any fevers, cough, or shortness of breath, does report mild aching in the center of his chest.     Physical Exam   Triage Vital Signs: ED Triage Vitals [07/28/22 1402]  Enc Vitals Group     BP 120/61     Pulse Rate (!) 132     Resp 20     Temp 97.6 F (36.4 C)     Temp Source Oral     SpO2 97 %     Weight      Height      Head Circumference      Peak Flow      Pain Score      Pain Loc      Pain Edu?      Excl. in [place]?     Most recent vital signs: Vitals:   07/28/22 1600 07/28/22 1615  BP: 115/68   Pulse: (!) 136 (!) 136  Resp:  20  Temp:    SpO2: 96% 97%    Constitutional: Alert and oriented. Eyes: Conjunctivae are normal. Head: Atraumatic. Nose: No congestion/rhinnorhea. Mouth/Throat: Mucous membranes are very dry. Cardiovascular: Tachycardic, regular rhythm. Grossly normal heart sounds.  2+ radial pulses bilaterally. Respiratory: Normal respiratory effort.  No retractions. Lungs CTAB. Gastrointestinal: Soft and nontender. No distention. Musculoskeletal: No lower extremity tenderness nor edema.  Neurologic:  Normal speech and language. No gross focal neurologic deficits are appreciated.    ED  Results / Procedures / Treatments   Labs (all labs ordered are listed, but only abnormal results are displayed) Labs Reviewed  CBC WITH DIFFERENTIAL/PLATELET - Abnormal; Notable for the following components:      Result Value   WBC 14.2 (*)    Neutro Abs 12.0 (*)    Monocytes Absolute 1.2 (*)    Abs Immature Granulocytes 0.13 (*)    All other components within normal limits  COMPREHENSIVE METABOLIC PANEL - Abnormal; Notable for the following components:   CO2 <7 (*)    Glucose, Bld 377 (*)    Total Bilirubin 1.9 (*)    All other components within normal limits  BLOOD GAS, VENOUS - Abnormal; Notable for the following components:   pH, Ven 7.03 (*)    pCO2, Ven 20 (*)    pO2, Ven 48 (*)    Bicarbonate 5.3 (*)    Acid-base deficit 23.9 (*)    All other components within normal limits  URINALYSIS, ROUTINE W REFLEX MICROSCOPIC - Abnormal; Notable for the following components:   Color, Urine STRAW (*)    APPearance CLEAR (*)    Glucose, UA >=500 (*)    Hgb urine dipstick LARGE (*)    Ketones, ur 80 (*)  Protein, ur 30 (*)    Bacteria, UA RARE (*)    All other components within normal limits  MAGNESIUM - Abnormal; Notable for the following components:   Magnesium 2.6 (*)    All other components within normal limits  CBG MONITORING, ED - Abnormal; Notable for the following components:   Glucose-Capillary 273 (*)    All other components within normal limits  TROPONIN I (HIGH SENSITIVITY) - Abnormal; Notable for the following components:   Troponin I (High Sensitivity) 26 (*)    All other components within normal limits  BASIC METABOLIC PANEL  BASIC METABOLIC PANEL  BASIC METABOLIC PANEL  BASIC METABOLIC PANEL  BETA-HYDROXYBUTYRIC ACID  TROPONIN I (HIGH SENSITIVITY)     EKG  ED ECG REPORT I, Blake Divine, the attending physician, personally viewed and interpreted this ECG.   Date: 07/28/2022  EKG Time: 14:06  Rate: 131  Rhythm: sinus tachycardia  Axis: Normal   Intervals:right bundle branch block  ST&T Change: None  RADIOLOGY Chest x-ray reviewed and interpreted by me with no infiltrate, edema, or effusion.  PROCEDURES:  Critical Care performed: Yes, see critical care procedure note(s)  .Critical Care  Performed by: Blake Divine, MD Authorized by: Blake Divine, MD   Critical care provider statement:    Critical care time (minutes):  30   Critical care time was exclusive of:  Separately billable procedures and treating other patients and teaching time   Critical care was necessary to treat or prevent imminent or life-threatening deterioration of the following conditions:  Endocrine crisis and metabolic crisis   Critical care was time spent personally by me on the following activities:  Development of treatment plan with patient or surrogate, discussions with consultants, evaluation of patient's response to treatment, examination of patient, ordering and review of laboratory studies, ordering and review of radiographic studies, ordering and performing treatments and interventions, pulse oximetry, re-evaluation of patient's condition and review of old charts   I assumed direction of critical care for this patient from another provider in my specialty: no     Care discussed with: admitting provider      MEDICATIONS ORDERED IN ED: Medications  insulin regular, human (MYXREDLIN) 100 units/ 100 mL infusion (3.2 Units/hr Intravenous New Bag/Given 07/28/22 1601)  lactated ringers infusion (has no administration in time range)  dextrose 5 % in lactated ringers infusion (has no administration in time range)  dextrose 50 % solution 0-50 mL (has no administration in time range)  potassium chloride 10 mEq in 100 mL IVPB (10 mEq Intravenous New Bag/Given 07/28/22 1529)  sodium bicarbonate 150 mEq in sterile water 1,150 mL infusion ( Intravenous New Bag/Given 07/28/22 1558)  lactated ringers bolus 1,000 mL (0 mLs Intravenous Stopped 07/28/22 1448)   lactated ringers bolus 1,000 mL (0 mLs Intravenous Stopped 07/28/22 1519)     IMPRESSION / MDM / [place] / ED COURSE  I reviewed the triage vital signs and the nursing notes.                              67 y.o. male with past medical history of hypertension, hyperlipidemia, and diabetes who presents to the ED complaining of increasing weakness, malaise, and hyperglycemia over the past couple of days after he has been holding his diabetic medications for colonoscopy.  Patient's presentation is most consistent with acute presentation with potential threat to life or bodily function.  Differential diagnosis includes, but is not  limited to, DKA, HHS, AKI, electrolyte abnormality, hyperglycemia, dehydration, sepsis.  Patient ill-appearing on arrival, tachycardic but with stable blood pressure and no other findings concerning for sepsis.  He does appear very dehydrated and blood sugars have been elevated, presentation concerning for DKA.  VBG shows significant acidosis consistent with this, patient was hydrated with 2 L of IV fluids.  He reports some chest pain but this seems atypical for ACS, EKG shows sinus tachycardia with right bundle branch block, no ischemic changes noted.  Labs show hyperglycemia with increased anion gap and significant acidosis, consistent with DKA.  No other electrolyte abnormality or AKI noted, we will start patient on insulin drip as well as bicarb drip.  Mild leukocytosis noted without significant anemia, no evidence of infectious process at this time.  Case discussed with hospitalist for admission.      FINAL CLINICAL IMPRESSION(S) / ED DIAGNOSES   Final diagnoses:  Diabetic ketoacidosis without coma associated with type 2 diabetes mellitus ([place])     Rx / DC Orders   ED Discharge Orders     None        Note:  This document was prepared using Dragon voice recognition software and may include unintentional dictation errors.   Blake Divine,  MD 07/28/22 [phone]

## 2022-07-29 ENCOUNTER — Encounter: Payer: Self-pay | Admitting: Internal Medicine

## 2022-07-29 ENCOUNTER — Encounter: Payer: Self-pay | Admitting: Nurse Practitioner

## 2022-07-29 DIAGNOSIS — E1110 Type 2 diabetes mellitus with ketoacidosis without coma: Secondary | ICD-10-CM

## 2022-07-29 DIAGNOSIS — I480 Paroxysmal atrial fibrillation: Secondary | ICD-10-CM

## 2022-07-29 LAB — BLOOD GAS, VENOUS
Acid-base deficit: 18.1 mmol/L — ABNORMAL HIGH (ref 0.0–2.0)
Acid-base deficit: 14.3 mmol/L — ABNORMAL HIGH (ref 0.0–2.0)
Bicarbonate: 7.1 mmol/L — ABNORMAL LOW (ref 20.0–28.0)
Bicarbonate: 9.6 mmol/L — ABNORMAL LOW (ref 20.0–28.0)
O2 Saturation: 98.6 %
O2 Saturation: 98.1 %
Patient temperature: 37
Patient temperature: 37
pCO2, Ven: <18 mmHg — CL (ref 44–60)
pCO2, Ven: 19 mmHg — CL (ref 44–60)
pH, Ven: 7.23 — ABNORMAL LOW (ref 7.25–7.43)
pH, Ven: 7.31 (ref 7.25–7.43)
pO2, Ven: 92 mmHg — ABNORMAL HIGH (ref 32–45)
pO2, Ven: 86 mmHg — ABNORMAL HIGH (ref 32–45)

## 2022-07-29 LAB — GLUCOSE, CAPILLARY
Glucose-Capillary: 155 mg/dL — ABNORMAL HIGH (ref 70–99)
Glucose-Capillary: 162 mg/dL — ABNORMAL HIGH (ref 70–99)
Glucose-Capillary: 168 mg/dL — ABNORMAL HIGH (ref 70–99)
Glucose-Capillary: 168 mg/dL — ABNORMAL HIGH (ref 70–99)
Glucose-Capillary: 177 mg/dL — ABNORMAL HIGH (ref 70–99)
Glucose-Capillary: 178 mg/dL — ABNORMAL HIGH (ref 70–99)
Glucose-Capillary: 185 mg/dL — ABNORMAL HIGH (ref 70–99)
Glucose-Capillary: 193 mg/dL — ABNORMAL HIGH (ref 70–99)
Glucose-Capillary: 211 mg/dL — ABNORMAL HIGH (ref 70–99)
Glucose-Capillary: 288 mg/dL — ABNORMAL HIGH (ref 70–99)
Glucose-Capillary: 293 mg/dL — ABNORMAL HIGH (ref 70–99)

## 2022-07-29 LAB — CBC
HCT: 42.1 % (ref 39.0–52.0)
Hemoglobin: 14.3 g/dL (ref 13.0–17.0)
MCH: 30.6 pg (ref 26.0–34.0)
MCHC: 34 g/dL (ref 30.0–36.0)
MCV: 90 fL (ref 80.0–100.0)
Platelets: 100 10*3/uL — ABNORMAL LOW (ref 150–400)
RBC: 4.68 MIL/uL (ref 4.22–5.81)
RDW: 13.7 % (ref 11.5–15.5)
WBC: 9.4 10*3/uL (ref 4.0–10.5)
nRBC: 0 % (ref 0.0–0.2)

## 2022-07-29 LAB — BASIC METABOLIC PANEL
Anion gap: 13 (ref 5–15)
BUN: 18 mg/dL (ref 8–23)
CO2: 10 mmol/L — ABNORMAL LOW (ref 22–32)
Calcium: 8.3 mg/dL — ABNORMAL LOW (ref 8.9–10.3)
Chloride: 120 mmol/L — ABNORMAL HIGH (ref 98–111)
Creatinine, Ser: 0.87 mg/dL (ref 0.61–1.24)
GFR, Estimated: >60 mL/min (ref >60)
Glucose, Bld: 186 mg/dL — ABNORMAL HIGH (ref 70–99)
Potassium: 3 mmol/L — ABNORMAL LOW (ref 3.5–5.1)
Sodium: 143 mmol/L (ref 135–145)

## 2022-07-29 LAB — COMPREHENSIVE METABOLIC PANEL
ALT: 19 U/L (ref 0–44)
AST: 15 U/L (ref 15–41)
Albumin: 2.7 g/dL — ABNORMAL LOW (ref 3.5–5.0)
Alkaline Phosphatase: 52 U/L (ref 38–126)
Anion gap: 12 (ref 5–15)
BUN: 20 mg/dL (ref 8–23)
CO2: 13 mmol/L — ABNORMAL LOW (ref 22–32)
Calcium: 8.2 mg/dL — ABNORMAL LOW (ref 8.9–10.3)
Chloride: 119 mmol/L — ABNORMAL HIGH (ref 98–111)
Creatinine, Ser: 0.88 mg/dL (ref 0.61–1.24)
GFR, Estimated: >60 mL/min (ref >60)
Glucose, Bld: 203 mg/dL — ABNORMAL HIGH (ref 70–99)
Potassium: 3 mmol/L — ABNORMAL LOW (ref 3.5–5.1)
Sodium: 144 mmol/L (ref 135–145)
Total Bilirubin: 1.9 mg/dL — ABNORMAL HIGH (ref 0.3–1.2)
Total Protein: 5.3 g/dL — ABNORMAL LOW (ref 6.5–8.1)

## 2022-07-29 LAB — MAGNESIUM
Magnesium: 2.4 mg/dL (ref 1.7–2.4)
Magnesium: 2.2 mg/dL (ref 1.7–2.4)

## 2022-07-29 LAB — LIPID PANEL
Cholesterol: 83 mg/dL (ref 0–200)
HDL: 43 mg/dL (ref >40)
LDL Cholesterol: 25 mg/dL (ref 0–99)
Total CHOL/HDL Ratio: 1.9 RATIO
Triglycerides: 77 mg/dL (ref <150)
VLDL: 15 mg/dL (ref 0–40)

## 2022-07-29 LAB — HIV ANTIBODY (ROUTINE TESTING W REFLEX): HIV Screen 4th Generation wRfx: NONREACTIVE

## 2022-07-29 MED ORDER — INSULIN ASPART 100 UNIT/ML IJ SOLN
0.0000 [IU] | Freq: Every day | INTRAMUSCULAR | Status: DC
  Administered 2022-07-29: 3 [IU] via SUBCUTANEOUS
  Filled 2022-07-29: qty 1

## 2022-07-29 MED ORDER — POTASSIUM CHLORIDE 10 MEQ/100ML IV SOLN
10.0000 meq | INTRAVENOUS | Status: AC
  Administered 2022-07-29 (×4): 10 meq via INTRAVENOUS
  Filled 2022-07-29 (×4): qty 100

## 2022-07-29 MED ORDER — POTASSIUM PHOSPHATES 15 MMOLE/5ML IV SOLN
30.0000 mmol | Freq: Once | INTRAVENOUS | Status: AC
  Administered 2022-07-29: 30 mmol via INTRAVENOUS
  Filled 2022-07-29: qty 10

## 2022-07-29 MED ORDER — INSULIN ASPART 100 UNIT/ML IJ SOLN
0.0000 [IU] | Freq: Three times a day (TID) | INTRAMUSCULAR | Status: DC
  Administered 2022-07-29: 5 [IU] via SUBCUTANEOUS
  Administered 2022-07-29: 2 [IU] via SUBCUTANEOUS
  Filled 2022-07-29 (×2): qty 1

## 2022-07-29 MED ORDER — POTASSIUM CHLORIDE CRYS ER 20 MEQ PO TBCR
40.0000 meq | EXTENDED_RELEASE_TABLET | Freq: Once | ORAL | Status: AC
  Administered 2022-07-29: 40 meq via ORAL
  Filled 2022-07-29: qty 2

## 2022-07-29 MED ORDER — LACTATED RINGERS IV BOLUS
1000.0000 mL | Freq: Once | INTRAVENOUS | Status: AC
  Administered 2022-07-29: 1000 mL via INTRAVENOUS

## 2022-07-29 MED ORDER — POTASSIUM CHLORIDE 10 MEQ/100ML IV SOLN: 10.0000 meq | INTRAVENOUS | Status: DC

## 2022-07-29 MED ORDER — POTASSIUM & SODIUM PHOSPHATES 280-160-250 MG PO PACK
1.0000 | PACK | Freq: Three times a day (TID) | ORAL | Status: AC
  Administered 2022-07-29 (×3): 1 via ORAL
  Filled 2022-07-29 (×3): qty 1

## 2022-07-29 MED ORDER — POTASSIUM CHLORIDE 10 MEQ/100ML IV SOLN
10.0000 meq | INTRAVENOUS | Status: AC
  Administered 2022-07-29 (×6): 10 meq via INTRAVENOUS
  Filled 2022-07-29 (×6): qty 100

## 2022-07-29 MED ORDER — INSULIN GLARGINE-YFGN 100 UNIT/ML ~~LOC~~ SOLN
15.0000 [IU] | Freq: Every day | SUBCUTANEOUS | Status: DC
  Administered 2022-07-29: 15 [IU] via SUBCUTANEOUS
  Filled 2022-07-29 (×2): qty 0.15

## 2022-07-29 NOTE — Progress Notes (Signed)
  Progress Note   Patient: Michael Gross [id number] DOB: [birth year] DOA: 07/28/2022     1 DOS: the patient was seen and examined on 07/29/2022   Brief hospital course:  Assessment and Plan: DKA (diabetic ketoacidosis) ([place]):  - Resolved, anion gap 12 - Consult to diabetic educator - Now on clear liquids    Diabetes mellitus without complication ([place]):  - Latest A1c pending  - ASA 81 mg PO daily - Novolog SS ACHS  - Semglee 15 units sq daily   - Lyrica 25 mg PO bid    Myocardial injury  - ASA 81 mg PO daily - Crestor 40 mg PO daily  - Cardio consult pending    Tachycardia: - IV amio drop  - Cardiology was consulted by previous provider    Essential hypertension - Monitor    HLD (hyperlipidemia) - Zetia 10 mg PO daily  - Crestor 40 mg PO daily    Leukocytosis - Resolved    Benign prostatic hyperplasia  - Flomax 0.8 mg PO daily   Hypophosphatemia - Phos-NAK 1 packet PO tid x 3 doses  - IV phos 30 mmol x 1   Hypokalemia  - PO K+ 40 meq x 2    DVT prophylaxis: Lovenox 40 mg sq daily      Subjective: Pt seen and examined at the bedside. This morning anion gap is wnl. He was started on semglee and the insulin drip was turned off 2 hrs later. Diet advanced to clears. Aggressive electrolyte replacement today.  Physical Exam: Vitals:   07/29/22 0919 07/29/22 1000 07/29/22 1100 07/29/22 1200  BP: 111/70 107/64 123/67 125/68  Pulse: 99 100 99 (!) 101  Resp: 20 (!) 22 20 20   Temp:    99.9 F (37.7 C)  TempSrc:    Axillary  SpO2: 96% 94% 95% 96%  Weight:       Physical Exam Constitutional:      Appearance: Normal appearance.  HENT:     Head: Normocephalic.     Mouth/Throat:     Mouth: Mucous membranes are moist.  Cardiovascular:     Rate and Rhythm: Normal rate and regular rhythm.  Pulmonary:     Effort: Pulmonary effort is normal.  Abdominal:     General: Abdomen is flat.     Palpations: Abdomen is soft.  Musculoskeletal:        General:  Normal range of motion.     Cervical back: Neck supple.  Skin:    General: Skin is warm.  Neurological:     Mental Status: He is alert. Mental status is at baseline.  Psychiatric:        Mood and Affect: Mood normal.    Data Reviewed:   Disposition: Status is: Inpatient  Planned Discharge Destination: Home    Time spent: 35 minutes  Author: Lucienne Minks , MD 07/29/2022 12:19 PM  For on call review [email or web address].

## 2022-07-29 NOTE — Inpatient Diabetes Management (Addendum)
Inpatient Diabetes Program Recommendations  AACE/ADA: New Consensus Statement on Inpatient Glycemic Control (2015)  Target Ranges:  Prepandial:   less than 140 mg/dL      Peak postprandial:   less than 180 mg/dL (1-2 hours)      Critically ill patients:  140 - 180 mg/dL   Lab Results  Component Value Date   GLUCAP 155 (H) 07/29/2022   HGBA1C 10.8 (A) 07/13/2022    Review of Glycemic Control  Latest Reference Range & Units 07/29/22 05:35 07/29/22 06:35 07/29/22 08:30  Glucose-Capillary 70 - 99 mg/dL 168 (H) 162 (H) 155 (H)  (H): Data is abnormally high Diabetes history: Type 2 DM Outpatient Diabetes medications: Jardiance 25 mg QD, Amaryl 4 mg BID, Ozempic 0.5 mg qwk, Soliqua (NT) Current orders for Inpatient glycemic control: IV insulin to transition to  Semglee 15 units QD, Novolog 0-9 units TID & HS  Inpatient Diabetes Program Recommendations:    Noted plan for transition and previous CMP result with CO2 of 10. Secure chat sent to MD. Consider adding BMP/beta hydroxybutyric acid to determine if transition appropriate. At discharge: Anticipate need for basal insulin and would be guarded with resuming Jardiance.  Spoke with patient via phone regarding outpatient diabetes management. Patient feels that this is related to missing doses of Ozempic due to recent colonoscopy. Verified home medications. Of note, a month ago patient was told by PCP to discontinue [place] "to try something different". Reviewed patient's current A1c of 10.8%. Explained what a A1c is and what it measures. Also reviewed goal A1c with patient, importance of good glucose control @ home, and blood sugar goals. Reviewed patho of DM, DKA, role of pancreas, differences between GLPs & basal insulin, impact of missing doses, impact of one missed GLP dose, Jardiance while prepping for colonoscopy, current hyperglycemia, plan for transition, anticipate need for basal at discharge, vascular changes and commorbidities.   Patient has a meter at home. Reports checks have been elevated. Reviewed recommended frequency and when to call MD. Encouraged to move PCP appointment up for closer management.  Patient in agreement and has no further questions.   Thanks, Bronson Curb, MSN, RNC-OB Diabetes Coordinator [phone] (8a-5p)

## 2022-07-29 NOTE — Consult Note (Signed)
Cardiology Consult    Patient ID: EDIL RUFUS MRN: [id number], DOB/AGE: 67-09-1955   Admit date: 07/28/2022 Date of Consult: 07/29/2022  Primary Physician: Jonetta Osgood, NP Primary Cardiologist: Ida Rogue, MD - new Requesting Provider: Z. Feliberto Gottron, MD  Patient Profile    Michael Gross is a 67 y.o. male with a history of diabetes, HL, and BPH, who is being seen today for the evaluation of PAF w/ RVR in the setting of DKA at the request of Dr. Feliberto Gottron.  Past Medical History   Past Medical History:  Diagnosis Date   Arthritis    ankles   Basal cell carcinoma    BPH (benign prostatic hyperplasia)    Complication of anesthesia    slow to wake   Diabetes ([place])    a. diagnosed in his 40's.   History of echocardiogram    a. 11/2019 Echo: EF 60-65%, no rwma, nl RV fxn, no significant valvular abnormality.   Hyperlipidemia    PAF (paroxysmal atrial fibrillation) ([place])    a. 07/2022 noted in the setting of DKA - brief.  CHA2DS2VASc = 3-->OAC deferred for now.   Vertigo    none for over 15 years    Past Surgical History:  Procedure Laterality Date   COLONOSCOPY WITH PROPOFOL N/A 07/27/2022   Procedure: COLONOSCOPY WITH PROPOFOL;  Surgeon: Lin Landsman, MD;  Location: [facility];  Service: Endoscopy;  Laterality: N/A;  Diabetic   HERNIA REPAIR     2003   KNEE SURGERY     1998   MOHS SURGERY     2003   SHOULDER ARTHROSCOPY WITH OPEN ROTATOR CUFF REPAIR AND DISTAL CLAVICLE ACROMINECTOMY Left 08/11/2021   Procedure: SHOULDER ARTHROSCOPY WITH OPEN ROTATOR CUFF REPAIR AND DISTAL CLAVICLE ACROMINECTOMY;  Surgeon: Thornton Park, MD;  Location: [facility];  Service: Orthopedics;  Laterality: Left;     Allergies  No Known Allergies  History of Present Illness    67 year old male with a history of diabetes, hyperlipidemia, and BPH.  He says he was diagnosed with diabetes greater than 20 years ago.  He has no prior cardiac history.  In the setting of being  scheduled for elective colonoscopy on March 21, he understood that he was to hold all of his diabetes medicines for a week prior to the procedure.  In that setting, he noted elevated blood glucoses as well as polyuria, polydipsia, and tachycardia.  On arrival to his colonoscopy on March 21, he was noted to be dehydrated by anesthesia staff, and he was given a liter of fluid.  Per notes, an ECG was performed and showed sinus tachycardia without acute changes.  This colonoscopy was then performed and showed diverticulosis.  Following his colonoscopy, he began feeling progressively weak.  Blood glucoses were greater than 500 despite resuming his home medications.  He presented to the emergency department where he was noted to be tachycardic.  Rates were at times into the 170s, and ECG showed atrial fibrillation with rapid ventricular response with right bundle branch block (old).  He was initially treated with intravenous metoprolol with some improvement in rate and subsequently placed on IV amiodarone.  He has since converted to sinus rhythm with frequent PACs.  He did notice palpitations beginning earlier in the week and worsening on the day of admission.  His only complaint at this time is that of ongoing nausea and malaise.  He denies any prior history of chest pain or dyspnea.  Troponins are mildly elevated to  a peak of 38.  Inpatient Medications     aspirin EC  81 mg Oral Daily   Chlorhexidine Gluconate Cloth  6 each Topical Q0600   enoxaparin (LOVENOX) injection  40 mg Subcutaneous Q24H   ezetimibe  10 mg Oral Daily   insulin aspart  0-5 Units Subcutaneous QHS   insulin aspart  0-9 Units Subcutaneous TID WC   insulin glargine-yfgn  15 Units Subcutaneous Daily   potassium & sodium phosphates  1 packet Oral TID AC & HS   potassium chloride  40 mEq Oral Once   pregabalin  25 mg Oral BID   rosuvastatin  40 mg Oral Daily   tamsulosin  0.8 mg Oral Daily   vitamin B-12  100 mcg Oral Daily    Family  History    Family History  Problem Relation Age of Onset   Heart disease Father    Prostate cancer Neg Hx    Kidney cancer Neg Hx    Bladder Cancer Neg Hx    He indicated that the status of his father is unknown. He indicated that the status of his neg hx is unknown.   Social History    Social History   Socioeconomic History   Marital status: Married    Spouse name: [name]   Number of children: 4   Years of education: Not on file   Highest education level: Not on file  Occupational History   Not on file  Tobacco Use   Smoking status: Never   Smokeless tobacco: Never  Vaping Use   Vaping Use: Never used  Substance and Sexual Activity   Alcohol use: Yes    Comment: rare   Drug use: No   Sexual activity: Yes  Other Topics Concern   Not on file  Social History Narrative   Not on file   Social Determinants of Health   Financial Resource Strain: Not on file  Food Insecurity: Not on file  Transportation Needs: Not on file  Physical Activity: Not on file  Stress: Not on file  Social Connections: Not on file  Intimate Partner Violence: Not on file     Review of Systems    General: Generalized malaise with polydipsia, polyuria, polyphagia.  No chills, fever, night sweats or weight changes.  Cardiovascular:  +++ Palpitations.  No chest pain, dyspnea on exertion, edema, orthopnea, paroxysmal nocturnal dyspnea. Dermatological: No rash, lesions/masses Respiratory: No cough, dyspnea Urologic: No hematuria, dysuria Abdominal:   No nausea, vomiting, diarrhea, bright red blood per rectum, melena, or hematemesis Neurologic:  No visual changes, changes in mental status. All other systems reviewed and are otherwise negative except as noted above.  Physical Exam    Blood pressure 132/62, pulse (!) 107, temperature 99.9 F (37.7 C), temperature source Axillary, resp. rate (!) 21, weight 85.9 kg, SpO2 92 %.  General: Pleasant, NAD Psych: Normal affect. Neuro: Alert and  oriented X 3. Moves all extremities spontaneously. HEENT: Normal  Neck: Supple without bruits or JVD. Lungs:  Resp regular and unlabored, CTA. Heart: RRR, mildly tachycardic no s3, s4, or murmurs. Abdomen: Soft, non-tender, non-distended, BS + x 4.  Extremities: No clubbing, cyanosis or edema. DP/PT2+, Radials 2+ and equal bilaterally.  Labs    Cardiac Enzymes Recent Labs  Lab 07/28/22 1409 07/28/22 1633 07/28/22 1839 07/28/22 1938 07/28/22 2242  TROPONINIHS 26* 24* 38* 35* 47*     Lab Results  Component Value Date   WBC 9.4 07/29/2022   HGB 14.3 07/29/2022  HCT 42.1 07/29/2022   MCV 90.0 07/29/2022   PLT 100 (L) 07/29/2022    Recent Labs  Lab 07/29/22 0950  NA 144  K 3.0*  CL 119*  CO2 13*  BUN 20  CREATININE 0.88  CALCIUM 8.2*  PROT 5.3*  BILITOT 1.9*  ALKPHOS 52  ALT 19  AST 15  GLUCOSE 203*   Lab Results  Component Value Date   CHOL 83 07/29/2022   HDL 43 07/29/2022   LDLCALC 25 07/29/2022   TRIG 77 07/29/2022     Radiology Studies    DG Chest Port 1 View  Result Date: 07/28/2022 CLINICAL DATA:  Leukocytosis. EXAM: PORTABLE CHEST 1 VIEW COMPARISON:  Chest radiograph dated 07/28/2022. FINDINGS: No focal consolidation, pleural effusion or pneumothorax. The cardiac silhouette is within limits. Atherosclerotic calcification of the aorta. No acute osseous pathology. Degenerative changes of the spine. IMPRESSION: No active disease. Electronically Signed   By: Anner Crete M.D.   On: 07/28/2022 17:36   DG Chest Portable 1 View  Result Date: 07/28/2022 CLINICAL DATA:  Chest pain EXAM: PORTABLE CHEST 1 VIEW COMPARISON:  X-ray 05/19/2019 FINDINGS: Calcified aorta. No consolidation, pneumothorax or effusion. No edema. Normal cardiopericardial silhouette. Overlapping cardiac leads. Degenerative changes are seen along the spine. IMPRESSION: No acute cardiopulmonary disease Electronically Signed   By: Jill Side M.D.   On: 07/28/2022 14:35    ECG & Cardiac  Imaging    Afib, 151, RBBB, LAFB - personally reviewed. Tele - predominantly sinus rhythm w/ freq PACs at times.  Assessment & Plan    1.  Paroxysmal atrial fibrillation: Patient presented after 1 week of not taking his diabetes medications further complicated by bowel prep for colonoscopy resulting in significant dehydration and electrolyte abnormalities, as well as hyperglycemia.  In that setting, he presented to the emergency department on March 22 and was found to be in atrial fibrillation with rapid ventricular response.  This converted on IV amiodarone.  CHA2DS2-VASc equals 3.  Currently maintaining sinus rhythm with frequent PACs.  He continues to feel poorly related to DKA.  Will continue intravenous amiodarone for the time being with the plan to transition him to oral beta-blocker only prior to discharge.  Follow-up echocardiogram.  Given short duration of A-fib and clear trigger, will hold off on anticoagulation for the time being.  2.  Demand ischemia: In the setting of DKA and rapid atrial fibrillation, troponin up to 38.  He denies any prior history of chest pain or dyspnea on exertion.  Follow-up echo.  Given long history of diabetes, low threshold for ischemic testing based on echo results.  Continue aspirin and statin.  As blood pressure stabilized, look to add beta-blocker.  3.  DKA: Per medicine team.  4.  Electrolyte abnormalities: Hypokalemic and hyperchloremic in the setting of DKA.  Management per medicine team.  Risk Assessment/Risk Scores:          CHA2DS2-VASc Score = 3   This indicates a 3.2% annual risk of stroke. The patient's score is based upon: CHF History: 0 HTN History: 1 Diabetes History: 1 Stroke History: 0 Vascular Disease History: 0 Age Score: 1 Gender Score: 0     Signed, Murray Hodgkins, NP 07/29/2022, 1:42 PM  For questions or updates, please contact   Please consult [email or web address] for contact info under Cardiology/STEMI.

## 2022-07-29 NOTE — Progress Notes (Signed)
Assumed pt care at this time

## 2022-07-30 ENCOUNTER — Inpatient Hospital Stay (HOSPITAL_COMMUNITY)
Admit: 2022-07-30 | Discharge: 2022-07-30 | Disposition: A | Discharge status: Home / Self Care | Payer: Medicare Other | Attending: Cardiovascular Disease | Admitting: Cardiovascular Disease

## 2022-07-30 DIAGNOSIS — I4891 Unspecified atrial fibrillation: Secondary | ICD-10-CM | POA: Diagnosis not present

## 2022-07-30 DIAGNOSIS — E876 Hypokalemia: Secondary | ICD-10-CM | POA: Diagnosis not present

## 2022-07-30 DIAGNOSIS — I480 Paroxysmal atrial fibrillation: Secondary | ICD-10-CM | POA: Diagnosis not present

## 2022-07-30 DIAGNOSIS — E1110 Type 2 diabetes mellitus with ketoacidosis without coma: Secondary | ICD-10-CM | POA: Diagnosis not present

## 2022-07-30 LAB — BASIC METABOLIC PANEL
Anion gap: 15 (ref 5–15)
Anion gap: 11 (ref 5–15)
Anion gap: 6 (ref 5–15)
Anion gap: 7 (ref 5–15)
BUN: 35 mg/dL — ABNORMAL HIGH (ref 8–23)
BUN: 32 mg/dL — ABNORMAL HIGH (ref 8–23)
BUN: 28 mg/dL — ABNORMAL HIGH (ref 8–23)
BUN: 26 mg/dL — ABNORMAL HIGH (ref 8–23)
CO2: 7 mmol/L — ABNORMAL LOW (ref 22–32)
CO2: 10 mmol/L — ABNORMAL LOW (ref 22–32)
CO2: 12 mmol/L — ABNORMAL LOW (ref 22–32)
CO2: 15 mmol/L — ABNORMAL LOW (ref 22–32)
Calcium: 8.3 mg/dL — ABNORMAL LOW (ref 8.9–10.3)
Calcium: 8.1 mg/dL — ABNORMAL LOW (ref 8.9–10.3)
Calcium: 7.9 mg/dL — ABNORMAL LOW (ref 8.9–10.3)
Calcium: 7.9 mg/dL — ABNORMAL LOW (ref 8.9–10.3)
Chloride: 120 mmol/L — ABNORMAL HIGH (ref 98–111)
Chloride: 121 mmol/L — ABNORMAL HIGH (ref 98–111)
Chloride: 121 mmol/L — ABNORMAL HIGH (ref 98–111)
Chloride: 117 mmol/L — ABNORMAL HIGH (ref 98–111)
Creatinine, Ser: 0.96 mg/dL (ref 0.61–1.24)
Creatinine, Ser: 0.92 mg/dL (ref 0.61–1.24)
Creatinine, Ser: 0.85 mg/dL (ref 0.61–1.24)
Creatinine, Ser: 0.75 mg/dL (ref 0.61–1.24)
GFR, Estimated: >60 mL/min (ref >60)
GFR, Estimated: >60 mL/min (ref >60)
GFR, Estimated: >60 mL/min (ref >60)
GFR, Estimated: >60 mL/min (ref >60)
Glucose, Bld: 202 mg/dL — ABNORMAL HIGH (ref 70–99)
Glucose, Bld: 191 mg/dL — ABNORMAL HIGH (ref 70–99)
Glucose, Bld: 184 mg/dL — ABNORMAL HIGH (ref 70–99)
Glucose, Bld: 183 mg/dL — ABNORMAL HIGH (ref 70–99)
Potassium: 3.3 mmol/L — ABNORMAL LOW (ref 3.5–5.1)
Potassium: 2.9 mmol/L — ABNORMAL LOW (ref 3.5–5.1)
Potassium: 2.9 mmol/L — ABNORMAL LOW (ref 3.5–5.1)
Potassium: 3.3 mmol/L — ABNORMAL LOW (ref 3.5–5.1)
Sodium: 142 mmol/L (ref 135–145)
Sodium: 142 mmol/L (ref 135–145)
Sodium: 139 mmol/L (ref 135–145)
Sodium: 139 mmol/L (ref 135–145)

## 2022-07-30 LAB — CBC
HCT: 36 % — ABNORMAL LOW (ref 39.0–52.0)
Hemoglobin: 12.1 g/dL — ABNORMAL LOW (ref 13.0–17.0)
MCH: 30.7 pg (ref 26.0–34.0)
MCHC: 33.6 g/dL (ref 30.0–36.0)
MCV: 91.4 fL (ref 80.0–100.0)
Platelets: 132 10*3/uL — ABNORMAL LOW (ref 150–400)
RBC: 3.94 MIL/uL — ABNORMAL LOW (ref 4.22–5.81)
RDW: 14 % (ref 11.5–15.5)
WBC: 8 10*3/uL (ref 4.0–10.5)
nRBC: 0 % (ref 0.0–0.2)

## 2022-07-30 LAB — GLUCOSE, CAPILLARY
Glucose-Capillary: 181 mg/dL — ABNORMAL HIGH (ref 70–99)
Glucose-Capillary: 181 mg/dL — ABNORMAL HIGH (ref 70–99)
Glucose-Capillary: 152 mg/dL — ABNORMAL HIGH (ref 70–99)
Glucose-Capillary: 160 mg/dL — ABNORMAL HIGH (ref 70–99)
Glucose-Capillary: 182 mg/dL — ABNORMAL HIGH (ref 70–99)
Glucose-Capillary: 162 mg/dL — ABNORMAL HIGH (ref 70–99)
Glucose-Capillary: 149 mg/dL — ABNORMAL HIGH (ref 70–99)
Glucose-Capillary: 181 mg/dL — ABNORMAL HIGH (ref 70–99)
Glucose-Capillary: 164 mg/dL — ABNORMAL HIGH (ref 70–99)
Glucose-Capillary: 168 mg/dL — ABNORMAL HIGH (ref 70–99)
Glucose-Capillary: 141 mg/dL — ABNORMAL HIGH (ref 70–99)
Glucose-Capillary: 152 mg/dL — ABNORMAL HIGH (ref 70–99)
Glucose-Capillary: 156 mg/dL — ABNORMAL HIGH (ref 70–99)

## 2022-07-30 LAB — ECHOCARDIOGRAM COMPLETE
AR max vel: 2.45 cm2
AV Area VTI: 2.61 cm2
AV Area mean vel: 2.38 cm2
AV Mean grad: 11.5 mmHg
AV Peak grad: 21.3 mmHg
Ao pk vel: 2.31 m/s
Area-P 1/2: 4.21 cm2
S' Lateral: 2.7 cm
Weight: 3030 oz

## 2022-07-30 LAB — PHOSPHORUS
Phosphorus: <1 mg/dL — CL (ref 2.5–4.6)
Phosphorus: 2.1 mg/dL — ABNORMAL LOW (ref 2.5–4.6)

## 2022-07-30 LAB — COMPREHENSIVE METABOLIC PANEL
ALT: 17 U/L (ref 0–44)
AST: 12 U/L — ABNORMAL LOW (ref 15–41)
Albumin: 2.5 g/dL — ABNORMAL LOW (ref 3.5–5.0)
Alkaline Phosphatase: 46 U/L (ref 38–126)
Anion gap: 18 — ABNORMAL HIGH (ref 5–15)
BUN: 38 mg/dL — ABNORMAL HIGH (ref 8–23)
CO2: 8 mmol/L — ABNORMAL LOW (ref 22–32)
Calcium: 8.3 mg/dL — ABNORMAL LOW (ref 8.9–10.3)
Chloride: 119 mmol/L — ABNORMAL HIGH (ref 98–111)
Creatinine, Ser: 0.95 mg/dL (ref 0.61–1.24)
GFR, Estimated: >60 mL/min (ref >60)
Glucose, Bld: 226 mg/dL — ABNORMAL HIGH (ref 70–99)
Potassium: 3.5 mmol/L (ref 3.5–5.1)
Sodium: 145 mmol/L (ref 135–145)
Total Bilirubin: 2.3 mg/dL — ABNORMAL HIGH (ref 0.3–1.2)
Total Protein: 5.1 g/dL — ABNORMAL LOW (ref 6.5–8.1)

## 2022-07-30 LAB — MAGNESIUM: Magnesium: 2.3 mg/dL (ref 1.7–2.4)

## 2022-07-30 LAB — BETA-HYDROXYBUTYRIC ACID
Beta-Hydroxybutyric Acid: 7.65 mmol/L — ABNORMAL HIGH (ref 0.05–0.27)
Beta-Hydroxybutyric Acid: 4.07 mmol/L — ABNORMAL HIGH (ref 0.05–0.27)

## 2022-07-30 LAB — C-REACTIVE PROTEIN: CRP: 0.7 mg/dL (ref <1.0)

## 2022-07-30 MED ORDER — DILTIAZEM HCL 30 MG PO TABS
60.0000 mg | ORAL_TABLET | Freq: Three times a day (TID) | ORAL | Status: DC
  Administered 2022-07-30 – 2022-08-01 (×6): 60 mg via ORAL
  Filled 2022-07-30 (×6): qty 2

## 2022-07-30 MED ORDER — POTASSIUM CHLORIDE 20 MEQ PO PACK: 40.0000 meq | PACK | Freq: Two times a day (BID) | ORAL | Status: DC

## 2022-07-30 MED ORDER — POTASSIUM CHLORIDE 10 MEQ/100ML IV SOLN
10.0000 meq | INTRAVENOUS | Status: AC
  Administered 2022-07-30 (×2): 10 meq via INTRAVENOUS
  Filled 2022-07-30 (×2): qty 100

## 2022-07-30 MED ORDER — AMIODARONE IV BOLUS ONLY 150 MG/100ML
150.0000 mg | Freq: Once | INTRAVENOUS | Status: AC
  Administered 2022-07-30: 150 mg via INTRAVENOUS
  Filled 2022-07-30: qty 100

## 2022-07-30 MED ORDER — KCL-LACTATED RINGERS-D5W 20 MEQ/L IV SOLN
INTRAVENOUS | Status: DC
  Filled 2022-07-30 (×4): qty 1000

## 2022-07-30 MED ORDER — METOPROLOL TARTRATE 5 MG/5ML IV SOLN: 5.0000 mg | INTRAVENOUS | Status: DC | PRN

## 2022-07-30 MED ORDER — DEXTROSE IN LACTATED RINGERS 5 % IV SOLN: INTRAVENOUS | Status: DC

## 2022-07-30 MED ORDER — POTASSIUM CHLORIDE 10 MEQ/100ML IV SOLN
10.0000 meq | INTRAVENOUS | Status: DC
  Filled 2022-07-30 (×2): qty 100

## 2022-07-30 MED ORDER — LACTATED RINGERS IV SOLN: INTRAVENOUS | Status: DC

## 2022-07-30 MED ORDER — PANTOPRAZOLE SODIUM 40 MG IV SOLR
40.0000 mg | INTRAVENOUS | Status: DC
  Administered 2022-07-30: 40 mg via INTRAVENOUS
  Filled 2022-07-30 (×2): qty 10

## 2022-07-30 MED ORDER — POTASSIUM CHLORIDE 2 MEQ/ML IV SOLN
INTRAVENOUS | Status: DC
  Filled 2022-07-30: qty 1000

## 2022-07-30 MED ORDER — LACTATED RINGERS IV BOLUS
20.0000 mL/kg | Freq: Once | INTRAVENOUS | Status: AC
  Administered 2022-07-30: 1718 mL via INTRAVENOUS

## 2022-07-30 MED ORDER — POTASSIUM CHLORIDE CRYS ER 20 MEQ PO TBCR
40.0000 meq | EXTENDED_RELEASE_TABLET | Freq: Two times a day (BID) | ORAL | Status: DC
  Administered 2022-07-30: 40 meq via ORAL
  Filled 2022-07-30: qty 2

## 2022-07-30 MED ORDER — KCL-LACTATED RINGERS-D5W 20 MEQ/L IV SOLN
INTRAVENOUS | Status: DC
  Filled 2022-07-30: qty 1000

## 2022-07-30 MED ORDER — POTASSIUM PHOSPHATES 15 MMOLE/5ML IV SOLN
15.0000 mmol | Freq: Once | INTRAVENOUS | Status: AC
  Administered 2022-07-30: 15 mmol via INTRAVENOUS
  Filled 2022-07-30: qty 5

## 2022-07-30 MED ORDER — POTASSIUM CHLORIDE CRYS ER 20 MEQ PO TBCR
40.0000 meq | EXTENDED_RELEASE_TABLET | ORAL | Status: AC
  Administered 2022-07-30 (×2): 40 meq via ORAL
  Filled 2022-07-30 (×2): qty 2

## 2022-07-30 MED ORDER — INSULIN REGULAR(HUMAN) IN NACL 100-0.9 UT/100ML-% IV SOLN
INTRAVENOUS | Status: DC
  Administered 2022-07-30: 6.5 [IU]/h via INTRAVENOUS
  Administered 2022-07-31: 3 [IU]/h via INTRAVENOUS
  Filled 2022-07-30 (×2): qty 100

## 2022-07-30 MED ORDER — DEXTROSE 50 % IV SOLN: 0.0000 mL | INTRAVENOUS | Status: DC | PRN

## 2022-07-30 NOTE — Hospital Course (Signed)
Michael Gross is a 67 y.o. male with medical history significant of diabetes mellitus, hypertension, hyperlipidemia, BPH, vertigo, who presents with weakness.   Patient recently had a colonoscopy, most of diabetic medication was on hold, he has been running high on glucose. Arriving the hospital, patient was diagnosed with DKA with a glucose of 377 bicarb less than 7, anion gap of 18.  He was initially placed on insulin drip, but improved, insulin drip was taken off 3/23.  Then he developed worsening metabolic acidosis with elevated anion gap again in the morning 3/24.  Insulin drip was restarted. Patient also had a new onset atrial fibrillation with RVR, amiodarone drip was started, cardiology consult obtained. Condition improved, transition to subcu insulin on 3/25.

## 2022-07-30 NOTE — Progress Notes (Signed)
Patient alert and oriented x4. Patient had episode of Afibb RVR, MD made aware. New orders placed by cardiology. Patient converted back to NSR. HR in 90s. BP stable, on room air. Patient is very fatigued. Insulin restarted per MD orders. Orders clarified, D5LR started, bolus stopped per MD. MD made ware of K levels, new orders placed. K levels followed and MD notified of levels throughout shift. Per MD and pharm, give K runs after Kphos completes. MD made aware of Endo recommendations, continue running per MD.

## 2022-07-30 NOTE — TOC CM/SW Note (Signed)
  Transition of Care [facility]) Screening Note   Patient Details  Name: Michael Gross Date of Birth: 10/04/1955   Transition of Care [facility]) CM/SW Contact:    Candie Chroman, LCSW Phone Number: 07/30/2022, 8:38 AM    Transition of Care Department [facility]) has reviewed patient and no TOC needs have been identified at this time. We will continue to monitor patient advancement through interdisciplinary progression rounds. If new patient transition needs arise, please place a TOC consult.

## 2022-07-30 NOTE — Progress Notes (Signed)
  Echocardiogram 2D Echocardiogram has been performed.  Claretta Fraise 07/30/2022, 8:20 AM

## 2022-07-30 NOTE — Progress Notes (Signed)
Rounding Note    Patient Name: Michael Gross Date of Encounter: 07/30/2022  [place] Cardiologist: Ida Rogue, MD   Subjective   Reports not feeling well Family at bedside, continued polydipsia, polyuria, nausea, general malaise Converting to atrial fibrillation with RVR at 9 AM, rate 130s and higher Remains on amiodarone infusion 0.5 mg/min  Inpatient Medications    Scheduled Meds:  aspirin EC  81 mg Oral Daily   Chlorhexidine Gluconate Cloth  6 each Topical Q0600   diltiazem  60 mg Oral Q8H   enoxaparin (LOVENOX) injection  40 mg Subcutaneous Q24H   ezetimibe  10 mg Oral Daily   pantoprazole (PROTONIX) IV  40 mg Intravenous Q24H   pregabalin  25 mg Oral BID   rosuvastatin  40 mg Oral Daily   tamsulosin  0.8 mg Oral Daily   vitamin B-12  100 mcg Oral Daily   Continuous Infusions:  sodium chloride Stopped (07/30/22 0406)   amiodarone 60 mg/hr (07/30/22 1010)   dextrose 5% lactated ringers 125 mL/hr at 07/30/22 0929   insulin 6.5 Units/hr (07/30/22 0852)   lactated ringers     potassium chloride 10 mEq (07/30/22 1148)   potassium PHOSPHATE IVPB (in mmol)     PRN Meds: sodium chloride, acetaminophen, dextrose, hydrALAZINE, metoprolol tartrate, ondansetron (ZOFRAN) IV   Vital Signs    Vitals:   07/30/22 0700 07/30/22 0800 07/30/22 0900 07/30/22 1000  BP: 114/68 124/60 100/64 124/74  Pulse: 93 98 96 96  Resp: 19 19 15 19   Temp:  97.9 F (36.6 C)    TempSrc:  Oral    SpO2: 97% 96% 96% 96%  Weight:        Intake/Output Summary (Last 24 hours) at 07/30/2022 1153 Last data filed at 07/30/2022 0800 Gross per 24 hour  Intake 3621.03 ml  Output 7925 ml  Net -4303.97 ml      07/28/2022    5:55 PM 07/27/2022    8:31 AM 07/19/2022   11:12 AM  Last 3 Weights  Weight (lbs) 189 lb 6 oz 180 lb 14.4 oz 193 lb  Weight (kg) 85.9 kg 82.056 kg 87.544 kg      Telemetry    Atrial fibrillation rate 130 bpm starting at 9 AM- Personally  Reviewed  ECG     - Personally Reviewed  Physical Exam   GEN: No acute distress.   Neck: No JVD Cardiac: Irregularly irregular no murmurs, rubs, or gallops.  Respiratory: Clear to auscultation bilaterally. GI: Soft, nontender, non-distended  MS: No edema; No deformity. Neuro:  Nonfocal  Psych: Normal affect   Labs    High Sensitivity Troponin:   Recent Labs  Lab 07/28/22 1409 07/28/22 1633 07/28/22 1839 07/28/22 1938 07/28/22 2242  TROPONINIHS 26* 24* 38* 35* 47*     Chemistry Recent Labs  Lab 07/28/22 1409 07/28/22 1633 07/28/22 2245 07/29/22 0336 07/29/22 0950 07/30/22 0413 07/30/22 0839  NA 136   < >  --    < > 144 145 142  K 4.0   < >  --    < > 3.0* 3.5 3.3*  CL 109   < >  --    < > 119* 119* 120*  CO2 <7*   < >  --    < > 13* 8* 7*  GLUCOSE 377*   < >  --    < > 203* 226* 202*  BUN 23   < >  --    < >  20 38* 35*  CREATININE 1.19   < >  --    < > 0.88 0.95 0.96  CALCIUM 9.3   < >  --    < > 8.2* 8.3* 8.3*  MG 2.6*  --  2.4  --  2.2 2.3  --   PROT 6.8  --   --   --  5.3* 5.1*  --   ALBUMIN 3.7  --   --   --  2.7* 2.5*  --   AST 17  --   --   --  15 12*  --   ALT 27  --   --   --  19 17  --   ALKPHOS 80  --   --   --  52 46  --   BILITOT 1.9*  --   --   --  1.9* 2.3*  --   GFRNONAA >60   < >  --    < > >60 >60 >60  ANIONGAP NOT CALCULATED   < >  --    < > 12 18* 15   < > = values in this interval not displayed.    Lipids  Recent Labs  Lab 07/29/22 0336  CHOL 83  TRIG 77  HDL 43  LDLCALC 25  CHOLHDL 1.9    Hematology Recent Labs  Lab 07/28/22 1409 07/29/22 0336 07/30/22 0413  WBC 14.2* 9.4 8.0  RBC 5.39 4.68 3.94*  HGB 16.2 14.3 12.1*  HCT 50.4 42.1 36.0*  MCV 93.5 90.0 91.4  MCH 30.1 30.6 30.7  MCHC 32.1 34.0 33.6  RDW 13.5 13.7 14.0  PLT 163 100* 132*   Thyroid  Recent Labs  Lab 07/28/22 1409  TSH 0.320*    BNPNo results for input(s): "BNP", "PROBNP" in the last 168 hours.  DDimer No results for input(s): "DDIMER" in the  last 168 hours.   Radiology    DG Chest Port 1 View  Result Date: 07/28/2022 CLINICAL DATA:  Leukocytosis. EXAM: PORTABLE CHEST 1 VIEW COMPARISON:  Chest radiograph dated 07/28/2022. FINDINGS: No focal consolidation, pleural effusion or pneumothorax. The cardiac silhouette is within limits. Atherosclerotic calcification of the aorta. No acute osseous pathology. Degenerative changes of the spine. IMPRESSION: No active disease. Electronically Signed   By: Anner Crete M.D.   On: 07/28/2022 17:36   DG Chest Portable 1 View  Result Date: 07/28/2022 CLINICAL DATA:  Chest pain EXAM: PORTABLE CHEST 1 VIEW COMPARISON:  X-ray 05/19/2019 FINDINGS: Calcified aorta. No consolidation, pneumothorax or effusion. No edema. Normal cardiopericardial silhouette. Overlapping cardiac leads. Degenerative changes are seen along the spine. IMPRESSION: No acute cardiopulmonary disease Electronically Signed   By: Jill Side M.D.   On: 07/28/2022 14:35    Cardiac Studies   Echocardiogram pending  Patient Profile     Michael Gross is a 67 y.o. male with a history of diabetes, HL, and BPH, who is being seen today for the evaluation of PAF w/ RVR in the setting of DKA   Assessment & Plan    Paroxysmal atrial fibrillation He reports holding diabetes medication 4 days prior to colonoscopy Following colonoscopy in the setting of malaise, DKA, glucose numbers measuring over 500, hypovolemia, electrolyte  derangements -On arrival to the hospital noted to be in atrial fibrillation, given IV metoprolol and amiodarone infusion, broke to normal sinus rhythm Infusion was continued at 0.5 mg/min -This morning back to atrial fibrillation with RVR, low potassium, on significant IV fluids, back on insulin  infusion -Reload amiodarone 150 bolus, infusion back to 1 mg/min in effort to restore normal sinus rhythm   Demand ischemia Minimally elevated troponin in the setting of DKA, tachycardia Echocardiogram completed,  prelim showing normal LV function No plans for immediate ischemic workup   DKA On IV fluids, insulin infusion Continued general malaise with nausea, anorexia   Electrolyte derangements Hypokalemia/low bicarb In the setting of DKA   Case discussed with patient, nurse, family at the bedside  Total encounter time more than 50 minutes  Greater than 50% was spent in counseling and coordination of care with the patient  For questions or updates, please contact [place] Please consult [email or web address] for contact info under        Signed, Ida Rogue, MD  07/30/2022, 11:53 AM

## 2022-07-30 NOTE — Progress Notes (Signed)
  Progress Note   Patient: Michael Gross [id number] DOB: [birth year] DOA: 07/28/2022     2 DOS: the patient was seen and examined on 07/30/2022   Brief hospital course: Michael Gross is a 67 y.o. male with medical history significant of diabetes mellitus, hypertension, hyperlipidemia, BPH, vertigo, who presents with weakness.   Patient recently had a colonoscopy, most of diabetic medication was on hold, he has been running high on glucose. Arriving the hospital, patient was diagnosed with DKA with a glucose of 377 bicarb less than 7, anion gap of 18.  He was initially placed on insulin drip, but improved, insulin drip was taken off 3/23.  Then he developed worsening metabolic acidosis with elevated anion gap again in the morning 3/24.  Insulin drip was restarted. Patient also had a new onset atrial fibrillation with RVR, amiodarone drip was started, cardiology consult obtained.   Principal Problem:   DKA, type 2, not at goal [facility]) Active Problems:   Myocardial injury   Tachycardia   Essential hypertension   HLD (hyperlipidemia)   Leukocytosis   Benign prostatic hyperplasia without lower urinary tract symptoms   Paroxysmal atrial fibrillation with rapid ventricular response (HCC)   Hypokalemia   Hypophosphatemia   Assessment and Plan: Uncontrolled type 2 diabetes with DKA. Hypokalemia. Hypomagnesemia. Patient had a worsening metabolic acidosis today with elevated anion gap.  Insulin drip was restarted.  Potassium is 3.5 today, will give IV potassium.  Continue fluids. Also replete phosphorus. We probably will continue insulin drip until tomorrow morning to make sure DKA does not recur.  Paroxysmal atrial fibrillation with RVR. Patient currently treated with amiodarone drip, I started as needed metoprolol IV and scheduled diltiazem 60 mg every 8 hours.  Discussed with cardiology, they will see the patient today. Echocardiogram is pending.  Essential  hypertension. Dyslipidemia Continue diltiazem, patient is also on Zetia and Crestor.  Benign prostate hypertrophy. Continue Flomax.     Subjective:  Patient feels tired, denies significant short of breath or cough.  No abdominal pain or nausea vomiting today.  No diarrhea.  Physical Exam: Vitals:   07/30/22 0400 07/30/22 0500 07/30/22 0600 07/30/22 0700  BP: 120/66 116/69 124/71 114/68  Pulse: 94 98 96 93  Resp: 18 20 18 19   Temp:      TempSrc:      SpO2: 97% 96% 95% 97%  Weight:       General exam: Appears calm and comfortable  Respiratory system: Clear to auscultation. Respiratory effort normal. Cardiovascular system: Irregular. No JVD, murmurs, rubs, gallops or clicks. No pedal edema. Gastrointestinal system: Abdomen is nondistended, soft and nontender. No organomegaly or masses felt. Normal bowel sounds heard. Central nervous system: Alert and oriented. No focal neurological deficits. Extremities: Symmetric 5 x 5 power. Skin: No rashes, lesions or ulcers Psychiatry: Judgement and insight appear normal. Mood & affect appropriate.    Data Reviewed:  Reviewed lab results, chest x-ray.  Family Communication: Wife updated at the bedside.  Disposition: Status is: Inpatient Remains inpatient appropriate because: Severity of disease, IV treatment.  Stepdown monitoring.     Time spent: 50 minutes  Author: Sharen Hones, MD 07/30/2022 11:01 AM  For on call review [email or web address].

## 2022-07-31 ENCOUNTER — Other Ambulatory Visit (HOSPITAL_COMMUNITY): Payer: Self-pay

## 2022-07-31 DIAGNOSIS — E1110 Type 2 diabetes mellitus with ketoacidosis without coma: Secondary | ICD-10-CM | POA: Diagnosis not present

## 2022-07-31 DIAGNOSIS — E876 Hypokalemia: Secondary | ICD-10-CM | POA: Diagnosis not present

## 2022-07-31 DIAGNOSIS — I480 Paroxysmal atrial fibrillation: Secondary | ICD-10-CM | POA: Diagnosis not present

## 2022-07-31 LAB — BASIC METABOLIC PANEL
Anion gap: 10 (ref 5–15)
Anion gap: 5 (ref 5–15)
Anion gap: 6 (ref 5–15)
BUN: 17 mg/dL (ref 8–23)
BUN: 18 mg/dL (ref 8–23)
BUN: 21 mg/dL (ref 8–23)
CO2: 17 mmol/L — ABNORMAL LOW (ref 22–32)
CO2: 21 mmol/L — ABNORMAL LOW (ref 22–32)
CO2: 21 mmol/L — ABNORMAL LOW (ref 22–32)
Calcium: 7.9 mg/dL — ABNORMAL LOW (ref 8.9–10.3)
Calcium: 7.9 mg/dL — ABNORMAL LOW (ref 8.9–10.3)
Calcium: 8.2 mg/dL — ABNORMAL LOW (ref 8.9–10.3)
Chloride: 110 mmol/L (ref 98–111)
Chloride: 113 mmol/L — ABNORMAL HIGH (ref 98–111)
Chloride: 118 mmol/L — ABNORMAL HIGH (ref 98–111)
Creatinine, Ser: 0.64 mg/dL (ref 0.61–1.24)
Creatinine, Ser: 0.66 mg/dL (ref 0.61–1.24)
Creatinine, Ser: 0.69 mg/dL (ref 0.61–1.24)
GFR, Estimated: >60 mL/min (ref >60)
GFR, Estimated: >60 mL/min (ref >60)
GFR, Estimated: >60 mL/min (ref >60)
Glucose, Bld: 151 mg/dL — ABNORMAL HIGH (ref 70–99)
Glucose, Bld: 174 mg/dL — ABNORMAL HIGH (ref 70–99)
Glucose, Bld: 217 mg/dL — ABNORMAL HIGH (ref 70–99)
Potassium: 3.1 mmol/L — ABNORMAL LOW (ref 3.5–5.1)
Potassium: 3.4 mmol/L — ABNORMAL LOW (ref 3.5–5.1)
Potassium: 3.7 mmol/L (ref 3.5–5.1)
Sodium: 137 mmol/L (ref 135–145)
Sodium: 139 mmol/L (ref 135–145)
Sodium: 145 mmol/L (ref 135–145)

## 2022-07-31 LAB — GLUCOSE, CAPILLARY
Glucose-Capillary: 136 mg/dL — ABNORMAL HIGH (ref 70–99)
Glucose-Capillary: 142 mg/dL — ABNORMAL HIGH (ref 70–99)
Glucose-Capillary: 146 mg/dL — ABNORMAL HIGH (ref 70–99)
Glucose-Capillary: 149 mg/dL — ABNORMAL HIGH (ref 70–99)
Glucose-Capillary: 156 mg/dL — ABNORMAL HIGH (ref 70–99)
Glucose-Capillary: 163 mg/dL — ABNORMAL HIGH (ref 70–99)
Glucose-Capillary: 188 mg/dL — ABNORMAL HIGH (ref 70–99)
Glucose-Capillary: 251 mg/dL — ABNORMAL HIGH (ref 70–99)
Glucose-Capillary: 261 mg/dL — ABNORMAL HIGH (ref 70–99)

## 2022-07-31 LAB — MAGNESIUM: Magnesium: 1.9 mg/dL (ref 1.7–2.4)

## 2022-07-31 LAB — HEMOGLOBIN A1C
Hgb A1c MFr Bld: 11.3 % — ABNORMAL HIGH (ref 4.8–5.6)
Mean Plasma Glucose: 278 mg/dL

## 2022-07-31 LAB — BETA-HYDROXYBUTYRIC ACID
Beta-Hydroxybutyric Acid: 0.82 mmol/L — ABNORMAL HIGH (ref 0.05–0.27)
Beta-Hydroxybutyric Acid: 1.95 mmol/L — ABNORMAL HIGH (ref 0.05–0.27)

## 2022-07-31 LAB — PHOSPHORUS: Phosphorus: <1 mg/dL — CL (ref 2.5–4.6)

## 2022-07-31 MED ORDER — SODIUM PHOSPHATES 45 MMOLE/15ML IV SOLN: 30.0000 mmol | Freq: Once | INTRAVENOUS | Status: DC

## 2022-07-31 MED ORDER — POTASSIUM PHOSPHATES 15 MMOLE/5ML IV SOLN
45.0000 mmol | Freq: Once | INTRAVENOUS | Status: AC
  Administered 2022-07-31: 45 mmol via INTRAVENOUS
  Filled 2022-07-31: qty 15

## 2022-07-31 MED ORDER — ALUM & MAG HYDROXIDE-SIMETH 200-200-20 MG/5ML PO SUSP
30.0000 mL | Freq: Three times a day (TID) | ORAL | Status: DC | PRN
  Administered 2022-07-31 – 2022-08-01 (×2): 30 mL via ORAL
  Filled 2022-07-31: qty 30

## 2022-07-31 MED ORDER — INSULIN ASPART 100 UNIT/ML IJ SOLN
0.0000 [IU] | Freq: Three times a day (TID) | INTRAMUSCULAR | Status: DC
  Administered 2022-07-31: 5 [IU] via SUBCUTANEOUS
  Administered 2022-07-31: 2 [IU] via SUBCUTANEOUS
  Administered 2022-08-01 (×2): 3 [IU] via SUBCUTANEOUS
  Administered 2022-08-01 – 2022-08-02 (×2): 2 [IU] via SUBCUTANEOUS
  Filled 2022-07-31 (×6): qty 1

## 2022-07-31 MED ORDER — POTASSIUM CHLORIDE CRYS ER 20 MEQ PO TBCR
40.0000 meq | EXTENDED_RELEASE_TABLET | Freq: Two times a day (BID) | ORAL | Status: DC
  Administered 2022-07-31: 40 meq via ORAL
  Filled 2022-07-31: qty 2

## 2022-07-31 MED ORDER — POTASSIUM & SODIUM PHOSPHATES 280-160-250 MG PO PACK
2.0000 | PACK | Freq: Three times a day (TID) | ORAL | Status: AC
  Administered 2022-07-31 – 2022-08-01 (×4): 2 via ORAL
  Filled 2022-07-31 (×4): qty 2

## 2022-07-31 MED ORDER — PANTOPRAZOLE SODIUM 40 MG PO TBEC
40.0000 mg | DELAYED_RELEASE_TABLET | Freq: Every day | ORAL | Status: DC
  Administered 2022-07-31 – 2022-08-01 (×2): 40 mg via ORAL
  Filled 2022-07-31 (×2): qty 1

## 2022-07-31 MED ORDER — CALCIUM CARBONATE ANTACID 500 MG PO CHEW
400.0000 mg | CHEWABLE_TABLET | Freq: Three times a day (TID) | ORAL | Status: DC | PRN
  Administered 2022-07-31 – 2022-08-01 (×5): 400 mg via ORAL
  Filled 2022-07-31 (×5): qty 2

## 2022-07-31 MED ORDER — ALUM & MAG HYDROXIDE-SIMETH 200-200-20 MG/5ML PO SUSP
30.0000 mL | Freq: Three times a day (TID) | ORAL | Status: DC
  Filled 2022-07-31: qty 30

## 2022-07-31 MED ORDER — POTASSIUM CHLORIDE CRYS ER 20 MEQ PO TBCR
40.0000 meq | EXTENDED_RELEASE_TABLET | ORAL | Status: AC
  Administered 2022-07-31 (×2): 40 meq via ORAL
  Filled 2022-07-31 (×2): qty 2

## 2022-07-31 MED ORDER — INSULIN ASPART 100 UNIT/ML IJ SOLN
0.0000 [IU] | Freq: Every day | INTRAMUSCULAR | Status: DC
  Administered 2022-07-31: 3 [IU] via SUBCUTANEOUS
  Filled 2022-07-31: qty 1

## 2022-07-31 MED ORDER — INSULIN ASPART 100 UNIT/ML IJ SOLN
3.0000 [IU] | Freq: Three times a day (TID) | INTRAMUSCULAR | Status: DC
  Administered 2022-08-01 – 2022-08-02 (×5): 3 [IU] via SUBCUTANEOUS
  Filled 2022-07-31 (×4): qty 1

## 2022-07-31 MED ORDER — POTASSIUM CHLORIDE 10 MEQ/100ML IV SOLN
10.0000 meq | INTRAVENOUS | Status: AC
  Administered 2022-07-31 (×2): 10 meq via INTRAVENOUS
  Filled 2022-07-31 (×2): qty 100

## 2022-07-31 MED ORDER — INSULIN GLARGINE-YFGN 100 UNIT/ML ~~LOC~~ SOLN
20.0000 [IU] | SUBCUTANEOUS | Status: DC
  Administered 2022-08-01 – 2022-08-02 (×2): 20 [IU] via SUBCUTANEOUS
  Filled 2022-07-31 (×2): qty 0.2

## 2022-07-31 MED ORDER — POTASSIUM CHLORIDE 10 MEQ/100ML IV SOLN: 10.0000 meq | INTRAVENOUS | Status: DC

## 2022-07-31 MED ORDER — INSULIN GLARGINE-YFGN 100 UNIT/ML ~~LOC~~ SOLN
16.0000 [IU] | SUBCUTANEOUS | Status: DC
  Administered 2022-07-31: 16 [IU] via SUBCUTANEOUS
  Filled 2022-07-31: qty 0.16

## 2022-07-31 NOTE — Progress Notes (Signed)
  Progress Note   Patient: Michael Gross [id number] DOB: [birth year] DOA: 07/28/2022     3 DOS: the patient was seen and examined on 07/31/2022   Brief hospital course: Michael Gross is a 67 y.o. male with medical history significant of diabetes mellitus, hypertension, hyperlipidemia, BPH, vertigo, who presents with weakness.   Patient recently had a colonoscopy, most of diabetic medication was on hold, he has been running high on glucose. Arriving the hospital, patient was diagnosed with DKA with a glucose of 377 bicarb less than 7, anion gap of 18.  He was initially placed on insulin drip, but improved, insulin drip was taken off 3/23.  Then he developed worsening metabolic acidosis with elevated anion gap again in the morning 3/24.  Insulin drip was restarted. Patient also had a new onset atrial fibrillation with RVR, amiodarone drip was started, cardiology consult obtained. Condition improved, transition to subcu insulin on 3/25.   Principal Problem:   Diabetic ketoacidosis without coma associated with type 2 diabetes mellitus ([place]) Active Problems:   Myocardial injury   Tachycardia   Essential hypertension   HLD (hyperlipidemia)   Leukocytosis   Benign prostatic hyperplasia without lower urinary tract symptoms   Paroxysmal atrial fibrillation (HCC)   Hypokalemia   Hypophosphatemia   Assessment and Plan: Uncontrolled type 2 diabetes with DKA. Hypokalemia. Hypophosphatemia. Patient condition has improved, transition to subcu insulin injection. Replete potassium, monitor levels.   Paroxysmal atrial fibrillation with RVR. Echocardiogram showed ejection fraction 60 to 65%, with grade 1 diastolic dysfunction. Currently patient does not have volume overload. Continue amiodarone per cardiology.   Essential hypertension. Dyslipidemia Continue diltiazem, patient is also on Zetia and Crestor.   Benign prostate hypertrophy. Continue Flomax.        Subjective:   Patient much improved today, currently does not have any complaints.  Physical Exam: Vitals:   07/31/22 0700 07/31/22 0800 07/31/22 0900 07/31/22 1000  BP: 110/62 100/60 119/61 99/60  Pulse: 82 78 83 86  Resp: 18 16 16 13   Temp:  98 F (36.7 C)    TempSrc:  Oral    SpO2: 96% 95% 95% 96%  Weight:       General exam: Appears calm and comfortable  Respiratory system: Clear to auscultation. Respiratory effort normal. Cardiovascular system: S1 & S2 heard, RRR. No JVD, murmurs, rubs, gallops or clicks. No pedal edema. Gastrointestinal system: Abdomen is nondistended, soft and nontender. No organomegaly or masses felt. Normal bowel sounds heard. Central nervous system: Alert and oriented. No focal neurological deficits. Extremities: Symmetric 5 x 5 power. Skin: No rashes, lesions or ulcers Psychiatry: Judgement and insight appear normal. Mood & affect appropriate.    Data Reviewed:  Lab results reviewed.  Family Communication: Wife updated at bedside.  Disposition: Status is: Inpatient Remains inpatient appropriate because: Severity of disease.     Time spent: 35 minutes  Author: Sharen Hones, MD 07/31/2022 11:51 AM  For on call review [email or web address].

## 2022-07-31 NOTE — Progress Notes (Signed)
Rounding Note    Patient Name: Michael Gross Date of Encounter: 07/31/2022  [place] Cardiologist: Ida Rogue, MD   Subjective   Patient seen on AM rounds.  Denies any chest pain or shortness of breath.  Family remains at the bedside.  Patient converted to normal sinus rhythm with heart rate in the 90s yesterday evening.  Continues to complain of fatigue and overall just not feeling well.  Serum potassium this morning down to 3.1.  Inpatient Medications    Scheduled Meds:  aspirin EC  81 mg Oral Daily   Chlorhexidine Gluconate Cloth  6 each Topical Q0600   diltiazem  60 mg Oral Q8H   enoxaparin (LOVENOX) injection  40 mg Subcutaneous Q24H   ezetimibe  10 mg Oral Daily   insulin aspart  0-5 Units Subcutaneous QHS   insulin aspart  0-9 Units Subcutaneous TID WC   insulin glargine-yfgn  16 Units Subcutaneous Q24H   pantoprazole (PROTONIX) IV  40 mg Intravenous Q24H   potassium chloride  40 mEq Oral Q2H   pregabalin  25 mg Oral BID   rosuvastatin  40 mg Oral Daily   tamsulosin  0.8 mg Oral Daily   vitamin B-12  100 mcg Oral Daily   Continuous Infusions:  sodium chloride 5 mL/hr at 07/31/22 0700   amiodarone 60 mg/hr (07/31/22 0700)   dextrose 5% lactated ringers with KCl 20 mEq/L 125 mL/hr at 07/31/22 0700   insulin 3.2 Units/hr (07/31/22 0700)   potassium chloride     PRN Meds: sodium chloride, acetaminophen, dextrose, hydrALAZINE, metoprolol tartrate, ondansetron (ZOFRAN) IV   Vital Signs    Vitals:   07/31/22 0500 07/31/22 0600 07/31/22 0601 07/31/22 0700  BP: 129/67 117/62 117/62 110/62  Pulse: 85 80 81 82  Resp: (!) 22 15 16 18   Temp:      TempSrc:      SpO2: 97% 98% 96% 96%  Weight:        Intake/Output Summary (Last 24 hours) at 07/31/2022 0854 Last data filed at 07/31/2022 0700 Gross per 24 hour  Intake 5187.48 ml  Output 3800 ml  Net 1387.48 ml      07/28/2022    5:55 PM 07/27/2022    8:31 AM 07/19/2022   11:12 AM  Last 3  Weights  Weight (lbs) 189 lb 6 oz 180 lb 14.4 oz 193 lb  Weight (kg) 85.9 kg 82.056 kg 87.544 kg      Telemetry     Sinus to paroxysmal atrial fibrillation rates in the 90's- Personally Reviewed  ECG    No new tracings - Personally Reviewed  Physical Exam   GEN: No acute distress.   Neck: No JVD Cardiac: RRR, no murmurs, rubs, or gallops.  Respiratory: Coarse to auscultation bilaterally. Respirations are unlabored on room air GI: Soft, nontender, non-distended  MS: No edema; No deformity. Neuro:  Nonfocal  Psych: Normal affect   Labs    High Sensitivity Troponin:   Recent Labs  Lab 07/28/22 1409 07/28/22 1633 07/28/22 1839 07/28/22 1938 07/28/22 2242  TROPONINIHS 26* 24* 38* 35* 47*     Chemistry Recent Labs  Lab 07/28/22 1409 07/28/22 1633 07/28/22 2245 07/29/22 0336 07/29/22 0950 07/30/22 0413 07/30/22 0839 07/30/22 1944 07/30/22 2342 07/31/22 0718  NA 136   < >  --    < > 144 145   < > 139 145 139  K 4.0   < >  --    < > 3.0*  3.5   < > 3.3* 3.4* 3.1*  CL 109   < >  --    < > 119* 119*   < > 117* 118* 113*  CO2 <7*   < >  --    < > 13* 8*   < > 15* 17* 21*  GLUCOSE 377*   < >  --    < > 203* 226*   < > 183* 174* 151*  BUN 23   < >  --    < > 20 38*   < > 26* 21 18  CREATININE 1.19   < >  --    < > 0.88 0.95   < > 0.75 0.69 0.64  CALCIUM 9.3   < >  --    < > 8.2* 8.3*   < > 7.9* 8.2* 7.9*  MG 2.6*  --  2.4  --  2.2 2.3  --   --   --   --   PROT 6.8  --   --   --  5.3* 5.1*  --   --   --   --   ALBUMIN 3.7  --   --   --  2.7* 2.5*  --   --   --   --   AST 17  --   --   --  15 12*  --   --   --   --   ALT 27  --   --   --  19 17  --   --   --   --   ALKPHOS 80  --   --   --  52 46  --   --   --   --   BILITOT 1.9*  --   --   --  1.9* 2.3*  --   --   --   --   GFRNONAA >60   < >  --    < > >60 >60   < > >60 >60 >60  ANIONGAP NOT CALCULATED   < >  --    < > 12 18*   < > 7 10 5    < > = values in this interval not displayed.    Lipids  Recent Labs  Lab  07/29/22 0336  CHOL 83  TRIG 77  HDL 43  LDLCALC 25  CHOLHDL 1.9    Hematology Recent Labs  Lab 07/28/22 1409 07/29/22 0336 07/30/22 0413  WBC 14.2* 9.4 8.0  RBC 5.39 4.68 3.94*  HGB 16.2 14.3 12.1*  HCT 50.4 42.1 36.0*  MCV 93.5 90.0 91.4  MCH 30.1 30.6 30.7  MCHC 32.1 34.0 33.6  RDW 13.5 13.7 14.0  PLT 163 100* 132*   Thyroid  Recent Labs  Lab 07/28/22 1409  TSH 0.320*    BNPNo results for input(s): "BNP", "PROBNP" in the last 168 hours.  DDimer No results for input(s): "DDIMER" in the last 168 hours.   Radiology      Cardiac Studies  TTE 07/30/22  1. Left ventricular ejection fraction, by estimation, is 60 to 65%. The  left ventricle has normal function. The left ventricle has no regional  wall motion abnormalities. Left ventricular diastolic parameters are  consistent with Grade I diastolic  dysfunction (impaired relaxation).   2. Right ventricular systolic function is normal. The right ventricular  size is normal. There is normal pulmonary artery systolic pressure. The  estimated right ventricular systolic pressure is [id number] mmHg.  3. The mitral valve is normal in structure. No evidence of mitral valve  regurgitation. No evidence of mitral stenosis.   4. The aortic valve has an indeterminant number of cusps. There is mild  calcification of the aortic valve. Aortic valve regurgitation is not  visualized. Aortic valve sclerosis is present, with no evidence of aortic  valve stenosis.   5. The inferior vena cava is normal in size with greater than 50%  respiratory variability, suggesting right atrial pressure of 3 mmHg.   Patient Profile     67 y.o. male with a history of diabetes, hyperlipidemia, BPH was seen and seen and evaluated for paroxysmal atrial fibrillation with RVR in the setting of DKA.  Assessment & Plan    Paroxysmal atrial fibrillation -Darted back to sinus rhythm at last evening with rates in the 90's, noted atrial fibrillation on  telemetry monitoring overnight the remained rate controlled -Continued on amiodarone infusion at 1 mg/min -Patient remains in sinus rhythm can transition to oral amiodarone as early as tomorrow if he remains in sinus rhythm and electrolyte derangement has improved -Continue with telemetry monitoring -Continue on diltiazem 60 mg every 8 hours -CHA2DS2-VASc score of 3 currently not on anticoagulation given short duration of atrial fibrillation and clear trigger, anticoagulation is on hold for the time being  Elevated high-sensitivity troponins -Minimally elevated troponin in the setting of DKA and tachycardia -Likely demand ischemia -Echocardiogram revealed normal LVEF 60 to 65%, with no regional wall motion abnormalities -Currently remains chest pain-free -EKG for pain or changes -No plans for immediate ischemic workup at this time  DKA -Continued on IV fluids and insulin -Management per IM  Dyslipidemia -Continued on Zetia and rosuvastatin -LDL 25  Hypertension -Blood pressure 99/60 -Remains soft at times -Vital signs per unit protocol  Electrolyte derangement -Hypokalemia and hyperchloremic in the setting of DKA -Management per IM     CHA2DS2-VASc Score = 3   This indicates a 3.2% annual risk of stroke. The patient's score is based upon: CHF History: 0 HTN History: 1 Diabetes History: 1 Stroke History: 0 Vascular Disease History: 0 Age Score: 1 Gender Score: 0     For questions or updates, please contact [place] Please consult [email or web address] for contact info under        Signed, Princella Jaskiewicz, NP  07/31/2022, 8:54 AM

## 2022-07-31 NOTE — Inpatient Diabetes Management (Addendum)
Inpatient Diabetes Program Recommendations  AACE/ADA: New Consensus Statement on Inpatient Glycemic Control   Target Ranges:  Prepandial:   less than 140 mg/dL      Peak postprandial:   less than 180 mg/dL (1-2 hours)      Critically ill patients:  140 - 180 mg/dL    Latest Reference Range & Units 07/30/22 17:21 07/30/22 18:03 07/30/22 19:03 07/30/22 21:02 07/30/22 22:58 07/31/22 00:56 07/31/22 03:02 07/31/22 05:00 07/31/22 06:56  Glucose-Capillary 70 - 99 mg/dL 164 (H) 168 (H) 141 (H) 152 (H) 156 (H) 142 (H) 149 (H) 156 (H) 146 (H)    Latest Reference Range & Units 07/31/22 07:18  CO2 22 - 32 mmol/L 21 (L)  Glucose 70 - 99 mg/dL 151 (H)  BUN 8 - 23 mg/dL 18  Creatinine 0.61 - 1.24 mg/dL 0.64  Calcium 8.9 - 10.3 mg/dL 7.9 (L)  Anion gap 5 - 15  5    Latest Reference Range & Units 07/28/22 14:09  CO2 22 - 32 mmol/L <7 (L)  Glucose 70 - 99 mg/dL 377 (H)  Anion gap 5 - 15  NOT CALCULATED    Latest Reference Range & Units 07/28/22 19:38 07/30/22 08:39 07/30/22 15:54 07/30/22 23:42  Beta-Hydroxybutyric Acid 0.05 - 0.27 mmol/L 7.45 (H) 7.65 (H) 4.07 (H) 1.95 (H)   Review of Glycemic Control  Diabetes history: DM2 Outpatient Diabetes medications: Jardiance 25 mg daily, Amaryl 4 mg BID, Ozempic 0.5 mg Qweek, Soliqua 100-33 unit-mcg/ml 32 units QAM (not taking; stopped 04/13/2022) Current orders for Inpatient glycemic control: IV insulin  Inpatient Diabetes Program Recommendations:    Insulin: IV insulin should be continued until acidosis has completely resolved. Once acidosis cleared and provider ready to transition from IV to SQ insulin, please consider ordering Semglee 26 units Q24H (based on 85.9 kg x 0.3 units), CBGs AC&HS, Novolog 0-15 TID with meals, Novolog 0-5 units QHS.   IV Fluids: Please re-evaluate dextrose in IV fluids when patient is transitioned from IV to SQ insulin.  Outpatient DM medications: Patient reported that he was told to discontinue [place] about 1 month ago.  Would not resume Soliqua since it is insulin glargine and GLP-1 combo medication and patient is already taking Ozempic (GLP-1). Would recommend to discharge patient on basal insulin and discontinue Jardiance due to risk of DKA.  Addendum 07/31/22@13 :00-Spoke with patient and wife at bedside regarding DM.  Patient reports being followed by PCP for diabetes management and currently taking Jardiance 25 mg daily, Ozempic 0.5 mg Qweek, and Amaryl 4 mg BID as an outpatient for diabetes control. Patient reports taking DM medications as prescribed. Patient states that he has been on [place] in the past but not sure how much he was taking; he thinks it was about 1 month ago when he stopped the [place] as directed by PCP.  Patient reports that he does not check glucose very often, usually 2-3 times a week and they are generally under 200 mg/dl. Patient notes that he had a colonoscopy this past Thursday on 07/27/22 and he was told to stop all medications 3-5 days (some 3 days and some 5 days) prior to the colonoscopy. Patient reports that he had stopped taking the medications as directed and that he had the colonoscopy done on Thursday. Patient's wife states that he resumed the Ozempic on Thursday and the rest of his DM medications on Friday morning. Patient's wife states that patient was feeling very bad on Friday morning but still took DM medications and glucose was  over 500 mg/dl and came down to 460 mg/dl and she ended up calling EMS because patient was "out of it and so sick with high sugars".  Patient reports that he is not certain when DM medications were last changed but thinks it was about 1 month ago. Patient reports he has never been in DKA. Discussed what DKA is and how it is treated in the hospital setting with IV insulin.  Discussed A1C results (10.8% from 07/13/22) and explained that current A1C indicates an average glucose of 263 mg/dl over the past 2-3 months. Discussed glucose and A1C goals. Discussed  importance of checking CBGs and maintaining good CBG control to prevent long-term and short-term complications. Patient feels that he went into DKA because he was told to stop all his DM medications for 3-5 days prior to the colonoscopy. Patient is not sure if he still has [place] at home or not. Informed patient that he will likely need to resume taking insulin outpatient and patient is agreeable to resume insulin. Encouraged patient to get established with an Endocrinologist to assist with DM management. Patient's wife states that she goes to [facility] and she plans to see if she can get patient established there.  Inquired about any knowledge of continuous glucose monitoring sensors (CGM) and patient states he does not know much about them. Discussed CGMs and how they work, the information it could provide to him and his providers to get DM under better control, and also how sensor alarms can help notify him of hypoglycemia and hyperglycemia. Patient states he would be interested in using a CGM if covered by his insurance. Informed patient that I would check to see if insurance would cover CGM and if so I would ask attending provider for permission to provide CGM sensor sample.   Patient verbalized understanding of information discussed and reports no further questions at this time related to diabetes. Noted patient seen PCP on 07/13/22 and home medications listed as Amaryl 4 mg BID, Ozempic 0.5 mg Qweek, and Jardiance 25 mg daily. Called patient's PCP office to inquire about prior Soliqua and DM medication changes. Per nurse at PCP office, in December 2023, Soliqua 32 units QAM was stopped, Jardiance 25 mg daily and Ozempic 0.5 mg Qweek was added at that time.   Thanks, Barnie Alderman, RN, MSN, [place] Diabetes Coordinator Inpatient Diabetes Program [phone] (Team Pager from 8am to [place])

## 2022-07-31 NOTE — Progress Notes (Signed)
Patient alert and oriented x4, stable pressures, on room air.  Patient c/o of heartburn when eating. MD made aware, medications given. Patient transferred to/from BSC/chair/bed. Tolerated well, with one assist. Education provided to patient and family. Critical value for phos called, MD made aware. New orders provided.

## 2022-07-31 NOTE — TOC Benefit Eligibility Note (Signed)
Patient Teacher, English as a foreign language completed.    The patient is currently admitted and upon discharge could be taking Lantus.  The current 30 day co-pay is $35.00.      Patient Teacher, English as a foreign language completed.    The patient is currently admitted and upon discharge could be taking Basaglar.  The current 30 day co-pay is $35.00.     Patient Teacher, English as a foreign language completed.    The patient is currently admitted and upon discharge could be taking Toujeo.  The current 30 day co-pay is $35.00.     Patient Teacher, English as a foreign language completed.    The patient is currently admitted and upon discharge could be taking Semglee.  The current 30 day co-pay is $35.00.       Patient Teacher, English as a foreign language completed.    The patient is currently admitted and upon discharge could be taking Descom G7.  The current 30 day co-pay is $0.00.      Patient Teacher, English as a foreign language completed.    The patient is currently admitted and upon discharge could be taking Freestyle Libre 3.  The current 30 day co-pay is $0.00.   The patient is insured through [facility] Part D   This test claim was processed through [facility] amounts may vary at other pharmacies due to pharmacy/plan contracts, or as the patient moves through the different stages of their insurance plan.

## 2022-08-01 ENCOUNTER — Encounter: Payer: Self-pay | Admitting: Internal Medicine

## 2022-08-01 DIAGNOSIS — I480 Paroxysmal atrial fibrillation: Secondary | ICD-10-CM | POA: Diagnosis not present

## 2022-08-01 DIAGNOSIS — E1110 Type 2 diabetes mellitus with ketoacidosis without coma: Secondary | ICD-10-CM | POA: Diagnosis not present

## 2022-08-01 DIAGNOSIS — K219 Gastro-esophageal reflux disease without esophagitis: Secondary | ICD-10-CM | POA: Insufficient documentation

## 2022-08-01 LAB — BASIC METABOLIC PANEL
Anion gap: 8 (ref 5–15)
BUN: 13 mg/dL (ref 8–23)
CO2: 22 mmol/L (ref 22–32)
Calcium: 7.8 mg/dL — ABNORMAL LOW (ref 8.9–10.3)
Chloride: 105 mmol/L (ref 98–111)
Creatinine, Ser: 0.72 mg/dL (ref 0.61–1.24)
GFR, Estimated: >60 mL/min (ref >60)
Glucose, Bld: 224 mg/dL — ABNORMAL HIGH (ref 70–99)
Potassium: 3.8 mmol/L (ref 3.5–5.1)
Sodium: 135 mmol/L (ref 135–145)

## 2022-08-01 LAB — GLUCOSE, CAPILLARY
Glucose-Capillary: 196 mg/dL — ABNORMAL HIGH (ref 70–99)
Glucose-Capillary: 212 mg/dL — ABNORMAL HIGH (ref 70–99)
Glucose-Capillary: 170 mg/dL — ABNORMAL HIGH (ref 70–99)
Glucose-Capillary: 172 mg/dL — ABNORMAL HIGH (ref 70–99)

## 2022-08-01 LAB — MAGNESIUM: Magnesium: 1.9 mg/dL (ref 1.7–2.4)

## 2022-08-01 LAB — PHOSPHORUS: Phosphorus: 3.1 mg/dL (ref 2.5–4.6)

## 2022-08-01 MED ORDER — PANTOPRAZOLE SODIUM 40 MG PO TBEC
40.0000 mg | DELAYED_RELEASE_TABLET | Freq: Two times a day (BID) | ORAL | Status: DC
  Administered 2022-08-01 – 2022-08-02 (×2): 40 mg via ORAL
  Filled 2022-08-01 (×2): qty 1

## 2022-08-01 MED ORDER — ORAL CARE MOUTH RINSE: 15.0000 mL | OROMUCOSAL | Status: DC | PRN

## 2022-08-01 MED ORDER — AMIODARONE HCL 200 MG PO TABS
200.0000 mg | ORAL_TABLET | Freq: Two times a day (BID) | ORAL | Status: DC
  Administered 2022-08-01 – 2022-08-02 (×3): 200 mg via ORAL
  Filled 2022-08-01 (×3): qty 1

## 2022-08-01 MED ORDER — DILTIAZEM HCL ER COATED BEADS 120 MG PO CP24
120.0000 mg | ORAL_CAPSULE | Freq: Every day | ORAL | Status: DC
  Administered 2022-08-01 – 2022-08-02 (×2): 120 mg via ORAL
  Filled 2022-08-01 (×2): qty 1

## 2022-08-01 NOTE — Progress Notes (Signed)
Rounding Note    Patient Name: Michael Gross Date of Encounter: 08/01/2022  [place] Cardiologist: Ida Rogue, MD   Subjective   Patient seen on AM rounds. Sitting upright in bed. Denies any chest pain or shortness of breath. Endorses acid reflux which he states is new for him.  Patient stating that he is feeling more like his self today than prior.  Inpatient Medications    Scheduled Meds:  aspirin EC  81 mg Oral Daily   Chlorhexidine Gluconate Cloth  6 each Topical Q0600   diltiazem  60 mg Oral Q8H   enoxaparin (LOVENOX) injection  40 mg Subcutaneous Q24H   ezetimibe  10 mg Oral Daily   insulin aspart  0-5 Units Subcutaneous QHS   insulin aspart  0-9 Units Subcutaneous TID WC   insulin aspart  3 Units Subcutaneous TID WC   insulin glargine-yfgn  20 Units Subcutaneous Q24H   pantoprazole  40 mg Oral Daily   potassium & sodium phosphates  2 packet Oral TID AC & HS   pregabalin  25 mg Oral BID   rosuvastatin  40 mg Oral Daily   tamsulosin  0.8 mg Oral Daily   vitamin B-12  100 mcg Oral Daily   Continuous Infusions:  sodium chloride 5 mL/hr at 08/01/22 0600   amiodarone 30 mg/hr (08/01/22 0600)   PRN Meds: sodium chloride, acetaminophen, alum & mag hydroxide-simeth, calcium carbonate, dextrose, hydrALAZINE, metoprolol tartrate, ondansetron (ZOFRAN) IV   Vital Signs    Vitals:   08/01/22 0300 08/01/22 0400 08/01/22 0500 08/01/22 0600  BP: 113/67 109/68 120/66 (!) 105/59  Pulse: 81 78 78 81  Resp: 15 17 15 19   Temp:      TempSrc:      SpO2: 95% 94% 97% 95%  Weight:        Intake/Output Summary (Last 24 hours) at 08/01/2022 1127 Last data filed at 08/01/2022 0600 Gross per 24 hour  Intake 1688.39 ml  Output 3550 ml  Net -1861.61 ml      07/28/2022    5:55 PM 07/27/2022    8:31 AM 07/19/2022   11:12 AM  Last 3 Weights  Weight (lbs) 189 lb 6 oz 180 lb 14.4 oz 193 lb  Weight (kg) 85.9 kg 82.056 kg 87.544 kg      Telemetry    Sinus  rhythm rate of 70-80- Personally Reviewed  ECG    No new tracings- Personally Reviewed  Physical Exam   GEN: No acute distress.   Neck: No JVD Cardiac: RRR, no murmurs, rubs, or gallops.  Respiratory: Clear upper lobes with diminished bases to auscultation bilaterally.  Respirations remain unlabored on room air. GI: Soft, nontender, non-distended  MS: No edema; No deformity. Neuro:  Nonfocal  Psych: Normal affect   Labs    High Sensitivity Troponin:   Recent Labs  Lab 07/28/22 1409 07/28/22 1633 07/28/22 1839 07/28/22 1938 07/28/22 2242  TROPONINIHS 26* 24* 38* 35* 47*     Chemistry Recent Labs  Lab 07/28/22 1409 07/28/22 1633 07/29/22 0950 07/30/22 0413 07/30/22 0839 07/31/22 0718 07/31/22 1205 08/01/22 0723  NA 136   < > 144 145   < > 139 137 135  K 4.0   < > 3.0* 3.5   < > 3.1* 3.7 3.8  CL 109   < > 119* 119*   < > 113* 110 105  CO2 <7*   < > 13* 8*   < > 21* 21* 22  GLUCOSE 377*   < > 203* 226*   < > 151* 217* 224*  BUN 23   < > 20 38*   < > 18 17 13   CREATININE 1.19   < > 0.88 0.95   < > 0.64 0.66 0.72  CALCIUM 9.3   < > 8.2* 8.3*   < > 7.9* 7.9* 7.8*  MG 2.6*   < > 2.2 2.3  --   --  1.9 1.9  PROT 6.8  --  5.3* 5.1*  --   --   --   --   ALBUMIN 3.7  --  2.7* 2.5*  --   --   --   --   AST 17  --  15 12*  --   --   --   --   ALT 27  --  19 17  --   --   --   --   ALKPHOS 80  --  52 46  --   --   --   --   BILITOT 1.9*  --  1.9* 2.3*  --   --   --   --   GFRNONAA >60   < > >60 >60   < > >60 >60 >60  ANIONGAP NOT CALCULATED   < > 12 18*   < > 5 6 8    < > = values in this interval not displayed.    Lipids  Recent Labs  Lab 07/29/22 0336  CHOL 83  TRIG 77  HDL 43  LDLCALC 25  CHOLHDL 1.9    Hematology Recent Labs  Lab 07/28/22 1409 07/29/22 0336 07/30/22 0413  WBC 14.2* 9.4 8.0  RBC 5.39 4.68 3.94*  HGB 16.2 14.3 12.1*  HCT 50.4 42.1 36.0*  MCV 93.5 90.0 91.4  MCH 30.1 30.6 30.7  MCHC 32.1 34.0 33.6  RDW 13.5 13.7 14.0  PLT 163 100*  132*   Thyroid  Recent Labs  Lab 07/28/22 1409  TSH 0.320*    BNPNo results for input(s): "BNP", "PROBNP" in the last 168 hours.  DDimer No results for input(s): "DDIMER" in the last 168 hours.   Radiology    No results found.  Cardiac Studies   TTE 07/30/22  1. Left ventricular ejection fraction, by estimation, is 60 to 65%. The  left ventricle has normal function. The left ventricle has no regional  wall motion abnormalities. Left ventricular diastolic parameters are  consistent with Grade I diastolic  dysfunction (impaired relaxation).   2. Right ventricular systolic function is normal. The right ventricular  size is normal. There is normal pulmonary artery systolic pressure. The  estimated right ventricular systolic pressure is [id number] mmHg.   3. The mitral valve is normal in structure. No evidence of mitral valve  regurgitation. No evidence of mitral stenosis.   4. The aortic valve has an indeterminant number of cusps. There is mild  calcification of the aortic valve. Aortic valve regurgitation is not  visualized. Aortic valve sclerosis is present, with no evidence of aortic  valve stenosis.   5. The inferior vena cava is normal in size with greater than 50%  respiratory variability, suggesting right atrial pressure of 3 mmHg.   Patient Profile     67 y.o. male with history of diabetes, hyperlipidemia, BPH was seen and evaluated for paroxysmal atrial fibrillation with RVR in the setting of DKA.  Assessment & Plan    Paroxysmal atrial fibrillation -Continues to remain in sinus rhythm -Continuous telemetry  monitoring -Continued on diltiazem 60 mg every 8 hours -CHA2DS2-VASc score of 3 currently not on anticoagulation given short duration of atrial fibrillation with clear trigger, if he develops recurrent atrial fibrillation-will require long-term anticoagulation be given at that time -Amiodarone drip discontinued with transition of oral amiodarone 200 mg twice daily    Elevated high-sensitivity troponins -Minimally elevated troponin in the setting of DKA and tachycardia -Likely demand ischemia -Echocardiogram revealed normal LVEF 60 to 65%, with no regional wall motion abnormalities -Currently remains chest pain-free -Continue with telemetry monitoring -EKG for pain or changes -No plans for immediate ischemic workup at this time  DKA -Was continued on sliding scale insulin -Management per IM  Dyslipidemia -Continued on rosuvastatin and ezetimibe  Essential hypertension -.  Blood pressure 105/59 -Continued on current medication regimen -Vital signs per unit protocol  Electrolyte derangement -Hypokalemia: In the setting of DKA -Monitor/trend/replete electrolytes as needed -Management per IM     For questions or updates, please contact [place] Please consult [email or web address] for contact info under        Signed, Arrin Ishler, NP  08/01/2022, 11:27 AM

## 2022-08-01 NOTE — Progress Notes (Signed)
Pt transferred to room 234

## 2022-08-01 NOTE — Plan of Care (Signed)
Progressed towards plan of care. Transferred out of ICU.

## 2022-08-01 NOTE — Inpatient Diabetes Management (Addendum)
Inpatient Diabetes Program Recommendations  AACE/ADA: New Consensus Statement on Inpatient Glycemic Control   Target Ranges:  Prepandial:   less than 140 mg/dL      Peak postprandial:   less than 180 mg/dL (1-2 hours)      Critically ill patients:  140 - 180 mg/dL    Latest Reference Range & Units 07/31/22 06:56 07/31/22 08:57 07/31/22 10:13 07/31/22 11:26 07/31/22 16:49 07/31/22 21:32 08/01/22 07:33  Glucose-Capillary 70 - 99 mg/dL 146 (H) 136 (H) 163 (H) 188 (H) 251 (H) 261 (H) 196 (H)    Latest Reference Range & Units 07/28/22 14:09  Hemoglobin A1C 4.8 - 5.6 % 11.3 (H)   Review of Glycemic Control  Diabetes history: DM2 Outpatient Diabetes medications: Jardiance 25 mg daily, Amaryl 4 mg BID, Ozempic 0.5 mg Qweek, Soliqua 100-33 unit-mcg/ml 32 units QAM (not taking; stopped 04/13/2022) Current orders for Inpatient glycemic control: Semglee 20 units Q24H, Novolog 3 units TID with meals, Novolog 0-9 units TID with meals, Novolog 0-5 units QHS  Inpatient Diabetes Program Recommendations:    Insulin: Patient received Semglee 16 units at 9:00 am when transition off IV insulin. Noted Semglee increased to 20 units Q24H and Novolog meal coverage added yesterday evening to start today.   Outpatient DM medications:  Patient reported that he was told to discontinue [place] about 1 month ago. Would not resume Soliqua since it is insulin glargine and GLP-1 combo medication and patient is already taking Ozempic (GLP-1). Would recommend to discharge patient on basal insulin (Lantus SoloStar [phone]) and discontinue Jardiance due to risk of DKA.   HbgA1C:  A1C 11.3% on 07/28/22 indicating an average glucose of 278 mg/dl over the past 2-3 months.  NOTE: Had TOC outpt pharmacy check to see which basal insulins are covered by insurance; Lantus SoloStar [phone]) preferred (co-pay $35). Patient interested in using continuous glucose monitoring (CGM) sensor.    Addendum 08/01/22@10 :55-Spoke to patient's wife  over the phone. She reports that patient will not be discharging today but perhaps tomorrow, they are waiting to see Cardiology.  Informed her that Dr. Roosevelt Locks gave permission to give patient sample of FreeStyle Libre3 CGM. Asked that patient download the FreeStyle Libre3 app on his phone and that diabetes coordinator will plan to come in the morning (08/02/22) to provide samples and provide education on it.   Thanks, Barnie Alderman, RN, MSN, [place] Diabetes Coordinator Inpatient Diabetes Program [phone] (Team Pager from 8am to [place])

## 2022-08-01 NOTE — Progress Notes (Signed)
Rounding Note    Patient Name: Michael Gross Date of Encounter: 08/01/2022  [place] Cardiologist: Ida Rogue, MD   Subjective   He continues to maintain in sinus rhythm.  No chest pain or shortness of breath but he does complain of heartburn.  Inpatient Medications    Scheduled Meds:  amiodarone  200 mg Oral BID   aspirin EC  81 mg Oral Daily   Chlorhexidine Gluconate Cloth  6 each Topical Q0600   diltiazem  120 mg Oral Daily   enoxaparin (LOVENOX) injection  40 mg Subcutaneous Q24H   ezetimibe  10 mg Oral Daily   insulin aspart  0-5 Units Subcutaneous QHS   insulin aspart  0-9 Units Subcutaneous TID WC   insulin aspart  3 Units Subcutaneous TID WC   insulin glargine-yfgn  20 Units Subcutaneous Q24H   pantoprazole  40 mg Oral BID AC   pregabalin  25 mg Oral BID   rosuvastatin  40 mg Oral Daily   tamsulosin  0.8 mg Oral Daily   vitamin B-12  100 mcg Oral Daily   Continuous Infusions:  sodium chloride 5 mL/hr at 08/01/22 0600   PRN Meds: sodium chloride, acetaminophen, alum & mag hydroxide-simeth, calcium carbonate, dextrose, hydrALAZINE, metoprolol tartrate, ondansetron (ZOFRAN) IV   Vital Signs    Vitals:   08/01/22 0300 08/01/22 0400 08/01/22 0500 08/01/22 0600  BP: 113/67 109/68 120/66 (!) 105/59  Pulse: 81 78 78 81  Resp: 15 17 15 19   Temp:      TempSrc:      SpO2: 95% 94% 97% 95%  Weight:        Intake/Output Summary (Last 24 hours) at 08/01/2022 1244 Last data filed at 08/01/2022 0600 Gross per 24 hour  Intake 1377.21 ml  Output 3550 ml  Net -2172.79 ml       07/28/2022    5:55 PM 07/27/2022    8:31 AM 07/19/2022   11:12 AM  Last 3 Weights  Weight (lbs) 189 lb 6 oz 180 lb 14.4 oz 193 lb  Weight (kg) 85.9 kg 82.056 kg 87.544 kg      Telemetry  Normal sinus rhythm- Personally Reviewed  ECG    No new tracings - Personally Reviewed  Physical Exam   GEN: No acute distress.   Neck: No JVD Cardiac: RRR, no murmurs,  rubs, or gallops.  Respiratory: Coarse to auscultation bilaterally. Respirations are unlabored on room air GI: Soft, nontender, non-distended  MS: No edema; No deformity. Neuro:  Nonfocal  Psych: Normal affect   Labs    High Sensitivity Troponin:   Recent Labs  Lab 07/28/22 1409 07/28/22 1633 07/28/22 1839 07/28/22 1938 07/28/22 2242  TROPONINIHS 26* 24* 38* 35* 47*      Chemistry Recent Labs  Lab 07/28/22 1409 07/28/22 1633 07/29/22 0950 07/30/22 0413 07/30/22 0839 07/31/22 0718 07/31/22 1205 08/01/22 0723  NA 136   < > 144 145   < > 139 137 135  K 4.0   < > 3.0* 3.5   < > 3.1* 3.7 3.8  CL 109   < > 119* 119*   < > 113* 110 105  CO2 <7*   < > 13* 8*   < > 21* 21* 22  GLUCOSE 377*   < > 203* 226*   < > 151* 217* 224*  BUN 23   < > 20 38*   < > 18 17 13   CREATININE 1.19   < > 0.88  0.95   < > 0.64 0.66 0.72  CALCIUM 9.3   < > 8.2* 8.3*   < > 7.9* 7.9* 7.8*  MG 2.6*   < > 2.2 2.3  --   --  1.9 1.9  PROT 6.8  --  5.3* 5.1*  --   --   --   --   ALBUMIN 3.7  --  2.7* 2.5*  --   --   --   --   AST 17  --  15 12*  --   --   --   --   ALT 27  --  19 17  --   --   --   --   ALKPHOS 80  --  52 46  --   --   --   --   BILITOT 1.9*  --  1.9* 2.3*  --   --   --   --   GFRNONAA >60   < > >60 >60   < > >60 >60 >60  ANIONGAP NOT CALCULATED   < > 12 18*   < > 5 6 8    < > = values in this interval not displayed.     Lipids  Recent Labs  Lab 07/29/22 0336  CHOL 83  TRIG 77  HDL 43  LDLCALC 25  CHOLHDL 1.9     Hematology Recent Labs  Lab 07/28/22 1409 07/29/22 0336 07/30/22 0413  WBC 14.2* 9.4 8.0  RBC 5.39 4.68 3.94*  HGB 16.2 14.3 12.1*  HCT 50.4 42.1 36.0*  MCV 93.5 90.0 91.4  MCH 30.1 30.6 30.7  MCHC 32.1 34.0 33.6  RDW 13.5 13.7 14.0  PLT 163 100* 132*    Thyroid  Recent Labs  Lab 07/28/22 1409  TSH 0.320*     BNPNo results for input(s): "BNP", "PROBNP" in the last 168 hours.  DDimer No results for input(s): "DDIMER" in the last 168 hours.    Radiology      Cardiac Studies  TTE 07/30/22  1. Left ventricular ejection fraction, by estimation, is 60 to 65%. The  left ventricle has normal function. The left ventricle has no regional  wall motion abnormalities. Left ventricular diastolic parameters are  consistent with Grade I diastolic  dysfunction (impaired relaxation).   2. Right ventricular systolic function is normal. The right ventricular  size is normal. There is normal pulmonary artery systolic pressure. The  estimated right ventricular systolic pressure is [id number] mmHg.   3. The mitral valve is normal in structure. No evidence of mitral valve  regurgitation. No evidence of mitral stenosis.   4. The aortic valve has an indeterminant number of cusps. There is mild  calcification of the aortic valve. Aortic valve regurgitation is not  visualized. Aortic valve sclerosis is present, with no evidence of aortic  valve stenosis.   5. The inferior vena cava is normal in size with greater than 50%  respiratory variability, suggesting right atrial pressure of 3 mmHg.   Patient Profile     67 y.o. male with a history of diabetes, hyperlipidemia, BPH was seen and seen and evaluated for paroxysmal atrial fibrillation with RVR in the setting of DKA.  Assessment & Plan    1. Paroxysmal atrial fibrillation: In the setting of DKA and metabolic abnormalities I switch amiodarone drip to oral amiodarone 200 mg twice daily.  This can be decreased to 200 mg once daily after 1 week. I changed short acting diltiazem to long-acting 120 mg once  daily. The plan is to treat him with amiodarone only short-term for about 2 months and then stop. -CHA2DS2-VASc score of 3 currently not on anticoagulation given short duration of atrial fibrillation and clear trigger,.  If he develops recurrent atrial fibrillation, he will require anticoagulation.  2. Elevated high-sensitivity troponins -Minimally elevated troponin in the setting of DKA and  tachycardia -Likely demand ischemia -Echocardiogram revealed normal LVEF 60 to 65%, with no regional wall motion abnormalities -Currently remains chest pain-free -EKG for pain or changes -Recommend outpatient ischemic cardiac evaluation with cardiac CTA or nuclear stress test.  3. DKA -Continued on IV fluids and insulin -Management per IM  4. Dyslipidemia -Continued on Zetia and rosuvastatin -LDL 25  For questions or updates, please contact [place] Please consult [email or web address] for contact info under        Signed, Kathlyn Sacramento, MD  08/01/2022, 12:44 PM

## 2022-08-01 NOTE — Progress Notes (Signed)
  Progress Note   Patient: Michael Gross [id number] DOB: [birth year] DOA: 07/28/2022     4 DOS: the patient was seen and examined on 08/01/2022   Brief hospital course: Michael Gross is a 67 y.o. male with medical history significant of diabetes mellitus, hypertension, hyperlipidemia, BPH, vertigo, who presents with weakness.   Patient recently had a colonoscopy, most of diabetic medication was on hold, he has been running high on glucose. Arriving the hospital, patient was diagnosed with DKA with a glucose of 377 bicarb less than 7, anion gap of 18.  He was initially placed on insulin drip, but improved, insulin drip was taken off 3/23.  Then he developed worsening metabolic acidosis with elevated anion gap again in the morning 3/24.  Insulin drip was restarted. Patient also had a new onset atrial fibrillation with RVR, amiodarone drip was started, cardiology consult obtained. Condition improved, transition to subcu insulin on 3/25.   Principal Problem:   Diabetic ketoacidosis without coma associated with type 2 diabetes mellitus ([place]) Active Problems:   Myocardial injury   Tachycardia   Essential hypertension   HLD (hyperlipidemia)   Leukocytosis   Benign prostatic hyperplasia without lower urinary tract symptoms   Paroxysmal atrial fibrillation ([facility])   Hypokalemia   Hypophosphatemia   GERD (gastroesophageal reflux disease)   Assessment and Plan:  Uncontrolled type 2 diabetes with DKA. Hypokalemia. Hypophosphatemia. Condition had improved, potassium and phosphorus level normalized.  Increase long-acting insulin dose.  A1C 11.3. Patient will need insulin at time of discharge   Paroxysmal atrial fibrillation with RVR. Echocardiogram showed ejection fraction 60 to 65%, with grade 1 diastolic dysfunction. Currently patient does not have volume overload. Patient heart rate is well-controlled, will continue diltiazem and amiodarone orally per cardiology.  Anticoagulation  not started due to short period of A-fib in the setting of DKA.   Gastroesophageal acid reflux. Having severe heartburn, continue Protonix twice a day, Tums, Maalox.  Essential hypertension. Dyslipidemia Continue diltiazem, patient is also on Zetia and Crestor.   Benign prostate hypertrophy. Continue Flomax.      Subjective:  Patient still complaining significant acid reflux.  Otherwise doing well.  Physical Exam: Vitals:   08/01/22 1000 08/01/22 1100 08/01/22 1200 08/01/22 1300  BP: 134/69 119/66 121/64 124/63  Pulse:   80 75  Resp: 19 15 16 14   Temp:   98.8 F (37.1 C)   TempSrc:   Oral   SpO2:   95% 97%  Weight:       General exam: Appears calm and comfortable  Respiratory system: Clear to auscultation. Respiratory effort normal. Cardiovascular system: S1 & S2 heard, RRR. No JVD, murmurs, rubs, gallops or clicks. No pedal edema. Gastrointestinal system: Abdomen is nondistended, soft and nontender. No organomegaly or masses felt. Normal bowel sounds heard. Central nervous system: Alert and oriented. No focal neurological deficits. Extremities: Symmetric 5 x 5 power. Skin: No rashes, lesions or ulcers Psychiatry: Judgement and insight appear normal. Mood & affect appropriate.    Data Reviewed:  Lab results reviewed.  Family Communication: Wife updated at bedside.  Disposition: Status is: Inpatient Remains inpatient appropriate because: Severity of disease. Will discharge home tomorrow.     Time spent: 35 minutes  Author: Sharen Hones, MD 08/01/2022 1:46 PM  For on call review [email or web address].

## 2022-08-02 ENCOUNTER — Inpatient Hospital Stay: Payer: Medicare Other

## 2022-08-02 DIAGNOSIS — E1110 Type 2 diabetes mellitus with ketoacidosis without coma: Secondary | ICD-10-CM | POA: Diagnosis not present

## 2022-08-02 DIAGNOSIS — E876 Hypokalemia: Secondary | ICD-10-CM | POA: Diagnosis not present

## 2022-08-02 DIAGNOSIS — I480 Paroxysmal atrial fibrillation: Secondary | ICD-10-CM | POA: Diagnosis not present

## 2022-08-02 LAB — MAGNESIUM: Magnesium: 1.9 mg/dL (ref 1.7–2.4)

## 2022-08-02 LAB — BASIC METABOLIC PANEL
Anion gap: 13 (ref 5–15)
BUN: 11 mg/dL (ref 8–23)
CO2: 27 mmol/L (ref 22–32)
Calcium: 7.7 mg/dL — ABNORMAL LOW (ref 8.9–10.3)
Chloride: 97 mmol/L — ABNORMAL LOW (ref 98–111)
Creatinine, Ser: 0.57 mg/dL — ABNORMAL LOW (ref 0.61–1.24)
GFR, Estimated: >60 mL/min (ref >60)
Glucose, Bld: 153 mg/dL — ABNORMAL HIGH (ref 70–99)
Potassium: 3.1 mmol/L — ABNORMAL LOW (ref 3.5–5.1)
Sodium: 137 mmol/L (ref 135–145)

## 2022-08-02 LAB — GLUCOSE, CAPILLARY
Glucose-Capillary: 156 mg/dL — ABNORMAL HIGH (ref 70–99)
Glucose-Capillary: 172 mg/dL — ABNORMAL HIGH (ref 70–99)

## 2022-08-02 LAB — PHOSPHORUS: Phosphorus: 2.9 mg/dL (ref 2.5–4.6)

## 2022-08-02 MED ORDER — ASPIRIN 81 MG PO TBEC: 81.0000 mg | DELAYED_RELEASE_TABLET | Freq: Every day | ORAL | 0 refills | Status: AC

## 2022-08-02 MED ORDER — PANTOPRAZOLE SODIUM 40 MG PO TBEC: 40.0000 mg | DELAYED_RELEASE_TABLET | Freq: Two times a day (BID) | ORAL | 0 refills | Status: DC

## 2022-08-02 MED ORDER — POTASSIUM CHLORIDE CRYS ER 20 MEQ PO TBCR
40.0000 meq | EXTENDED_RELEASE_TABLET | ORAL | Status: AC
  Administered 2022-08-02 (×2): 40 meq via ORAL
  Filled 2022-08-02: qty 2

## 2022-08-02 MED ORDER — DILTIAZEM HCL ER COATED BEADS 120 MG PO CP24: 120.0000 mg | ORAL_CAPSULE | Freq: Every day | ORAL | 0 refills | Status: DC

## 2022-08-02 MED ORDER — POTASSIUM CHLORIDE CRYS ER 10 MEQ PO TBCR: 10.0000 meq | EXTENDED_RELEASE_TABLET | Freq: Two times a day (BID) | ORAL | 0 refills | Status: DC

## 2022-08-02 MED ORDER — AMIODARONE HCL 200 MG PO TABS: 200.0000 mg | ORAL_TABLET | Freq: Two times a day (BID) | ORAL | 0 refills | Status: DC

## 2022-08-02 MED ORDER — INSULIN LISPRO (1 UNIT DIAL) 100 UNIT/ML (KWIKPEN): 4.0000 [IU] | PEN_INJECTOR | Freq: Three times a day (TID) | SUBCUTANEOUS | 0 refills | Status: DC

## 2022-08-02 MED ORDER — INSULIN GLARGINE 100 UNIT/ML SOLOSTAR PEN: 18.0000 [IU] | PEN_INJECTOR | Freq: Every day | SUBCUTANEOUS | 0 refills | Status: DC

## 2022-08-02 MED ORDER — SUCRALFATE 1 G PO TABS: 1.0000 g | ORAL_TABLET | Freq: Four times a day (QID) | ORAL | 0 refills | Status: DC

## 2022-08-02 NOTE — Discharge Summary (Signed)
Physician Discharge Summary   Patient: Michael Gross MRN: [id number] DOB: [birth year]  Admit date:     07/28/2022  Discharge date: 08/02/22  Discharge Physician: Sharen Hones   PCP: Jonetta Osgood, NP   Recommendations at discharge:   Follow-up with PCP in 1 week. Follow-up with cardiology in 2 weeks. Repeat BMP and magnesium at the next office visit.  Discharge Diagnoses: Principal Problem:   Diabetic ketoacidosis without coma associated with type 2 diabetes mellitus ([place]) Active Problems:   Myocardial injury   Tachycardia   Essential hypertension   HLD (hyperlipidemia)   Leukocytosis   Benign prostatic hyperplasia without lower urinary tract symptoms   Paroxysmal atrial fibrillation (HCC)   Hypokalemia   Hypophosphatemia   GERD (gastroesophageal reflux disease)  Resolved Problems:   * No resolved hospital problems. *  Hospital Course: Michael Gross is a 67 y.o. male with medical history significant of diabetes mellitus, hypertension, hyperlipidemia, BPH, vertigo, who presents with weakness.   Patient recently had a colonoscopy, most of diabetic medication was on hold, he has been running high on glucose. Arriving the hospital, patient was diagnosed with DKA with a glucose of 377 bicarb less than 7, anion gap of 18.  He was initially placed on insulin drip, but improved, insulin drip was taken off 3/23.  Then he developed worsening metabolic acidosis with elevated anion gap again in the morning 3/24.  Insulin drip was restarted. Patient also had a new onset atrial fibrillation with RVR, amiodarone drip was started, cardiology consult obtained. Condition improved, transition to subcu insulin on 3/25.  Assessment and Plan: Uncontrolled type 2 diabetes with DKA. Hypokalemia. Hypophosphatemia. Condition had improved, potassium and phosphorus level normalized.  Increase long-acting insulin dose.  A1C 11.3.  Patient is medically stable to be discharged, will continue  Lantus, scheduled Humalog, and sliding scale insulin.  Patient is instructed how to use insulin, and to follow-up with within 1 week time. Potassium is still 3.1 today, he will receive 80 mEq of KCl orally, continue 20 mEq daily for 7 days.  May need to repeat a BMP in next office visit.    Paroxysmal atrial fibrillation with RVR. Echocardiogram showed ejection fraction 60 to 65%, with grade 1 diastolic dysfunction. Currently patient does not have volume overload. Patient heart rate is well-controlled, will continue diltiazem and amiodarone orally per cardiology.  Anticoagulation not started due to short period of A-fib in the setting of DKA.   Gastroesophageal acid reflux. Having severe heartburn, on Protonix twice a day, Tums, Maalox. Patient does not have thrush, at discharge, will continue 2 weeks of Protonix twice a day and sucralfate.   Essential hypertension. Dyslipidemia Continue diltiazem, patient is also on Zetia and Crestor.   Benign prostate hypertrophy. Continue Flomax.       Consultants: Cardiology. Procedures performed: None  Disposition: Home Diet recommendation:  Discharge Diet Orders (From admission, onward)     Start     Ordered   08/02/22 0000  Diet - low sodium heart healthy        08/02/22 1035   08/02/22 0000  Diet Carb Modified        08/02/22 1035           Carb modified diet DISCHARGE MEDICATION: Allergies as of 08/02/2022   No Known Allergies      Medication List     STOP taking these medications    empagliflozin 25 MG Tabs tablet Commonly known as: JARDIANCE   glimepiride 4 MG tablet  Commonly known as: AMARYL   meloxicam 15 MG tablet Commonly known as: MOBIC   Semaglutide(0.25 or 0.5MG /DOS) 2 MG/3ML Sopn       TAKE these medications    amiodarone 200 MG tablet Commonly known as: PACERONE Take 1 tablet (200 mg total) by mouth 2 (two) times daily.   aspirin EC 81 MG tablet Take 1 tablet (81 mg total) by mouth daily.  Swallow whole. Start taking on: August 03, 2022   BD Pen Needle Nano U/F 32G X 4 MM Misc Generic drug: Insulin Pen Needle Use as directed with SOLIQUA to inject insulin every morning E11.65   diltiazem 120 MG 24 hr capsule Commonly known as: CARDIZEM CD Take 1 capsule (120 mg total) by mouth daily. Start taking on: August 03, 2022   ezetimibe 10 MG tablet Commonly known as: Zetia Take 1 tablet (10 mg total) by mouth daily.   insulin glargine 100 UNIT/ML Solostar Pen Commonly known as: LANTUS Inject 18 Units into the skin daily.   insulin lispro 100 UNIT/ML KwikPen Commonly known as: HumaLOG KwikPen Inject 4 Units into the skin 3 (three) times daily. Additionally add the following dose as sliding scale based on glucose check before meals CBG 70 - 120: 0 units CBG 121 - 150: 0 units CBG 151 - 200: 0 units CBG 201 - 250: 2 units CBG 251 - 300: 3 units CBG 301 - 350: 4 units CBG 351 - 400: 5 units   Lancets 30G Misc Use to check blood sugars 5 times per day   multivitamin with minerals Tabs tablet Take 1 tablet by mouth daily.   mupirocin ointment 2 % Commonly known as: BACTROBAN Apply 1 application. topically 2 (two) times daily.   pantoprazole 40 MG tablet Commonly known as: PROTONIX Take 1 tablet (40 mg total) by mouth 2 (two) times daily before a meal.   potassium chloride 10 MEQ tablet Commonly known as: KLOR-CON M Take 1 tablet (10 mEq total) by mouth 2 (two) times daily for 7 days.   pregabalin 25 MG capsule Commonly known as: LYRICA Take 1 capsule (25 mg total) by mouth 2 (two) times daily.   rosuvastatin 40 MG tablet Commonly known as: CRESTOR TAKE 1 TABLET BY MOUTH EVERY DAY   silodosin 8 MG Caps capsule Commonly known as: RAPAFLO Take 1 capsule (8 mg total) by mouth daily.   sucralfate 1 g tablet Commonly known as: Carafate Take 1 tablet (1 g total) by mouth 4 (four) times daily for 14 days.   tamsulosin 0.4 MG Caps capsule Commonly known as:  FLOMAX Take 2 capsules (0.8 mg total) by mouth daily.   triamcinolone cream 0.1 % Commonly known as: KENALOG Apply 1 application. topically 2 (two) times daily.   VITAMIN B-12 PO Take by mouth.   VITAMIN C PO Take by mouth.   VITAMIN D3 PO Take by mouth.        Follow-up Information     Abernathy, Yetta Flock, NP Follow up in 1 week(s).   Specialty: Nurse Practitioner Contact information: [place] [address] [phone]         Minna Merritts, MD Follow up in 2 week(s).   Specialty: Cardiology Contact information: [place] [address] [phone]                Discharge Exam: Danley Danker Weights   07/28/22 1755 08/01/22 1355  Weight: 85.9 kg 85.9 kg   General exam: Appears calm  and comfortable  Respiratory system: Clear to auscultation. Respiratory effort normal. Cardiovascular system: S1 & S2 heard, RRR. No JVD, murmurs, rubs, gallops or clicks. No pedal edema. Gastrointestinal system: Abdomen is nondistended, soft and nontender. No organomegaly or masses felt. Normal bowel sounds heard. Central nervous system: Alert and oriented. No focal neurological deficits. Extremities: Symmetric 5 x 5 power. Skin: No rashes, lesions or ulcers Psychiatry: Judgement and insight appear normal. Mood & affect appropriate.    Condition at discharge: good  The results of significant diagnostics from this hospitalization (including imaging, microbiology, ancillary and laboratory) are listed below for reference.   Imaging Studies: CT HEAD WO CONTRAST (5MM)  Result Date: 08/02/2022 CLINICAL DATA:  Vision loss, monocular. EXAM: CT HEAD WITHOUT CONTRAST TECHNIQUE: Contiguous axial images were obtained from the base of the skull through the vertex without intravenous contrast. RADIATION DOSE REDUCTION: This exam was performed according to the departmental dose-optimization program which includes automated exposure control,  adjustment of the mA and/or kV according to patient size and/or use of iterative reconstruction technique. COMPARISON:  None Available. FINDINGS: Brain: No acute hemorrhage, mass effect or midline shift. Gray-white differentiation is preserved. No hydrocephalus. No extra-axial collection. Basilar cisterns are patent. Vascular: No hyperdense vessel or unexpected calcification. Skull: No calvarial fracture or suspicious bone lesion. Skull base is unremarkable. Sinuses/Orbits: Right maxillary sinus hypoplasia. Orbits are unremarkable. Other: None. IMPRESSION: No acute intracranial abnormality. Electronically Signed   By: Emmit Alexanders M.D.   On: 08/02/2022 09:22   ECHOCARDIOGRAM COMPLETE  Result Date: 07/30/2022    ECHOCARDIOGRAM REPORT   Patient Name:   KIESHAWN SUPAN Date of Exam: 07/30/2022 Medical Rec #:  [id number]          Height:       75.0 in Accession #:    [id number]         Weight:       189.4 lb Date of Birth:  [birth year]          BSA:          2.144 m Patient Age:    67 years           BP:           114/68 mmHg Patient Gender: M                  HR:           103 bpm. Exam Location:  [facility] Procedure: 2D Echo Indications:     Atrial Fibrillation I48.91  History:         Patient has prior history of Echocardiogram examinations, most                  recent 11/18/2019.  Sonographer:     Kathlen Brunswick RDCS Referring Phys:  [place] Diagnosing Phys: Ida Rogue MD  Sonographer Comments: Suboptimal parasternal window and suboptimal subcostal window. Image acquisition challenging due to respiratory motion. IMPRESSIONS  1. Left ventricular ejection fraction, by estimation, is 60 to 65%. The left ventricle has normal function. The left ventricle has no regional wall motion abnormalities. Left ventricular diastolic parameters are consistent with Grade I diastolic dysfunction (impaired relaxation).  2. Right ventricular systolic function is normal. The right ventricular size is normal. There  is normal pulmonary artery systolic pressure. The estimated right ventricular systolic pressure is [id number] mmHg.  3. The mitral valve is normal in structure. No evidence of mitral valve regurgitation. No evidence of mitral stenosis.  4. The aortic valve has an indeterminant number of cusps. There is mild calcification of the aortic valve. Aortic valve regurgitation is not visualized. Aortic valve sclerosis is present, with no evidence of aortic valve stenosis.  5. The inferior vena cava is normal in size with greater than 50% respiratory variability, suggesting right atrial pressure of 3 mmHg. FINDINGS  Left Ventricle: Left ventricular ejection fraction, by estimation, is 60 to 65%. The left ventricle has normal function. The left ventricle has no regional wall motion abnormalities. The left ventricular internal cavity size was normal in size. There is  no left ventricular hypertrophy. Left ventricular diastolic parameters are consistent with Grade I diastolic dysfunction (impaired relaxation). Right Ventricle: The right ventricular size is normal. No increase in right ventricular wall thickness. Right ventricular systolic function is normal. There is normal pulmonary artery systolic pressure. The tricuspid regurgitant velocity is 2.49 m/s, and  with an assumed right atrial pressure of 5 mmHg, the estimated right ventricular systolic pressure is [id number] mmHg. Left Atrium: Left atrial size was normal in size. Right Atrium: Right atrial size was normal in size. Pericardium: There is no evidence of pericardial effusion. Mitral Valve: The mitral valve is normal in structure. No evidence of mitral valve regurgitation. No evidence of mitral valve stenosis. Tricuspid Valve: The tricuspid valve is normal in structure. Tricuspid valve regurgitation is mild . No evidence of tricuspid stenosis. Aortic Valve: The aortic valve has an indeterminant number of cusps. There is mild calcification of the aortic valve. Aortic valve  regurgitation is not visualized. Aortic valve sclerosis is present, with no evidence of aortic valve stenosis. Aortic valve  mean gradient measures 11.5 mmHg. Aortic valve peak gradient measures 21.3 mmHg. Aortic valve area, by VTI measures 2.61 cm. Pulmonic Valve: The pulmonic valve was normal in structure. Pulmonic valve regurgitation is not visualized. No evidence of pulmonic stenosis. Aorta: The aortic root is normal in size and structure. Venous: The inferior vena cava is normal in size with greater than 50% respiratory variability, suggesting right atrial pressure of 3 mmHg. IAS/Shunts: No atrial level shunt detected by color flow Doppler.  LEFT VENTRICLE PLAX 2D LVIDd:         4.30 cm   Diastology LVIDs:         2.70 cm   LV e' medial:    8.05 cm/s LV PW:         1.20 cm   LV E/e' medial:  11.5 LV IVS:        0.90 cm   LV e' lateral:   12.00 cm/s LVOT diam:     2.00 cm   LV E/e' lateral: 7.7 LV SV:         98 LV SV Index:   46 LVOT Area:     3.14 cm  RIGHT VENTRICLE RV Basal diam:  3.60 cm RV S prime:     21.50 cm/s TAPSE (M-mode): 3.3 cm LEFT ATRIUM             Index        RIGHT ATRIUM          Index LA Vol (A2C):   36.1 ml 16.84 ml/m  RA Area:     9.98 cm LA Vol (A4C):   51.4 ml 23.97 ml/m  RA Volume:   17.30 ml 8.07 ml/m LA Biplane Vol: 47.5 ml 22.15 ml/m  AORTIC VALVE  PULMONIC VALVE AV Area (Vmax):    2.45 cm      PV Vmax:       1.43 m/s AV Area (Vmean):   2.38 cm      PV Peak grad:  8.2 mmHg AV Area (VTI):     2.61 cm AV Vmax:           230.80 cm/s AV Vmean:          156.000 cm/s AV VTI:            0.377 m AV Peak Grad:      21.3 mmHg AV Mean Grad:      11.5 mmHg LVOT Vmax:         180.00 cm/s LVOT Vmean:        118.000 cm/s LVOT VTI:          0.313 m LVOT/AV VTI ratio: 0.83  AORTA Ao Root diam: 3.70 cm Ao Asc diam:  3.70 cm MITRAL VALVE                TRICUSPID VALVE MV Area (PHT): 4.21 cm     TR Peak grad:   24.8 mmHg MV Decel Time: 180 msec     TR Vmax:        249.00  cm/s MV E velocity: 92.70 cm/s MV A velocity: 108.00 cm/s  SHUNTS MV E/A ratio:  0.86         Systemic VTI:  0.31 m                             Systemic Diam: 2.00 cm Ida Rogue MD Electronically signed by Ida Rogue MD Signature Date/Time: 07/30/2022/12:48:52 PM    Final    DG Chest Port 1 View  Result Date: 07/28/2022 CLINICAL DATA:  Leukocytosis. EXAM: PORTABLE CHEST 1 VIEW COMPARISON:  Chest radiograph dated 07/28/2022. FINDINGS: No focal consolidation, pleural effusion or pneumothorax. The cardiac silhouette is within limits. Atherosclerotic calcification of the aorta. No acute osseous pathology. Degenerative changes of the spine. IMPRESSION: No active disease. Electronically Signed   By: Anner Crete M.D.   On: 07/28/2022 17:36   DG Chest Portable 1 View  Result Date: 07/28/2022 CLINICAL DATA:  Chest pain EXAM: PORTABLE CHEST 1 VIEW COMPARISON:  X-ray 05/19/2019 FINDINGS: Calcified aorta. No consolidation, pneumothorax or effusion. No edema. Normal cardiopericardial silhouette. Overlapping cardiac leads. Degenerative changes are seen along the spine. IMPRESSION: No acute cardiopulmonary disease Electronically Signed   By: Jill Side M.D.   On: 07/28/2022 14:35    Microbiology: Results for orders placed or performed during the hospital encounter of 07/28/22  MRSA Next Gen by PCR, Nasal     Status: None   Collection Time: 07/28/22  5:58 PM   Specimen: Nasal Mucosa; Nasal Swab  Result Value Ref Range Status   MRSA by PCR Next Gen NOT DETECTED NOT DETECTED Final    Comment: (NOTE) The GeneXpert MRSA Assay (FDA approved for NASAL specimens only), is one component of a comprehensive MRSA colonization surveillance program. It is not intended to diagnose MRSA infection nor to guide or monitor treatment for MRSA infections. Test performance is not FDA approved in patients less than 98 years old. Performed at [facility], [address] [place], [place] [address]      Labs: CBC: Recent Labs  Lab 07/28/22 1409 07/29/22 0336 07/30/22 0413  WBC 14.2* 9.4 8.0  NEUTROABS 12.0*  --   --  HGB 16.2 14.3 12.1*  HCT 50.4 42.1 36.0*  MCV 93.5 90.0 91.4  PLT 163 100* Q000111Q*   Basic Metabolic Panel: Recent Labs  Lab 07/29/22 0950 07/30/22 0413 07/30/22 0839 07/30/22 2342 07/31/22 0718 07/31/22 1205 08/01/22 0723 08/02/22 0411  NA 144 145   < > 145 139 137 135 137  K 3.0* 3.5   < > 3.4* 3.1* 3.7 3.8 3.1*  CL 119* 119*   < > 118* 113* 110 105 97*  CO2 13* 8*   < > 17* 21* 21* 22 27  GLUCOSE 203* 226*   < > 174* 151* 217* 224* 153*  BUN 20 38*   < > 21 18 17 13 11   CREATININE 0.88 0.95   < > 0.69 0.64 0.66 0.72 0.57*  CALCIUM 8.2* 8.3*   < > 8.2* 7.9* 7.9* 7.8* 7.7*  MG 2.2 2.3  --   --   --  1.9 1.9 1.9  PHOS <1.0* 2.1*  --   --   --  <1.0* 3.1 2.9   < > = values in this interval not displayed.   Liver Function Tests: Recent Labs  Lab 07/28/22 1409 07/29/22 0950 07/30/22 0413  AST 17 15 12*  ALT 27 19 17   ALKPHOS 80 52 46  BILITOT 1.9* 1.9* 2.3*  PROT 6.8 5.3* 5.1*  ALBUMIN 3.7 2.7* 2.5*   CBG: Recent Labs  Lab 08/01/22 0733 08/01/22 1202 08/01/22 1625 08/01/22 2046 08/02/22 0754  GLUCAP 196* 212* 170* 172* 156*    Discharge time spent: greater than 30 minutes.  Signed: Sharen Hones, MD [facility] 08/02/2022

## 2022-08-02 NOTE — Progress Notes (Signed)
Rounding Note    Patient Name: Michael Gross Date of Encounter: 08/02/2022  [place] Cardiologist: Ida Rogue, MD   Subjective   Patient seen on a.m. rounds.  Denies any chest pain or shortness of breath.  Continues to have occasional acid reflux.  Inpatient Medications    Scheduled Meds:  amiodarone  200 mg Oral BID   aspirin EC  81 mg Oral Daily   Chlorhexidine Gluconate Cloth  6 each Topical Q0600   diltiazem  120 mg Oral Daily   enoxaparin (LOVENOX) injection  40 mg Subcutaneous Q24H   ezetimibe  10 mg Oral Daily   insulin aspart  0-5 Units Subcutaneous QHS   insulin aspart  0-9 Units Subcutaneous TID WC   insulin aspart  3 Units Subcutaneous TID WC   insulin glargine-yfgn  20 Units Subcutaneous Q24H   pantoprazole  40 mg Oral BID AC   pregabalin  25 mg Oral BID   rosuvastatin  40 mg Oral Daily   tamsulosin  0.8 mg Oral Daily   vitamin B-12  100 mcg Oral Daily   Continuous Infusions:  sodium chloride 5 mL/hr at 08/01/22 1300   PRN Meds: sodium chloride, acetaminophen, alum & mag hydroxide-simeth, calcium carbonate, dextrose, hydrALAZINE, metoprolol tartrate, ondansetron (ZOFRAN) IV, mouth rinse   Vital Signs    Vitals:   08/01/22 1600 08/01/22 2045 08/01/22 2348 08/02/22 0352  BP: 110/69 (!) 106/58 (!) 109/59 119/67  Pulse: 82 79 81 78  Resp: 20 16 18 16   Temp: 98.5 F (36.9 C) 98.9 F (37.2 C) 98.9 F (37.2 C) 98.6 F (37 C)  TempSrc:  Oral  Oral  SpO2: 95% 96% 94% 97%  Weight:      Height:        Intake/Output Summary (Last 24 hours) at 08/02/2022 0742 Last data filed at 08/01/2022 2300 Gross per 24 hour  Intake 1436.31 ml  Output 750 ml  Net 686.31 ml      08/01/2022    1:55 PM 07/28/2022    5:55 PM 07/27/2022    8:31 AM  Last 3 Weights  Weight (lbs) 189 lb 6 oz 189 lb 6 oz 180 lb 14.4 oz  Weight (kg) 85.9 kg 85.9 kg 82.056 kg      Telemetry    Sinus rhythm first-degree AV block rates of 70-80- Personally  Reviewed  ECG    No new tracings- Personally Reviewed  Physical Exam   GEN: No acute distress.   Neck: No JVD Cardiac: RRR, no murmurs, rubs, or gallops.  Respiratory: Clear upper lobes with diminished bases to auscultation bilaterally.  Respirations remain unlabored on room air GI: Soft, nontender, non-distended  MS: No edema; No deformity. Neuro:  Nonfocal  Psych: Normal affect   Labs    High Sensitivity Troponin:   Recent Labs  Lab 07/28/22 1409 07/28/22 1633 07/28/22 1839 07/28/22 1938 07/28/22 2242  TROPONINIHS 26* 24* 38* 35* 47*     Chemistry Recent Labs  Lab 07/28/22 1409 07/28/22 1633 07/29/22 0950 07/30/22 0413 07/30/22 0839 07/31/22 1205 08/01/22 0723 08/02/22 0411  NA 136   < > 144 145   < > 137 135 137  K 4.0   < > 3.0* 3.5   < > 3.7 3.8 3.1*  CL 109   < > 119* 119*   < > 110 105 97*  CO2 <7*   < > 13* 8*   < > 21* 22 27  GLUCOSE 377*   < >  203* 226*   < > 217* 224* 153*  BUN 23   < > 20 38*   < > 17 13 11   CREATININE 1.19   < > 0.88 0.95   < > 0.66 0.72 0.57*  CALCIUM 9.3   < > 8.2* 8.3*   < > 7.9* 7.8* 7.7*  MG 2.6*   < > 2.2 2.3  --  1.9 1.9 1.9  PROT 6.8  --  5.3* 5.1*  --   --   --   --   ALBUMIN 3.7  --  2.7* 2.5*  --   --   --   --   AST 17  --  15 12*  --   --   --   --   ALT 27  --  19 17  --   --   --   --   ALKPHOS 80  --  52 46  --   --   --   --   BILITOT 1.9*  --  1.9* 2.3*  --   --   --   --   GFRNONAA >60   < > >60 >60   < > >60 >60 >60  ANIONGAP NOT CALCULATED   < > 12 18*   < > 6 8 13    < > = values in this interval not displayed.    Lipids  Recent Labs  Lab 07/29/22 0336  CHOL 83  TRIG 77  HDL 43  LDLCALC 25  CHOLHDL 1.9    Hematology Recent Labs  Lab 07/28/22 1409 07/29/22 0336 07/30/22 0413  WBC 14.2* 9.4 8.0  RBC 5.39 4.68 3.94*  HGB 16.2 14.3 12.1*  HCT 50.4 42.1 36.0*  MCV 93.5 90.0 91.4  MCH 30.1 30.6 30.7  MCHC 32.1 34.0 33.6  RDW 13.5 13.7 14.0  PLT 163 100* 132*   Thyroid  Recent Labs  Lab  07/28/22 1409  TSH 0.320*    BNPNo results for input(s): "BNP", "PROBNP" in the last 168 hours.  DDimer No results for input(s): "DDIMER" in the last 168 hours.   Radiology    No results found.  Cardiac Studies   TTE 07/30/22  1. Left ventricular ejection fraction, by estimation, is 60 to 65%. The  left ventricle has normal function. The left ventricle has no regional  wall motion abnormalities. Left ventricular diastolic parameters are  consistent with Grade I diastolic  dysfunction (impaired relaxation).   2. Right ventricular systolic function is normal. The right ventricular  size is normal. There is normal pulmonary artery systolic pressure. The  estimated right ventricular systolic pressure is [id number] mmHg.   3. The mitral valve is normal in structure. No evidence of mitral valve  regurgitation. No evidence of mitral stenosis.   4. The aortic valve has an indeterminant number of cusps. There is mild  calcification of the aortic valve. Aortic valve regurgitation is not  visualized. Aortic valve sclerosis is present, with no evidence of aortic  valve stenosis.   5. The inferior vena cava is normal in size with greater than 50%  respiratory variability, suggesting right atrial pressure of 3 mmHg.   Patient Profile     67 y.o. male with a history of diabetes, hyperlipidemia, BPH and seen and evaluated for paroxysmal atrial fibrillation with RVR in the setting of DKA.  Assessment & Plan    Paroxysmal atrial fibrillation -Continues to remain in sinus rhythm -IV amiodarone transition to oral amiodarone 200 mg twice  daily which can be decreased to 200 mg once daily after 1 week -The plan will be to likely stop the amiodarone after approximately 2 months -He is continued on diltiazem 120 mg daily -CHA2DS2-VASc of 3 currently not on anticoagulation given short duration of atrial fibrillation and clear trigger, if he develops recurrent atrial fibrillation will require long-term  anticoagulation  Elevated high-sensitivity troponin -Minimally elevated troponin in setting of DKA and tachycardia -Likely secondary to demand ischemia -Echocardiogram completed with an LVEF of 60 to 65% with no regional wall motion abnormalities -Has remained chest pain-free -Recommend outpatient ischemic cardiac evaluation with cardiac CTA or nuclear stress testing  Dyslipidemia -Continued on Zetia and rosuvastatin -LDL 25  Essential hypertension -Blood pressure 119/67 -Continued on current medication regimen -Vital signs per unit protocol  Electrolyte derangement -Continued hypokalemia today -Serum potassium 3.1 -Potassium supplementation ordered -Daily BMP while hospitalized -Recommend keeping potassium level greater than 4 less than 5      For questions or updates, please contact [place] Please consult [email or web address] for contact info under        Signed, Tanice Petre, NP  08/02/2022, 7:42 AM

## 2022-08-02 NOTE — Care Management Important Message (Signed)
Important Message  Patient Details  Name: Michael Gross MRN: [id number] Date of Birth: Jul 23, 1955   Medicare Important Message Given:  Yes     Dannette Barbara 08/02/2022, 12:00 PM

## 2022-08-02 NOTE — Inpatient Diabetes Management (Signed)
Inpatient Diabetes Program Recommendations  AACE/ADA: New Consensus Statement on Inpatient Glycemic Control   Target Ranges:  Prepandial:   less than 140 mg/dL      Peak postprandial:   less than 180 mg/dL (1-2 hours)      Critically ill patients:  140 - 180 mg/dL    Latest Reference Range & Units 08/01/22 07:33 08/01/22 12:02 08/01/22 16:25 08/01/22 20:46 08/02/22 07:54  Glucose-Capillary 70 - 99 mg/dL 196 (H) 212 (H) 170 (H) 172 (H) 156 (H)   Review of Glycemic Control  Diabetes history: DM2 Outpatient Diabetes medications: Jardiance 25 mg daily, Amaryl 4 mg BID, Ozempic 0.5 mg Qweek, Soliqua 100-33 unit-mcg/ml 32 units QAM (not taking; stopped 04/13/2022)  Current orders for Inpatient glycemic control: Semglee 20 units Q24H, Novolog 3 units TID with meals, Novolog 0-9 units TID with meals, Novolog 0-5 units QHS  NOTE: Patient being discharged home today. Dr. Roosevelt Locks provided permission for patient to receive FreeStyle Libre3 CGM sensor samples.  Educated patient and wife on FreeStyle Libre3 CGM regarding application and changing CGM sensor (alternate every 14 days on back of arms), 1 hour warm-up, how to scan sensor to start a new sensor, and how to use app to check glucose.  Patient has been given Freestyle Libre3 sensor sample.  Provided educational packet regarding FreeStyle Libre3 CGM.  Patient plans to use iphone FreeStyle Libre3 app to read FreeStyle Libre sensor and was able to download the app and create an account. Patient applied FreeStyle Libre3 sensor to back of upper left arm at 10:35 am. Reminded patient that the app will provide glucose readings after the 1 hour warm up period.  Asked patient to be sure to let PCP know about Maurine Simmering and allow provider to review reports from [place] app so the provider can use the information to continue to make adjustments with DM medications if needed. Patient states that attending provider said he will be discharged on Lantus and Humalog. Patient has  used insulin and is comfortable using insulin outpatient. Patient does note that his medications are going to be expensive and he is planning to check into any patient medication assistance programs with medication manufactures. Also plans to check with [employer] to see if they have any programs for the FreeStyle Libre3. Encouraged patient to reach out to PCP if he has any issues with hypoglycemia so that DM medications could be adjusted if needed.   Patient verbalized understanding of information and has no questions at this time.   Thanks, Barnie Alderman, RN, MSN, [place] Diabetes Coordinator Inpatient Diabetes Program [phone] (Team Pager from 8am to [place])

## 2022-08-02 NOTE — Progress Notes (Signed)
Notified Neomia Glass, NP of K 3.1.

## 2022-08-10 ENCOUNTER — Encounter: Payer: Self-pay | Admitting: Nurse Practitioner

## 2022-08-10 ENCOUNTER — Other Ambulatory Visit: Payer: Self-pay | Admitting: Nurse Practitioner

## 2022-08-10 ENCOUNTER — Other Ambulatory Visit: Payer: Self-pay | Admitting: Urology

## 2022-08-10 ENCOUNTER — Ambulatory Visit (INDEPENDENT_AMBULATORY_CARE_PROVIDER_SITE_OTHER): Payer: Medicare Other | Admitting: Nurse Practitioner

## 2022-08-10 VITALS — BP 121/60 | HR 92 | Temp 96.9°F | Resp 16 | Ht 75.0 in | Wt 194.4 lb

## 2022-08-10 DIAGNOSIS — Z09 Encounter for follow-up examination after completed treatment for conditions other than malignant neoplasm: Secondary | ICD-10-CM

## 2022-08-10 DIAGNOSIS — N401 Enlarged prostate with lower urinary tract symptoms: Secondary | ICD-10-CM

## 2022-08-10 DIAGNOSIS — R351 Nocturia: Secondary | ICD-10-CM

## 2022-08-10 DIAGNOSIS — E1165 Type 2 diabetes mellitus with hyperglycemia: Secondary | ICD-10-CM

## 2022-08-10 DIAGNOSIS — I480 Paroxysmal atrial fibrillation: Secondary | ICD-10-CM

## 2022-08-10 DIAGNOSIS — E1140 Type 2 diabetes mellitus with diabetic neuropathy, unspecified: Secondary | ICD-10-CM | POA: Diagnosis not present

## 2022-08-10 DIAGNOSIS — Z79899 Other long term (current) drug therapy: Secondary | ICD-10-CM

## 2022-08-10 MED ORDER — INSULIN GLARGINE 100 UNIT/ML SOLOSTAR PEN: 20.0000 [IU] | PEN_INJECTOR | Freq: Every day | SUBCUTANEOUS | 2 refills | Status: DC

## 2022-08-10 MED ORDER — AMIODARONE HCL 200 MG PO TABS: 200.0000 mg | ORAL_TABLET | Freq: Two times a day (BID) | ORAL | 0 refills | Status: DC

## 2022-08-10 MED ORDER — DILTIAZEM HCL ER COATED BEADS 120 MG PO CP24: 120.0000 mg | ORAL_CAPSULE | Freq: Every day | ORAL | 2 refills | Status: DC

## 2022-08-10 MED ORDER — PANTOPRAZOLE SODIUM 40 MG PO TBEC: 40.0000 mg | DELAYED_RELEASE_TABLET | Freq: Two times a day (BID) | ORAL | 3 refills | Status: DC

## 2022-08-10 MED ORDER — TAMSULOSIN HCL 0.4 MG PO CAPS: 0.8000 mg | ORAL_CAPSULE | Freq: Every day | ORAL | 1 refills | Status: DC

## 2022-08-10 MED ORDER — EZETIMIBE 10 MG PO TABS: 10.0000 mg | ORAL_TABLET | Freq: Every day | ORAL | 1 refills | Status: DC

## 2022-08-10 MED ORDER — INSULIN LISPRO (1 UNIT DIAL) 100 UNIT/ML (KWIKPEN): PEN_INJECTOR | SUBCUTANEOUS | 2 refills | Status: DC

## 2022-08-10 MED ORDER — EMPAGLIFLOZIN 25 MG PO TABS: 25.0000 mg | ORAL_TABLET | Freq: Every day | ORAL | 5 refills | Status: DC

## 2022-08-10 NOTE — Progress Notes (Signed)
[facility] Michael Gross, [address] [place] [id number] [facility] Discharge Acute Issues Care Follow Up                                                                        Patient Demographics  Michael Gross, is a 67 y.o. male  DOB Jan 18, 1956  MRN [id number].  Primary MD  Michael Osgood, NP   Reason for TCC follow Up - DKA   Past Medical History:  Diagnosis Date   Arthritis    ankles   Basal cell carcinoma    BPH (benign prostatic hyperplasia)    Complication of anesthesia    slow to wake   Diabetes    a. diagnosed in his 88's.   History of echocardiogram    a. 11/2019 Echo: EF 60-65%, no rwma, nl RV fxn, no significant valvular abnormality.   Hyperlipidemia    PAF (paroxysmal atrial fibrillation)    a. 07/2022 noted in the setting of DKA - brief.  CHA2DS2VASc = 3-->OAC deferred for now.   Vertigo    none for over 15 years    Past Surgical History:  Procedure Laterality Date   COLONOSCOPY WITH PROPOFOL N/A 07/27/2022   Procedure: COLONOSCOPY WITH PROPOFOL;  Surgeon: Lin Landsman, MD;  Location: [place] [place];  Service: Endoscopy;  Laterality: N/A;  Diabetic   HERNIA REPAIR     2003   KNEE SURGERY     1998   MOHS SURGERY     2003   SHOULDER ARTHROSCOPY WITH OPEN ROTATOR CUFF REPAIR AND DISTAL CLAVICLE ACROMINECTOMY Left 08/11/2021   Procedure: SHOULDER ARTHROSCOPY WITH OPEN ROTATOR CUFF REPAIR AND DISTAL CLAVICLE ACROMINECTOMY;  Surgeon: Thornton Park, MD;  Location: [facility];  Service: Orthopedics;  Laterality: Left;       Recent HPI and Hospital Course  Hospital Course: Michael Gross is a 67 y.o. male with medical history significant of diabetes mellitus, hypertension, hyperlipidemia, BPH, vertigo, who presents with weakness.   Patient recently had a colonoscopy, most of diabetic medication was on hold, he has been running  high on glucose. Arriving the hospital, patient was diagnosed with DKA with a glucose of 377 bicarb less than 7, anion gap of 18.  He was initially placed on insulin drip, but improved, insulin drip was taken off 3/23.  Then he developed worsening metabolic acidosis with elevated anion gap again in the morning 3/24.  Insulin drip was restarted. Patient also had a new onset atrial fibrillation with RVR, amiodarone drip was started, cardiology consult obtained. Condition improved, transition to subcu insulin on 3/25.   Assessment and Plan: Uncontrolled type 2 diabetes with DKA. Hypokalemia. Hypophosphatemia. Condition had improved, potassium and phosphorus level normalized.  Increase long-acting insulin dose.  A1C 11.3.  Patient is medically stable to be discharged, will continue Lantus, scheduled Humalog, and sliding scale insulin.  Patient is instructed how to use insulin, and to follow-up with within 1 week time. Potassium is still 3.1 today, he  will receive 80 mEq of KCl orally, continue 20 mEq daily for 7 days.  May need to repeat a BMP in next office visit.     Paroxysmal atrial fibrillation with RVR. Echocardiogram showed ejection fraction 60 to 65%, with grade 1 diastolic dysfunction. Currently patient does not have volume overload. Patient heart rate is well-controlled, will continue diltiazem and amiodarone orally per cardiology.  Anticoagulation not started due to short period of A-fib in the setting of DKA.   Gastroesophageal acid reflux. Having severe heartburn, on Protonix twice a day, Tums, Maalox. Patient does not have thrush, at discharge, will continue 2 weeks of Protonix twice a day and sucralfate.   Essential hypertension. Dyslipidemia Continue diltiazem, patient is also on Zetia and Crestor.   Benign prostate hypertrophy. Continue Flomax.  [facility] Acute Care Issue to be followed in the Clinic   Principal Problem:   Diabetic ketoacidosis without coma  associated with type 2 diabetes mellitus ([place]) Active Problems:   Myocardial injury   Tachycardia   Essential hypertension   HLD (hyperlipidemia)   Leukocytosis   Benign prostatic hyperplasia without lower urinary tract symptoms   Paroxysmal atrial fibrillation (HCC)   Hypokalemia   Hypophosphatemia   GERD (gastroesophageal reflux disease)   Subjective:   Michael Gross today has, No headache, No chest pain, No abdominal pain - No Nausea, No new weakness tingling or numbness, No Cough - SOB.   Assessment & Plan   1. Hospital discharge follow-up Repeat labs ordered.  - BMP8+1AC  2. Paroxysmal atrial fibrillation Continue amiodarone and diltiazem as prescribed.  Also continue jardiance as prescribed.  Follow up with cardiology as recommended.  - amiodarone (PACERONE) 200 MG tablet; Take 1 tablet (200 mg total) by mouth 2 (two) times daily.  Dispense: 60 tablet; Refill: 0 - diltiazem (CARDIZEM CD) 120 MG 24 hr capsule; Take 1 capsule (120 mg total) by mouth daily.  Dispense: 30 capsule; Refill: 2  3. Uncontrolled type 2 diabetes mellitus with hyperglycemia Repeat lab ordered Continue jardiance as prescribed.  Stop ozempic Lantus dose adjusted to 20 units daily at bedtime. If fasting sugar is >150 for 3 or more days in a row, increase dose by 2-4 units. Wait at least 4 days between dose changes.  Continue bolus insulin dosing before meals with insulin lispro, patient provided with sliding scale, see below: Less than 150: no insulin Glucose 151-200: 4 units  Glucose 201-250: 6 units  Glucose 251-300: 8 units  Glucose 301-350: 10 units  Glucose 351-400: 12 units  Glucose >401: 14 units  - insulin glargine (LANTUS) 100 UNIT/ML Solostar Pen; Inject 20 Units into the skin at bedtime.  Dispense: 15 mL; Refill: 2 - BMP8+1AC - insulin lispro (HUMALOG KWIKPEN) 100 UNIT/ML KwikPen; Inject insulin dose into the skin before meals per sliding scale. Max dose per 24 hours: 42 units.   Dispense: 15 mL; Refill: 2 - empagliflozin (JARDIANCE) 25 MG TABS tablet; Take 1 tablet (25 mg total) by mouth daily.  Dispense: 30 tablet; Refill: 5  4. Neuropathy due to type 2 diabetes mellitus Continue pregabalin 25 mg twice daily for neuropathy.   5. Benign prostatic hyperplasia with nocturia Continue tamsulosin as prescribed - tamsulosin (FLOMAX) 0.4 MG CAPS capsule; Take 2 capsules (0.8 mg total) by mouth daily.  Dispense: 180 capsule; Refill: 1  6. Encounter for medication review Medication list reviewed with patient, refills ordered - ezetimibe (ZETIA) 10 MG tablet; Take 1 tablet (10 mg total) by mouth daily.  Dispense: 90  tablet; Refill: 1 - pantoprazole (PROTONIX) 40 MG tablet; Take 1 tablet (40 mg total) by mouth 2 (two) times daily before a meal.  Dispense: 60 tablet; Refill: 3    Reason for frequent admissions/ER visits       Objective:   Vitals:   08/10/22 1129  BP: 121/60  Pulse: 92  Resp: 16  Temp: (!) 96.9 F (36.1 C)  SpO2: 97%  Weight: 194 lb 6.4 oz (88.2 kg)  Height: 6\' 3"  (1.905 m)    Wt Readings from Last 3 Encounters:  08/10/22 194 lb 6.4 oz (88.2 kg)  08/01/22 189 lb 6 oz (85.9 kg)  07/27/22 180 lb 14.4 oz (82.1 kg)    Allergies as of 08/10/2022   No Known Allergies      Medication List        Accurate as of August 10, 2022  1:29 PM. If you have any questions, ask your nurse or doctor.          amiodarone 200 MG tablet Commonly known as: PACERONE Take 1 tablet (200 mg total) by mouth 2 (two) times daily.   aspirin EC 81 MG tablet Take 1 tablet (81 mg total) by mouth daily. Swallow whole.   BD Pen Needle Nano U/F 32G X 4 MM Misc Generic drug: Insulin Pen Needle Use as directed with SOLIQUA to inject insulin every morning E11.65   diltiazem 120 MG 24 hr capsule Commonly known as: CARDIZEM CD Take 1 capsule (120 mg total) by mouth daily.   ezetimibe 10 MG tablet Commonly known as: Zetia Take 1 tablet (10 mg total) by mouth  daily.   insulin glargine 100 UNIT/ML Solostar Pen Commonly known as: LANTUS Inject 18 Units into the skin daily.   insulin lispro 100 UNIT/ML KwikPen Commonly known as: HumaLOG KwikPen Inject 4 Units into the skin 3 (three) times daily. Additionally add the following dose as sliding scale based on glucose check before meals CBG 70 - 120: 0 units CBG 121 - 150: 0 units CBG 151 - 200: 0 units CBG 201 - 250: 2 units CBG 251 - 300: 3 units CBG 301 - 350: 4 units CBG 351 - 400: 5 units   Lancets 30G Misc Use to check blood sugars 5 times per day   multivitamin with minerals Tabs tablet Take 1 tablet by mouth daily.   mupirocin ointment 2 % Commonly known as: BACTROBAN Apply 1 application. topically 2 (two) times daily.   pantoprazole 40 MG tablet Commonly known as: PROTONIX Take 1 tablet (40 mg total) by mouth 2 (two) times daily before a meal.   potassium chloride 10 MEQ tablet Commonly known as: KLOR-CON M Take 1 tablet (10 mEq total) by mouth 2 (two) times daily for 7 days.   pregabalin 25 MG capsule Commonly known as: LYRICA Take 1 capsule (25 mg total) by mouth 2 (two) times daily.   rosuvastatin 40 MG tablet Commonly known as: CRESTOR TAKE 1 TABLET BY MOUTH EVERY DAY   silodosin 8 MG Caps capsule Commonly known as: RAPAFLO TAKE 1 CAPSULE (8 MG TOTAL) BY MOUTH DAILY.   sucralfate 1 g tablet Commonly known as: Carafate Take 1 tablet (1 g total) by mouth 4 (four) times daily for 14 days.   tamsulosin 0.4 MG Caps capsule Commonly known as: FLOMAX Take 2 capsules (0.8 mg total) by mouth daily.   triamcinolone cream 0.1 % Commonly known as: KENALOG Apply 1 application. topically 2 (two) times daily.   VITAMIN B-12  PO Take by mouth.   VITAMIN C PO Take by mouth.   VITAMIN D3 PO Take by mouth.         Physical Exam: Constitutional: Patient appears well-developed and well-nourished. Not in obvious distress. HENT: Normocephalic, atraumatic, External right  and left ear normal. Oropharynx is clear and moist.  Eyes: Conjunctivae and EOM are normal. PERRLA, no scleral icterus. Neck: Normal ROM. Neck supple. No JVD. No tracheal deviation. No thyromegaly. CVS: RRR, S1/S2 +, no murmurs, no gallops, no carotid bruit.  Pulmonary: Effort and breath sounds normal, no stridor, rhonchi, wheezes, rales.  Abdominal: Soft. BS +, no distension, tenderness, rebound or guarding.  Musculoskeletal: Normal range of motion. No edema and no tenderness.  Lymphadenopathy: No lymphadenopathy noted, cervical, inguinal or axillary Neuro: Alert. Normal reflexes, muscle tone coordination. No cranial nerve deficit. Skin: Skin is warm and dry. No rash noted. Not diaphoretic. No erythema. No pallor. Psychiatric: Normal mood and affect. Behavior, judgment, thought content normal.   Data Review   Micro Results No results found for this or any previous visit (from the past 240 hour(s)).   CBC No results for input(s): "WBC", "HGB", "HCT", "PLT", "MCV", "MCH", "MCHC", "RDW", "LYMPHSABS", "MONOABS", "EOSABS", "BASOSABS", "BANDABS" in the last 168 hours.  Invalid input(s): "NEUTRABS", "BANDSABD"  Chemistries  No results for input(s): "NA", "K", "CL", "CO2", "GLUCOSE", "BUN", "CREATININE", "CALCIUM", "MG", "AST", "ALT", "ALKPHOS", "BILITOT" in the last 168 hours.  Invalid input(s): "GFRCGP" ------------------------------------------------------------------------------------------------------------------ estimated creatinine clearance is 108.6 mL/min (A) (by C-G formula based on SCr of 0.57 mg/dL (L)). ------------------------------------------------------------------------------------------------------------------ No results for input(s): "HGBA1C" in the last 72 hours. ------------------------------------------------------------------------------------------------------------------ No results for input(s): "CHOL", "HDL", "LDLCALC", "TRIG", "CHOLHDL", "LDLDIRECT" in the last  72 hours. ------------------------------------------------------------------------------------------------------------------ No results for input(s): "TSH", "T4TOTAL", "T3FREE", "THYROIDAB" in the last 72 hours.  Invalid input(s): "FREET3" ------------------------------------------------------------------------------------------------------------------ No results for input(s): "VITAMINB12", "FOLATE", "FERRITIN", "TIBC", "IRON", "RETICCTPCT" in the last 72 hours.  Coagulation profile No results for input(s): "INR", "PROTIME" in the last 168 hours.  No results for input(s): "DDIMER" in the last 72 hours.  Cardiac Enzymes No results for input(s): "CKMB", "TROPONINI", "MYOGLOBIN" in the last 168 hours.  Invalid input(s): "CK" ------------------------------------------------------------------------------------------------------------------ Invalid input(s): "POCBNP"  Return in about 3 weeks (around 08/31/2022) for F/U insuling dose adjustment and glucose log , Shimon Trowbridge PCP.  Time Spent in minutes  45 Time spent with patient included reviewing progress notes, labs, imaging studies, and discussing plan for follow up.   This patient was seen by Michael Osgood, FNP-C in collaboration with Dr. Clayborn Bigness as a part of collaborative care agreement.   Michael Osgood MSN, FNP-C on 08/10/2022 at 1:29 PM   **Disclaimer: This note may have been dictated with voice recognition software. Similar sounding words can inadvertently be transcribed and this note may contain transcription errors which may not have been corrected upon publication of note.**

## 2022-08-11 ENCOUNTER — Telehealth: Payer: Self-pay

## 2022-08-11 ENCOUNTER — Other Ambulatory Visit: Payer: Self-pay

## 2022-08-11 DIAGNOSIS — E1140 Type 2 diabetes mellitus with diabetic neuropathy, unspecified: Secondary | ICD-10-CM

## 2022-08-11 MED ORDER — PREGABALIN 25 MG PO CAPS: 25.0000 mg | ORAL_CAPSULE | Freq: Two times a day (BID) | ORAL | 2 refills | Status: DC

## 2022-08-11 NOTE — Telephone Encounter (Signed)
Please review and send which one covered

## 2022-08-14 ENCOUNTER — Other Ambulatory Visit: Payer: Self-pay | Admitting: Nurse Practitioner

## 2022-08-14 MED ORDER — PREGABALIN 25 MG PO CAPS: 25.0000 mg | ORAL_CAPSULE | Freq: Two times a day (BID) | ORAL | 2 refills | Status: DC

## 2022-08-14 NOTE — Telephone Encounter (Signed)
Sent pregabalin

## 2022-08-14 NOTE — Telephone Encounter (Signed)
Ok to fill please review send

## 2022-08-15 ENCOUNTER — Encounter: Payer: Self-pay | Admitting: Nurse Practitioner

## 2022-08-15 NOTE — Progress Notes (Signed)
 " Cardiology Office Note:    Date:  08/16/2022   ID:  Michael Gross, DOB 1955-06-03, MRN [id number]  PCP:  Liana Fish, NP   [facility] Prov[facility] Cardiologist:  Evalene Lunger, MD     Referring MD: Liana Fish, NP   CC: hospital follow up  History of Present Illness:    Michael Gross is a 67 y.o. male with a hx of atherosclerosis calcification of the aorta (seen on CXR), hypertension, paroxysmal atrial fibrillation, GERD, DM 2, BPH, HLD.  He was admitted on 07/28/2022 to 08/02/2022 with DKA in the setting of holding his antidiabetic medications for a colonoscopy.  During his hospitalization he developed a new onset of atrial fibrillation with RVR, initially treated with metoprolol  IV, IV fluids, IV amiodarone , eventually converting to normal sinus rhythm the same evening.  He had minimally elevated troponin, highest 47, was felt to be demand ischemia in the setting of DKA and tachycardia.  Echocardiogram on 07/30/2022 showed an EF of 60 to 65%, grade 1 DD, mild calcification of the aortic valve.  At discharge, he was maintaining sinus rhythm, discharged on amiodarone  200 mg twice daily x 1 week > once daily for approximately 2 months per Dr. Darron, diltiazem  120 mg daily.  CHA2DS2-VASc score of 3, however due to the brief nature of his atrial fibrillation and known trigger anticoagulation was not pursued at this time.  Recurrent episodes would require long-term anticoagulation.  He presents today accompanied by his wife for follow-up.  He states that he is feeling much better than when he was at the hospital however does not quite feel back to his normal energy level.  He has a lot of questions related to his diabetes and his recent DKA admission.  He has been evaluated by his PCP since his hospital discharge, he is considering requesting to be evaluated by an endocrinologist that his wife sees for her thyroid  condition.  He endorses 1 episode of palpitations since his  discharge, he was not bothered by them however he was aware that they were occurring. He has noticed some trace edema since discharge, although it continues to improve each day. He denies chest pain, palpitations, dyspnea, pnd, orthopnea, n, v, dizziness, syncope, weight gain, or early satiety.   Past Medical History:  Diagnosis Date   Aortic atherosclerosis 07/2022   noted on CXR   Arthritis    ankles   Basal cell carcinoma    BPH (benign prostatic hyperplasia)    Complication of anesthesia    slow to wake   Diabetes    a. diagnosed in his 40's.   History of echocardiogram    a. 11/2019 Echo: EF 60-65%, no rwma, nl RV fxn, no significant valvular abnormality.   Hyperlipidemia    PAF (paroxysmal atrial fibrillation)    a. 07/2022 noted in the setting of DKA - brief.  CHA2DS2VASc = 3-->OAC deferred for now.   Vertigo    none for over 15 years    Past Surgical History:  Procedure Laterality Date   COLONOSCOPY WITH PROPOFOL  N/A 07/27/2022   Procedure: COLONOSCOPY WITH PROPOFOL ;  Surgeon: Unk Corinn Skiff, MD;  Location: [facility];  Service: Endoscopy;  Laterality: N/A;  Diabetic   HERNIA REPAIR     2003   KNEE SURGERY     1998   MOHS SURGERY     2003   SHOULDER ARTHROSCOPY WITH OPEN ROTATOR CUFF REPAIR AND DISTAL CLAVICLE ACROMINECTOMY Left 08/11/2021   Procedure: SHOULDER ARTHROSCOPY  WITH OPEN ROTATOR CUFF REPAIR AND DISTAL CLAVICLE ACROMINECTOMY;  Surgeon: Krasinski, Kevin, MD;  Location: [facility];  Service: Orthopedics;  Laterality: Left;    Current Medications: Current Meds  Medication Sig   amiodarone  (PACERONE ) 200 MG tablet Take 1 tablet (200 mg total) by mouth 2 (two) times daily.   Ascorbic Acid (VITAMIN C PO) Take by mouth.   Cholecalciferol (VITAMIN D3 PO) Take by mouth.   Cyanocobalamin  (VITAMIN B-12 PO) Take by mouth.   diltiazem  (CARDIZEM  CD) 120 MG 24 hr capsule Take 1 capsule (120 mg total) by mouth daily.   empagliflozin  (JARDIANCE ) 25 MG TABS tablet  Take 1 tablet (25 mg total) by mouth daily.   ezetimibe  (ZETIA ) 10 MG tablet Take 1 tablet (10 mg total) by mouth daily.   insulin  glargine (LANTUS  SOLOSTAR) 100 UNIT/ML Solostar Pen Inject 20 Units into the skin at bedtime.   insulin  lispro (HUMALOG  KWIKPEN) 100 UNIT/ML KwikPen Inject insulin  dose into the skin before meals per sliding scale. Max dose per 24 hours: 42 units.   Insulin  Pen Needle (BD PEN NEEDLE NANO U/F) 32G X 4 MM MISC Use as directed with SOLIQUA  to inject insulin  every morning E11.65   Lancets 30G MISC Use to check blood sugars 5 times per day   Multiple Vitamin (MULTIVITAMIN WITH MINERALS) TABS tablet Take 1 tablet by mouth daily.   pantoprazole  (PROTONIX ) 40 MG tablet Take 1 tablet (40 mg total) by mouth 2 (two) times daily before a meal.   pregabalin  (LYRICA ) 25 MG capsule Take 1 capsule (25 mg total) by mouth 2 (two) times daily.   rosuvastatin  (CRESTOR ) 40 MG tablet TAKE 1 TABLET BY MOUTH EVERY DAY   tamsulosin  (FLOMAX ) 0.4 MG CAPS capsule Take 2 capsules (0.8 mg total) by mouth daily.     Allergies:   Patient has no known allergies.   Social History   Socioeconomic History   Marital status: Married    Spouse name: [name]   Number of children: 4   Years of education: Not on file   Highest education level: Not on file  Occupational History   Not on file  Tobacco Use   Smoking status: Never   Smokeless tobacco: Never  Vaping Use   Vaping Use: Never used  Substance and Sexual Activity   Alcohol use: Yes    Comment: rare   Drug use: No   Sexual activity: Yes  Other Topics Concern   Not on file  Social History Narrative   Not on file   Social Determinants of Health   Financial Resource Strain: Not on file  Food Insecurity: No Food Insecurity (08/01/2022)   Hunger Vital Sign    Worried About Running Out of Food in the Last Year: Never true    Ran Out of Food in the Last Year: Never true  Transportation Needs: No Transportation Needs (08/01/2022)    PRAPARE - Administrator, Civil Service (Medical): No    Lack of Transportation (Non-Medical): No  Physical Activity: Not on file  Stress: Not on file  Social Connections: Not on file     Family History: The patient's family history includes Heart disease in his father. There is no history of Prostate cancer, Kidney cancer, or Bladder Cancer.  ROS:   Please see the history of present illness.    All other systems reviewed and are negative.  EKGs/Labs/Other Studies Reviewed:    The following studies were reviewed today:  Cardiac Studies & Procedures  ECHOCARDIOGRAM  ECHOCARDIOGRAM COMPLETE 07/30/2022  Narrative ECHOCARDIOGRAM REPORT    Patient Name:   IZAAN KINGBIRD Date of Exam: 07/30/2022 Medical Rec #:  [id number]          Height:       75.0 in Accession #:    [id number]         Weight:       189.4 lb Date of Birth:  Sep 13, 1955          BSA:          2.144 m Patient Age:    66 years           BP:           114/68 mmHg Patient Gender: M                  HR:           103 bpm. Exam Location:  [facility]  Procedure: 2D Echo  Indications:     Atrial Fibrillation I48.91  History:         Patient has prior history of Echocardiogram examinations, most recent 11/18/2019.  Sonographer:     Thedora Louder RDCS Referring Phys:  [id number] EVALENE JINNY LUNGER Diagnosing Phys: Evalene Lunger MD   Sonographer Comments: Suboptimal parasternal window and suboptimal subcostal window. Image acquisition challenging due to respiratory motion. IMPRESSIONS   1. Left ventricular ejection fraction, by estimation, is 60 to 65%. The left ventricle has normal function. The left ventricle has no regional wall motion abnormalities. Left ventricular diastolic parameters are consistent with Grade I diastolic dysfunction (impaired relaxation). 2. Right ventricular systolic function is normal. The right ventricular size is normal. There is normal pulmonary artery systolic pressure. The  estimated right ventricular systolic pressure is 29.8 mmHg. 3. The mitral valve is normal in structure. No evidence of mitral valve regurgitation. No evidence of mitral stenosis. 4. The aortic valve has an indeterminant number of cusps. There is mild calcification of the aortic valve. Aortic valve regurgitation is not visualized. Aortic valve sclerosis is present, with no evidence of aortic valve stenosis. 5. The inferior vena cava is normal in size with greater than 50% respiratory variability, suggesting right atrial pressure of 3 mmHg.  FINDINGS Left Ventricle: Left ventricular ejection fraction, by estimation, is 60 to 65%. The left ventricle has normal function. The left ventricle has no regional wall motion abnormalities. The left ventricular internal cavity size was normal in size. There is no left ventricular hypertrophy. Left ventricular diastolic parameters are consistent with Grade I diastolic dysfunction (impaired relaxation).  Right Ventricle: The right ventricular size is normal. No increase in right ventricular wall thickness. Right ventricular systolic function is normal. There is normal pulmonary artery systolic pressure. The tricuspid regurgitant velocity is 2.49 m/s, and with an assumed right atrial pressure of 5 mmHg, the estimated right ventricular systolic pressure is 29.8 mmHg.  Left Atrium: Left atrial size was normal in size.  Right Atrium: Right atrial size was normal in size.  Pericardium: There is no evidence of pericardial effusion.  Mitral Valve: The mitral valve is normal in structure. No evidence of mitral valve regurgitation. No evidence of mitral valve stenosis.  Tricuspid Valve: The tricuspid valve is normal in structure. Tricuspid valve regurgitation is mild . No evidence of tricuspid stenosis.  Aortic Valve: The aortic valve has an indeterminant number of cusps. There is mild calcification of the aortic valve. Aortic valve regurgitation is not visualized.  Aortic valve sclerosis is present,  with no evidence of aortic valve stenosis. Aortic valve mean gradient measures 11.5 mmHg. Aortic valve peak gradient measures 21.3 mmHg. Aortic valve area, by VTI measures 2.61 cm.  Pulmonic Valve: The pulmonic valve was normal in structure. Pulmonic valve regurgitation is not visualized. No evidence of pulmonic stenosis.  Aorta: The aortic root is normal in size and structure.  Venous: The inferior vena cava is normal in size with greater than 50% respiratory variability, suggesting right atrial pressure of 3 mmHg.  IAS/Shunts: No atrial level shunt detected by color flow Doppler.   LEFT VENTRICLE PLAX 2D LVIDd:         4.30 cm   Diastology LVIDs:         2.70 cm   LV e' medial:    8.05 cm/s LV PW:         1.20 cm   LV E/e' medial:  11.5 LV IVS:        0.90 cm   LV e' lateral:   12.00 cm/s LVOT diam:     2.00 cm   LV E/e' lateral: 7.7 LV SV:         98 LV SV Index:   46 LVOT Area:     3.14 cm   RIGHT VENTRICLE RV Basal diam:  3.60 cm RV S prime:     21.50 cm/s TAPSE (M-mode): 3.3 cm  LEFT ATRIUM             Index        RIGHT ATRIUM          Index LA Vol (A2C):   36.1 ml 16.84 ml/m  RA Area:     9.98 cm LA Vol (A4C):   51.4 ml 23.97 ml/m  RA Volume:   17.30 ml 8.07 ml/m LA Biplane Vol: 47.5 ml 22.15 ml/m AORTIC VALVE                     PULMONIC VALVE AV Area (Vmax):    2.45 cm      PV Vmax:       1.43 m/s AV Area (Vmean):   2.38 cm      PV Peak grad:  8.2 mmHg AV Area (VTI):     2.61 cm AV Vmax:           230.80 cm/s AV Vmean:          156.000 cm/s AV VTI:            0.377 m AV Peak Grad:      21.3 mmHg AV Mean Grad:      11.5 mmHg LVOT Vmax:         180.00 cm/s LVOT Vmean:        118.000 cm/s LVOT VTI:          0.313 m LVOT/AV VTI ratio: 0.83  AORTA Ao Root diam: 3.70 cm Ao Asc diam:  3.70 cm  MITRAL VALVE                TRICUSPID VALVE MV Area (PHT): 4.21 cm     TR Peak grad:   24.8 mmHg MV Decel Time: 180 msec      TR Vmax:        249.00 cm/s MV E velocity: 92.70 cm/s MV A velocity: 108.00 cm/s  SHUNTS MV E/A ratio:  0.86         Systemic VTI:  0.31 m Systemic Diam: 2.00 cm  Evalene Lunger MD Electronically  signed by Evalene Lunger MD Signature Date/Time: 07/30/2022/12:48:52 PM    Final              EKG:  EKG is  ordered today.  The ekg ordered today demonstrates SR, RBBB, HR 67 bpm, consistent with prior EKG tracings.   Recent Labs: 08/02/2022: Magnesium 1.9 08/16/2022: ALT 25; BUN 12; Creatinine, Ser 0.77; Hemoglobin 12.3; Platelets 206; Potassium 4.1; Sodium 139; TSH 2.555  Recent Lipid Panel    Component Value Date/Time   CHOL 83 07/29/2022 0336   CHOL 112 07/06/2022 1026   TRIG 77 07/29/2022 0336   HDL 43 07/29/2022 0336   HDL 50 07/06/2022 1026   CHOLHDL 1.9 07/29/2022 0336   VLDL 15 07/29/2022 0336   LDLCALC 25 07/29/2022 0336   LDLCALC 47 07/06/2022 1026     Risk Assessment/Calculations:    CHA2DS2-VASc Score = 4   This indicates a 4.8% annual risk of stroke. The patient's score is based upon: CHF History: 0 HTN History: 1 Diabetes History: 1 Stroke History: 0 Vascular Disease History: 1 Age Score: 1 Gender Score: 0               Physical Exam:    VS:  BP 102/64   Pulse 67   Ht 6' 3 (1.905 m)   Wt 183 lb 6.4 oz (83.2 kg)   SpO2 96%   BMI 22.92 kg/m     Wt Readings from Last 3 Encounters:  08/16/22 183 lb 6.4 oz (83.2 kg)  08/10/22 194 lb 6.4 oz (88.2 kg)  08/01/22 189 lb 6 oz (85.9 kg)     GEN:  Well nourished, well developed in no acute distress HEENT: Normal NECK: No JVD; No carotid bruits LYMPHATICS: No lymphadenopathy CARDIAC: RRR, no murmurs, rubs, gallops RESPIRATORY:  Clear to auscultation without rales, wheezing or rhonchi  ABDOMEN: Soft, non-tender, non-distended MUSCULOSKELETAL:  +1 pitting edema; No deformity  SKIN: Warm and dry NEUROLOGIC:  Alert and oriented x 3 PSYCHIATRIC:  Normal affect   ASSESSMENT:    1. Paroxysmal  atrial fibrillation   2. Essential hypertension   3. Pure hypercholesterolemia   4. On amiodarone  therapy   5. Aortic atherosclerosis   6. Hypokalemia   7. Thyroid  dysfunction   8. Type 2 diabetes mellitus with complication, with long-term current use of insulin    9. Demand ischemia    PLAN:    In order of problems listed above:  Paroxysmal atrial fibrillation/amiodarone  therapy - in sinus rhythm today. He has had one brief episode of palpitations since discharge. CHA2DS2-VASc Score = 4 , however episode was brief and he was not started on OAC. Will arrange 14 day Zio for AF burden. If monitor reveals recurrence of AF, will need to be on OAC long-term.  Continue Amiodarone  200 mg daily, continue diltiazem  120 mg daily.  Repeat CMET, TSH, FT4.   Thyroid  dysfunction - TSH 0.320 in the setting of DKA. Will repeat TFT. He is going to reach out to his PCP about a referral to endocrinology for his DM.   Hypertension - BP today is 102/64, well controlled.   Aortic atherosclerosis - incidentally noted on chest x-ray during recent hospitalization, start aspirin  81 mg daily, continue Zetia  10 mg daily, continue Crestor  40 mg daily. Heart healthy diet and regular cardiovascular exercise encouraged.    Demand ischemia - troponin were mildly elevated during recent hospitalization felt to be due to demand ischemia, patient without complaints of chest pain during his hospitalization. Consider ischemic  evaluation at next OV.   HLD - LDL is currently well-managed at 25 on 07/29/2022.  Continue rosuvastatin  40 mg daily, Zetia  10 mg daily.  Hypokalemia - potassium was 3.1 on day of discharge 08/02/2022, repeat CMET today.  DM2 - diagnosed in his 85's. Antidiabetic medications were held surrounding recent colonoscopy > DKA. Most recent A1C is very elevated at 11.3%. Suggest he speak with his PCP about referring him to an endocrinologist.   Disposition - Start ASA 81 mg daily, Zio monitor for 14 days for AF  burden, repeat CMET, TFT, CBC. Return in 6 weeks.        Medication Adjustments/Labs and Tests Ordered: Current medicines are reviewed at length with the patient today.  Concerns regarding medicines are outlined above.  Orders Placed This Encounter  Procedures   CBC   Comp Met (CMET)   TSH   T4, free   LONG TERM MONITOR (3-14 DAYS)   EKG 12-Lead   No orders of the defined types were placed in this encounter.   Patient Instructions  Medication Instructions:  Your physician recommends that you continue on your current medications as directed. Please refer to the Current Medication list given to you today.  *If you need a refill on your cardiac medications before your next appointment, please call your pharmacy*   Lab Work: Your physician recommends that you get lab work today: CBC, CMP, TSH, & Free T4 Medical Mall Entrance at [facility] 1st desk on the right to check in (REGISTRATION)  Lab hours: Monday- Friday (7:30 am- 5:30 pm)  If you have labs (blood work) drawn today and your tests are completely normal, you will receive your results only by: MyChart Message (if you have MyChart) OR A paper copy in the mail If you have any lab test that is abnormal or we need to change your treatment, we will call you to review the results.   Testing/Procedures: Heart Monitor:  Length of Wear: 14 days  Your monitor will be mailed to your home address within 3-5 business days. However, if you have not received your monitor after 5 business days please send us  a MyChart message or call the office at [phone], so we may follow up on this for you.   Your physician has recommended that you wear a Zio heart monitor.   This monitor is a medical device that records the hearts electrical activity. Doctors most often use these monitors to diagnose arrhythmias. Arrhythmias are problems with the speed or rhythm of the heartbeat. The monitor is a small device applied to your chest. You can wear  one while you do your normal daily activities. While wearing this monitor if you have any symptoms to push the button and record what you felt. Once you have worn this monitor for the period of time provider prescribed (Usually 14 days), you will return the monitor device in the postage paid box. Once it is returned they will download the data collected and provide us  with a report which the provider will then review and we will call you with those results. Important tips:  Avoid showering during the first 24 hours of wearing the monitor. Avoid excessive sweating to help maximize wear time. Do not submerge the device, no hot tubs, and no swimming pools. Keep any lotions or oils away from the patch. After 24 hours you may shower with the patch on. Take brief showers with your back facing the shower head.  Do not remove patch once it  has been placed because that will interrupt data and decrease adhesive wear time. Push the button when you have any symptoms and write down what you were feeling. Once you have completed wearing your monitor, remove and place into box which has postage paid and place in your outgoing mailbox.  If for some reason you have misplaced your box then call our office and we can provide another box and/or mail it off for you.       Follow-Up: At [facility], you and your health needs are our priority.  As part of our continuing mission to provide you with exceptional heart care, we have created designated Provider Care Teams.  These Care Teams include your primary Cardiologist (physician) and Advanced Practice Providers (APPs -  Physician Assistants and Nurse Practitioners) who all work together to provide you with the care you need, when you need it.  We recommend signing up for the patient portal called MyChart.  Sign up information is provided on this After Visit Summary.  MyChart is used to connect with patients for Virtual Visits (Telemedicine).  Patients are  able to view lab/test results, encounter notes, upcoming appointments, etc.  Non-urgent messages can be sent to your provider as well.   To learn more about what you can do with MyChart, go to [email or web address].    Your next appointment:   6 week(s)  Provider:   You may see Timothy Gollan, MD or one of the following Advanced Practice Providers on your designated Care Team:   Lonni Meager, NP Bernardino Bring, PA-C Cadence Franchester, PA-C Tylene Lunch, NP   Other Instructions -None    Signed, Delon JAYSON Hoover, NP  08/16/2022 1:09 PM    [facility] "

## 2022-08-16 ENCOUNTER — Telehealth: Payer: Self-pay

## 2022-08-16 ENCOUNTER — Ambulatory Visit: Payer: Medicare Other

## 2022-08-16 ENCOUNTER — Ambulatory Visit: Payer: Medicare Other | Attending: Nurse Practitioner | Admitting: Cardiology

## 2022-08-16 ENCOUNTER — Other Ambulatory Visit
Admission: RE | Admit: 2022-08-16 | Discharge: 2022-08-16 | Disposition: A | Discharge status: Home / Self Care | Payer: Medicare Other | Source: Ambulatory Visit | Attending: Nurse Practitioner | Admitting: Nurse Practitioner

## 2022-08-16 ENCOUNTER — Inpatient Hospital Stay: Payer: Medicare Other | Admitting: Nurse Practitioner

## 2022-08-16 ENCOUNTER — Encounter: Payer: Self-pay | Admitting: Cardiology

## 2022-08-16 VITALS — BP 102/64 | HR 67 | Ht 75.0 in | Wt 183.4 lb

## 2022-08-16 DIAGNOSIS — Z79899 Other long term (current) drug therapy: Secondary | ICD-10-CM | POA: Diagnosis not present

## 2022-08-16 DIAGNOSIS — E7800 Pure hypercholesterolemia, unspecified: Secondary | ICD-10-CM | POA: Insufficient documentation

## 2022-08-16 DIAGNOSIS — I2489 Other forms of acute ischemic heart disease: Secondary | ICD-10-CM

## 2022-08-16 DIAGNOSIS — E876 Hypokalemia: Secondary | ICD-10-CM | POA: Insufficient documentation

## 2022-08-16 DIAGNOSIS — I480 Paroxysmal atrial fibrillation: Secondary | ICD-10-CM

## 2022-08-16 DIAGNOSIS — I700 Atherosclerosis of aorta: Secondary | ICD-10-CM | POA: Insufficient documentation

## 2022-08-16 DIAGNOSIS — D6859 Other primary thrombophilia: Secondary | ICD-10-CM

## 2022-08-16 DIAGNOSIS — I10 Essential (primary) hypertension: Secondary | ICD-10-CM

## 2022-08-16 DIAGNOSIS — R7989 Other specified abnormal findings of blood chemistry: Secondary | ICD-10-CM

## 2022-08-16 DIAGNOSIS — E118 Type 2 diabetes mellitus with unspecified complications: Secondary | ICD-10-CM

## 2022-08-16 DIAGNOSIS — Z794 Long term (current) use of insulin: Secondary | ICD-10-CM

## 2022-08-16 DIAGNOSIS — E079 Disorder of thyroid, unspecified: Secondary | ICD-10-CM

## 2022-08-16 LAB — CBC
HCT: 38.3 % — ABNORMAL LOW (ref 39.0–52.0)
Hemoglobin: 12.3 g/dL — ABNORMAL LOW (ref 13.0–17.0)
MCH: 29.7 pg (ref 26.0–34.0)
MCHC: 32.1 g/dL (ref 30.0–36.0)
MCV: 92.5 fL (ref 80.0–100.0)
Platelets: 206 10*3/uL (ref 150–400)
RBC: 4.14 MIL/uL — ABNORMAL LOW (ref 4.22–5.81)
RDW: 14.4 % (ref 11.5–15.5)
WBC: 4 10*3/uL (ref 4.0–10.5)
nRBC: 0 % (ref 0.0–0.2)

## 2022-08-16 LAB — COMPREHENSIVE METABOLIC PANEL
ALT: 25 U/L (ref 0–44)
AST: 21 U/L (ref 15–41)
Albumin: 3.4 g/dL — ABNORMAL LOW (ref 3.5–5.0)
Alkaline Phosphatase: 54 U/L (ref 38–126)
Anion gap: 9 (ref 5–15)
BUN: 12 mg/dL (ref 8–23)
CO2: 24 mmol/L (ref 22–32)
Calcium: 8.9 mg/dL (ref 8.9–10.3)
Chloride: 106 mmol/L (ref 98–111)
Creatinine, Ser: 0.77 mg/dL (ref 0.61–1.24)
GFR, Estimated: >60 mL/min (ref >60)
Glucose, Bld: 165 mg/dL — ABNORMAL HIGH (ref 70–99)
Potassium: 4.1 mmol/L (ref 3.5–5.1)
Sodium: 139 mmol/L (ref 135–145)
Total Bilirubin: 0.7 mg/dL (ref 0.3–1.2)
Total Protein: 6.8 g/dL (ref 6.5–8.1)

## 2022-08-16 LAB — TSH: TSH: 2.555 u[IU]/mL (ref 0.350–4.500)

## 2022-08-16 LAB — T4, FREE: Free T4: 1.07 ng/dL (ref 0.61–1.12)

## 2022-08-16 NOTE — Patient Instructions (Signed)
Medication Instructions:  Your physician recommends that you continue on your current medications as directed. Please refer to the Current Medication list given to you today.  *If you need a refill on your cardiac medications before your next appointment, please call your pharmacy*   Lab Work: Your physician recommends that you get lab work today: CBC, CMP, TSH, & Free T4 Medical Mall Entrance at [facility] 1st desk on the right to check in (REGISTRATION)  Lab hours: Monday- Friday (7:30 am- 5:30 pm)  If you have labs (blood work) drawn today and your tests are completely normal, you will receive your results only by: MyChart Message (if you have MyChart) OR A paper copy in the mail If you have any lab test that is abnormal or we need to change your treatment, we will call you to review the results.   Testing/Procedures: Heart Monitor:  Length of Wear: 14 days  Your monitor will be mailed to your home address within 3-5 business days. However, if you have not received your monitor after 5 business days please send [place] a MyChart message or call the office at [phone], so we may follow up on this for you.   Your physician has recommended that you wear a Zio heart monitor.   This monitor is a medical device that records the heart's electrical activity. Doctors most often use these monitors to diagnose arrhythmias. Arrhythmias are problems with the speed or rhythm of the heartbeat. The monitor is a small device applied to your chest. You can wear one while you do your normal daily activities. While wearing this monitor if you have any symptoms to push the button and record what you felt. Once you have worn this monitor for the period of time provider prescribed (Usually 14 days), you will return the monitor device in the postage paid box. Once it is returned they will download the data collected and provide [place] with a report which the provider will then review and we will call you with those  results. Important tips:  Avoid showering during the first 24 hours of wearing the monitor. Avoid excessive sweating to help maximize wear time. Do not submerge the device, no hot tubs, and no swimming pools. Keep any lotions or oils away from the patch. After 24 hours you may shower with the patch on. Take brief showers with your back facing the shower head.  Do not remove patch once it has been placed because that will interrupt data and decrease adhesive wear time. Push the button when you have any symptoms and write down what you were feeling. Once you have completed wearing your monitor, remove and place into box which has postage paid and place in your outgoing mailbox.  If for some reason you have misplaced your box then call our office and we can provide another box and/or mail it off for you.       Follow-Up: At [facility], you and your health needs are our priority.  As part of our continuing mission to provide you with exceptional heart care, we have created designated Provider Care Teams.  These Care Teams include your primary Cardiologist (physician) and Advanced Practice Providers (APPs -  Physician Assistants and Nurse Practitioners) who all work together to provide you with the care you need, when you need it.  We recommend signing up for the patient portal called "MyChart".  Sign up information is provided on this After Visit Summary.  MyChart is used to connect with  patients for Virtual Visits (Telemedicine).  Patients are able to view lab/test results, encounter notes, upcoming appointments, etc.  Non-urgent messages can be sent to your provider as well.   To learn more about what you can do with MyChart, go to [email or web address].    Your next appointment:   6 week(s)  Provider:   You may see [name], MD or one of the following Advanced Practice Providers on your designated Care Team:   Nicolasa Ducking, NP Eula Listen, PA-C Cadence Fransico Michael,  PA-C Charlsie Quest, NP   Other Instructions -None

## 2022-08-20 NOTE — Telephone Encounter (Signed)
Patient had 1 slightly low TSH level. Cardiologist requesting to have PCP refer patient to endocrinology. The TSH level was checked while patient was in the hospital on an amiodarone drip which can falsely lower the TSH level. Prior TSH and free T4 levels have all been within normal limits.  Additional labs are necessary for further evaluation to determine if an endocrinology referral is warranted.  If abnormal, abnormal thyroid can also be treated in primary care with more difficult cases being referred to endocrinology.  Patient does have diabetes and was previously trying to avoid starting insulin. This recent hospital stay warranted insulin therapy is needed. Currently I am adjusting insulin doses and working with patient to control his glucose levels. Endocrinology referral is not necessary for diabetes unless he is having resistant blood glucose levels or his levels are difficult to control.

## 2022-08-22 NOTE — Addendum Note (Signed)
Addended by: Lyndon Code on: 08/22/2022 02:37 PM   Modules accepted: Orders

## 2022-08-23 ENCOUNTER — Ambulatory Visit: Payer: Medicare Other | Admitting: Urology

## 2022-08-23 ENCOUNTER — Encounter: Payer: Self-pay | Admitting: Urology

## 2022-08-23 VITALS — BP 102/61 | HR 63 | Ht 75.0 in | Wt 186.0 lb

## 2022-08-23 DIAGNOSIS — N401 Enlarged prostate with lower urinary tract symptoms: Secondary | ICD-10-CM | POA: Diagnosis not present

## 2022-08-23 DIAGNOSIS — R338 Other retention of urine: Secondary | ICD-10-CM | POA: Diagnosis not present

## 2022-08-23 LAB — BLADDER SCAN AMB NON-IMAGING: Cannabinoids SerPlBld Ql Scn: >608

## 2022-08-23 NOTE — Progress Notes (Signed)
I, Michael Gross,acting as a scribe for Michael Altes, MD.,have documented all relevant documentation on the behalf of Michael Altes, MD,as directed by  Michael Altes, MD while in the presence of Michael Altes, MD.   08/23/22 11:50 AM   Michael Gross [id number]  Referring provider: Sallyanne Kuster, NP [address] [place],  [place] [address]  Chief Complaint  Patient presents with   Benign Prostatic Hypertrophy    HPI: Michael Gross is a 67 y.o. male who presents for one month follow-up visit.   Seen 07/19/22 with 2 year history of bothersome lower urinary tract symptoms; IPSS 22/35. PVR was 466 mL. Had been on Tmasulosin 0.8 mg daily and was given a trial of Silodosin 8 mg daily No significant improvement in his voiding symptoms on Silodosin  PVR today >608 cc's   PMH: Past Medical History:  Diagnosis Date   Aortic atherosclerosis 07/2022   noted on CXR   Arthritis    ankles   Basal cell carcinoma    BPH (benign prostatic hyperplasia)    Complication of anesthesia    slow to wake   Diabetes    a. diagnosed in his 40's.   History of echocardiogram    a. 11/2019 Echo: EF 60-65%, no rwma, nl RV fxn, no significant valvular abnormality.   Hyperlipidemia    PAF (paroxysmal atrial fibrillation)    a. 07/2022 noted in the setting of DKA - brief.  CHA2DS2VASc = 3-->OAC deferred for now.   Vertigo    none for over 15 years    Surgical History: Past Surgical History:  Procedure Laterality Date   COLONOSCOPY WITH PROPOFOL N/A 07/27/2022   Procedure: COLONOSCOPY WITH PROPOFOL;  Surgeon: Toney Reil, MD;  Location: [facility];  Service: Endoscopy;  Laterality: N/A;  Diabetic   HERNIA REPAIR     2003   KNEE SURGERY     1998   MOHS SURGERY     2003   SHOULDER ARTHROSCOPY WITH OPEN ROTATOR CUFF REPAIR AND DISTAL CLAVICLE ACROMINECTOMY Left 08/11/2021   Procedure: SHOULDER ARTHROSCOPY WITH OPEN ROTATOR CUFF REPAIR AND DISTAL  CLAVICLE ACROMINECTOMY;  Surgeon: Juanell Fairly, MD;  Location: [facility];  Service: Orthopedics;  Laterality: Left;    Home Medications:  Allergies as of 08/23/2022   No Known Allergies      Medication List        Accurate as of August 23, 2022 11:50 AM. If you have any questions, ask your nurse or doctor.          STOP taking these medications    mupirocin ointment 2 % Commonly known as: BACTROBAN Stopped by: Michael Altes, MD   potassium chloride 10 MEQ tablet Commonly known as: KLOR-CON M Stopped by: Michael Altes, MD   silodosin 8 MG Caps capsule Commonly known as: RAPAFLO Stopped by: Michael Altes, MD   sucralfate 1 g tablet Commonly known as: Carafate Stopped by: Michael Altes, MD   triamcinolone cream 0.1 % Commonly known as: KENALOG Stopped by: Michael Altes, MD       TAKE these medications    amiodarone 200 MG tablet Commonly known as: PACERONE Take 1 tablet (200 mg total) by mouth 2 (two) times daily.   aspirin EC 81 MG tablet Take 1 tablet (81 mg total) by mouth daily. Swallow whole.   BD Pen Needle Nano U/F 32G X 4 MM Misc Generic drug: Insulin Pen Needle Use  as directed with SOLIQUA to inject insulin every morning E11.65   diltiazem 120 MG 24 hr capsule Commonly known as: CARDIZEM CD Take 1 capsule (120 mg total) by mouth daily.   empagliflozin 25 MG Tabs tablet Commonly known as: Jardiance Take 1 tablet (25 mg total) by mouth daily.   ezetimibe 10 MG tablet Commonly known as: Zetia Take 1 tablet (10 mg total) by mouth daily.   insulin lispro 100 UNIT/ML KwikPen Commonly known as: HumaLOG KwikPen Inject insulin dose into the skin before meals per sliding scale. Max dose per 24 hours: 42 units.   Lancets 30G Misc Use to check blood sugars 5 times per day   Lantus SoloStar 100 UNIT/ML Solostar Pen Generic drug: insulin glargine Inject 20 Units into the skin Gross bedtime.   multivitamin with minerals Tabs tablet Take  1 tablet by mouth daily.   pantoprazole 40 MG tablet Commonly known as: PROTONIX Take 1 tablet (40 mg total) by mouth 2 (two) times daily before a meal.   pregabalin 25 MG capsule Commonly known as: LYRICA Take 1 capsule (25 mg total) by mouth 2 (two) times daily.   rosuvastatin 40 MG tablet Commonly known as: CRESTOR TAKE 1 TABLET BY MOUTH EVERY DAY   tamsulosin 0.4 MG Caps capsule Commonly known as: FLOMAX Take 2 capsules (0.8 mg total) by mouth daily.   VITAMIN B-12 PO Take by mouth.   VITAMIN C PO Take by mouth.   VITAMIN D3 PO Take by mouth.        Allergies: No Known Allergies  Family History: Family History  Problem Relation Age of Onset   Heart disease Father    Prostate cancer Neg Hx    Kidney cancer Neg Hx    Bladder Cancer Neg Hx     Social History:  reports that he has never smoked. He has never used smokeless tobacco. He reports current alcohol use. He reports that he does not use drugs.   Physical Exam: BP 102/61   Pulse 63   Ht  (1.905 m)   Wt 186 lb (84.4 kg)   BMI 23.25 kg/m   Constitutional:  Alert and oriented, No acute distress. HEENT: Michael Gross, moist mucus membranes.  Trachea midline, no masses. Cardiovascular: No clubbing, cyanosis, or edema. Respiratory: Normal respiratory effort, no increased work of breathing. GI: Abdomen is soft, nontender, nondistended, no abdominal masses Skin: No rashes, bruises or suspicious lesions. Neurologic: Grossly intact, no focal deficits, moving all 4 extremities. Psychiatric: Normal mood and affect.  Laboratory Data: Lab Results  Component Value Date   WBC 4.0 08/16/2022   HGB 12.3 (L) 08/16/2022   HCT 38.3 (L) 08/16/2022   MCV 92.5 08/16/2022   PLT 206 08/16/2022    Lab Results  Component Value Date   CREATININE 0.77 08/16/2022    Lab Results  Component Value Date   TESTOSTERONE 373 07/28/2016    Lab Results  Component Value Date   HGBA1C 11.3 (H) 07/28/2022    Urinalysis     Component Value Date/Time   COLORURINE STRAW (A) 07/28/2022 1409   APPEARANCEUR CLEAR (A) 07/28/2022 1409   APPEARANCEUR Clear 07/19/2022 1127   LABSPEC 1.017 07/28/2022 1409   PHURINE 5.0 07/28/2022 1409   GLUCOSEU >=500 (A) 07/28/2022 1409   GLUCOSEU NEGATIVE 01/13/2013 1557   HGBUR LARGE (A) 07/28/2022 1409   BILIRUBINUR NEGATIVE 07/28/2022 1409   BILIRUBINUR Negative 07/19/2022 1127   KETONESUR 80 (A) 07/28/2022 1409   PROTEINUR 30 (A) 07/28/2022  1409   UROBILINOGEN 1.0 01/13/2013 1557   NITRITE NEGATIVE 07/28/2022 1409   LEUKOCYTESUR NEGATIVE 07/28/2022 1409    Lab Results  Component Value Date   LABMICR See below: 07/19/2022   WBCUA 0-5 07/19/2022   RBCUA None seen 11/19/2017   LABEPIT 0-10 07/19/2022   MUCUS Present 11/21/2018   BACTERIA RARE (A) 07/28/2022     Assessment & Plan:    BPH with urinary retention Recommend foley catheter placement x 1 week with follow-up voiding trial next week No longer taking Silodosin Recommend he restart Tamsulosin 0.8 mg daily while the catheter is indwelling If he fails voiding trial we will schedule cystoscopy/TRUS prostate for volume.  [facility] [address], [address] [place], [place] [address] [phone]

## 2022-08-23 NOTE — Progress Notes (Unsigned)
Simple Catheter Placement  Due to urinary retention patient is present today for a foley cath placement.  Patient was cleaned and prepped in a sterile fashion with betadine and 2% lidocaine jelly was instilled into the urethra. A 16 coude FR foley catheter was inserted, urine return was noted  , urine was yellow in color.  The balloon was filled with 10cc of sterile water.  A leg bag was attached for drainage. Patient was also given a night bag to take home and was given instruction on how to change from one bag to another.  Patient was given instruction on proper catheter care.  Patient tolerated well, no complications were noted   Performed by: Teressa Lower, CMA  Additional notes/ Follow up: 1 week voiding trial

## 2022-08-31 ENCOUNTER — Encounter: Payer: Self-pay | Admitting: Nurse Practitioner

## 2022-08-31 ENCOUNTER — Ambulatory Visit: Payer: Medicare Other | Admitting: Physician Assistant

## 2022-08-31 ENCOUNTER — Ambulatory Visit (INDEPENDENT_AMBULATORY_CARE_PROVIDER_SITE_OTHER): Payer: Medicare Other | Admitting: Nurse Practitioner

## 2022-08-31 VITALS — BP 120/66 | HR 63 | Temp 98.5°F | Resp 16 | Ht 75.0 in | Wt 198.4 lb

## 2022-08-31 VITALS — BP 120/70 | HR 71 | Ht 75.0 in | Wt 198.1 lb

## 2022-08-31 DIAGNOSIS — E1165 Type 2 diabetes mellitus with hyperglycemia: Secondary | ICD-10-CM

## 2022-08-31 DIAGNOSIS — R338 Other retention of urine: Secondary | ICD-10-CM

## 2022-08-31 DIAGNOSIS — R7989 Other specified abnormal findings of blood chemistry: Secondary | ICD-10-CM

## 2022-08-31 DIAGNOSIS — N401 Enlarged prostate with lower urinary tract symptoms: Secondary | ICD-10-CM | POA: Diagnosis not present

## 2022-08-31 DIAGNOSIS — E1140 Type 2 diabetes mellitus with diabetic neuropathy, unspecified: Secondary | ICD-10-CM | POA: Diagnosis not present

## 2022-08-31 DIAGNOSIS — I480 Paroxysmal atrial fibrillation: Secondary | ICD-10-CM | POA: Diagnosis not present

## 2022-08-31 LAB — BLADDER SCAN AMB NON-IMAGING: Scan Result: 313 mL

## 2022-08-31 MED ORDER — SULFAMETHOXAZOLE-TRIMETHOPRIM 800-160 MG PO TABS
1.0000 | ORAL_TABLET | Freq: Two times a day (BID) | ORAL | 0 refills | Status: DC
Start: 2022-08-31 — End: 2022-09-15

## 2022-08-31 NOTE — Progress Notes (Signed)
[facility] [address] [place], [place] [address]  Internal MEDICINE  Office Visit Note  Patient Name: Michael Gross  [id number]  [id number]  Date of Service: 08/31/2022  Chief Complaint  Patient presents with   Diabetes   Hyperlipidemia   Follow-up    Insulin dose adjustment    HPI Jorgen presents for a follow-up visit for diabetes  Most recent A1c was 11.3, due to repeat in june Discussed Insulin dose adjustment -- provided patient with sliding scale at previous office visit and plan for basal insulin.  Was on ozempic before and jardiance, tried [place], did not consistently stay on ozempic in the past somewhat due to insurance issues.  Glucose readings at home have been significantly improved with diet changes and adding insulin regimen since being discharged from the hospital.     Current Medication: Outpatient Encounter Medications as of 08/31/2022  Medication Sig   amiodarone (PACERONE) 200 MG tablet Take 1 tablet (200 mg total) by mouth 2 (two) times daily.   Ascorbic Acid (VITAMIN C PO) Take by mouth.   aspirin EC 81 MG tablet Take 1 tablet (81 mg total) by mouth daily. Swallow whole.   Cholecalciferol (VITAMIN D3 PO) Take by mouth.   Cyanocobalamin (VITAMIN B-12 PO) Take by mouth.   diltiazem (CARDIZEM CD) 120 MG 24 hr capsule Take 1 capsule (120 mg total) by mouth daily.   empagliflozin (JARDIANCE) 25 MG TABS tablet Take 1 tablet (25 mg total) by mouth daily.   ezetimibe (ZETIA) 10 MG tablet Take 1 tablet (10 mg total) by mouth daily.   insulin glargine (LANTUS SOLOSTAR) 100 UNIT/ML Solostar Pen Inject 20 Units into the skin at bedtime.   insulin lispro (HUMALOG KWIKPEN) 100 UNIT/ML KwikPen Inject insulin dose into the skin before meals per sliding scale. Max dose per 24 hours: 42 units.   Insulin Pen Needle (BD PEN NEEDLE NANO U/F) 32G X 4 MM MISC Use as directed with SOLIQUA to inject insulin every morning E11.65   Lancets 30G MISC Use to check  blood sugars 5 times per day   Multiple Vitamin (MULTIVITAMIN WITH MINERALS) TABS tablet Take 1 tablet by mouth daily.   pantoprazole (PROTONIX) 40 MG tablet Take 1 tablet (40 mg total) by mouth 2 (two) times daily before a meal.   pregabalin (LYRICA) 25 MG capsule Take 1 capsule (25 mg total) by mouth 2 (two) times daily.   rosuvastatin (CRESTOR) 40 MG tablet TAKE 1 TABLET BY MOUTH EVERY DAY   tamsulosin (FLOMAX) 0.4 MG CAPS capsule Take 2 capsules (0.8 mg total) by mouth daily.   No facility-administered encounter medications on file as of 08/31/2022.    Surgical History: Past Surgical History:  Procedure Laterality Date   COLONOSCOPY WITH PROPOFOL N/A 07/27/2022   Procedure: COLONOSCOPY WITH PROPOFOL;  Surgeon: Toney Reil, MD;  Location: [facility];  Service: Endoscopy;  Laterality: N/A;  Diabetic   HERNIA REPAIR     2003   KNEE SURGERY     1998   MOHS SURGERY     2003   SHOULDER ARTHROSCOPY WITH OPEN ROTATOR CUFF REPAIR AND DISTAL CLAVICLE ACROMINECTOMY Left 08/11/2021   Procedure: SHOULDER ARTHROSCOPY WITH OPEN ROTATOR CUFF REPAIR AND DISTAL CLAVICLE ACROMINECTOMY;  Surgeon: Juanell Fairly, MD;  Location: [facility];  Service: Orthopedics;  Laterality: Left;    Medical History: Past Medical History:  Diagnosis Date   Aortic atherosclerosis (HCC) 07/2022   noted on CXR   Arthritis  ankles   Basal cell carcinoma    BPH (benign prostatic hyperplasia)    Complication of anesthesia    slow to wake   Diabetes (HCC)    a. diagnosed in his 40's.   History of echocardiogram    a. 11/2019 Echo: EF 60-65%, no rwma, nl RV fxn, no significant valvular abnormality.   Hyperlipidemia    PAF (paroxysmal atrial fibrillation) (HCC)    a. 07/2022 noted in the setting of DKA - brief.  CHA2DS2VASc = 3-->OAC deferred for now.   Vertigo    none for over 15 years    Family History: Family History  Problem Relation Age of Onset   Heart disease Father    Prostate cancer Neg  Hx    Kidney cancer Neg Hx    Bladder Cancer Neg Hx     Social History   Socioeconomic History   Marital status: Married    Spouse name: Hilda Lias   Number of children: 4   Years of education: Not on file   Highest education level: Not on file  Occupational History   Not on file  Tobacco Use   Smoking status: Never   Smokeless tobacco: Never  Vaping Use   Vaping Use: Never used  Substance and Sexual Activity   Alcohol use: Yes    Comment: rare   Drug use: No   Sexual activity: Yes  Other Topics Concern   Not on file  Social History Narrative   Not on file   Social Determinants of Health   Financial Resource Strain: Not on file  Food Insecurity: No Food Insecurity (08/01/2022)   Hunger Vital Sign    Worried About Running Out of Food in the Last Year: Never true    Ran Out of Food in the Last Year: Never true  Transportation Needs: No Transportation Needs (08/01/2022)   PRAPARE - Administrator, Civil Service (Medical): No    Lack of Transportation (Non-Medical): No  Physical Activity: Not on file  Stress: Not on file  Social Connections: Not on file  Intimate Partner Violence: Not At Risk (08/01/2022)   Humiliation, Afraid, Rape, and Kick questionnaire    Fear of Current or Ex-Partner: No    Emotionally Abused: No    Physically Abused: No    Sexually Abused: No      Review of Systems  Constitutional:  Positive for fatigue. Negative for chills and unexpected weight change.  HENT: Negative.  Negative for congestion, postnasal drip, rhinorrhea, sneezing and sore throat.   Eyes:  Negative for redness.  Respiratory: Negative.  Negative for cough, chest tightness, shortness of breath and wheezing.   Cardiovascular: Negative.  Negative for chest pain and palpitations.  Gastrointestinal:  Negative for abdominal pain, constipation, diarrhea, nausea and vomiting.  Endocrine: Negative for polyuria.  Genitourinary:  Negative for decreased urine volume, difficulty  urinating, dysuria and frequency.  Musculoskeletal:  Positive for arthralgias and gait problem (has right foot drop). Negative for back pain, joint swelling and neck pain.  Skin:  Negative for rash.  Neurological:  Positive for tremors. Negative for numbness.  Hematological:  Negative for adenopathy. Does not bruise/bleed easily.  Psychiatric/Behavioral:  Negative for behavioral problems (Depression), sleep disturbance and suicidal ideas. The patient is not nervous/anxious.     Vital Signs: BP 120/66   Pulse 63   Temp 98.5 F (36.9 C)   Resp 16   Ht 6\' 3"  (1.905 m)   Wt 198 lb 6.4 oz (90  kg)   SpO2 98%   BMI 24.80 kg/m    Physical Exam Vitals reviewed.  Constitutional:      General: He is not in acute distress.    Appearance: Normal appearance. He is normal weight. He is not ill-appearing.  HENT:     Head: Normocephalic and atraumatic.  Eyes:     Pupils: Pupils are equal, round, and reactive to light.  Cardiovascular:     Rate and Rhythm: Normal rate and regular rhythm.  Pulmonary:     Effort: Pulmonary effort is normal. No respiratory distress.  Neurological:     Mental Status: He is alert and oriented to person, place, and time.  Psychiatric:        Mood and Affect: Mood normal.        Behavior: Behavior normal.        Assessment/Plan: 1. Uncontrolled type 2 diabetes mellitus with hyperglycemia (HCC) Continue current sliding scale for mealtime insulin and current dose and adjustment plan for basal insulin. Continue to record glucose readings. Encouraged patient and family member to call the clinic or after hours line with any questions or issues with insulin dosing titration/adjustments. They verbally acknowledged understanding and stated that they will call with any questions or concerns.  Repeat A1c in June.  No endocrinology consult needed at this time. Will continue to evaluate periodically if a referral is needed.   2. Paroxysmal atrial fibrillation  (HCC) Continue amiodarone and diltiazem.   3. Abnormal TSH Low TSH in hospital while on amiodarone drip. Additional labs ordered to determine if this was artifical or a significant thyroid problem. No referral for endocrinology needed at this time. Pending labs, will reevaluate.  - T3, free - TSH + free T4 - Thyroid peroxidase antibody - Thyroglobulin antibody  4. Neuropathy due to type 2 diabetes mellitus (HCC) Continue gabapentin.    General Counseling: erdman roher understanding of the findings of todays visit and agrees with plan of treatment. I have discussed any further diagnostic evaluation that may be needed or ordered today. We also reviewed his medications today. he has been encouraged to call the office with any questions or concerns that should arise related to todays visit.    Orders Placed This Encounter  Procedures   T3, free   TSH + free T4   Thyroid peroxidase antibody   Thyroglobulin antibody    No orders of the defined types were placed in this encounter.   Return for previously scheduled, F/U, Recheck A1C, Andreya Lacks PCP in june, please call office with questions. .   Total time spent:30 Minutes Time spent includes review of chart, medications, test results, and follow up plan with the patient.   [place] Controlled Substance Database was reviewed by me.  This patient was seen by Sallyanne Kuster, FNP-C in collaboration with Dr. Beverely Risen as a part of collaborative care agreement.   Lynnet Hefley R. Tedd Sias, MSN, FNP-C Internal medicine

## 2022-08-31 NOTE — Progress Notes (Signed)
08/31/2022 11:46 AM   [name] 04/07/1956 [id number]  CC: Chief Complaint  Patient presents with   voiding trial   HPI: Michael Gross is a 67 y.o. male with PMH BPH with urinary retention requiring Foley catheter placement with Dr. Lonna Cobb 8 days ago on Flomax 0.8 mg daily who presents today for voiding trial.  He is accompanied today by his wife, who contributes to HPI.  Foley catheter removed in the morning, see separate procedure note for details.  He returned to clinic in the afternoon for PVR.  He reports drinking approximately 20 ounces of fluid.  He has voided multiple times without difficulty and denies abdominal pain or discomfort.  PVR , previously >619mL.  PMH: Past Medical History:  Diagnosis Date   Aortic atherosclerosis 07/2022   noted on CXR   Arthritis    ankles   Basal cell carcinoma    BPH (benign prostatic hyperplasia)    Complication of anesthesia    slow to wake   Diabetes    a. diagnosed in his 40's.   History of echocardiogram    a. 11/2019 Echo: EF 60-65%, no rwma, nl RV fxn, no significant valvular abnormality.   Hyperlipidemia    PAF (paroxysmal atrial fibrillation)    a. 07/2022 noted in the setting of DKA - brief.  CHA2DS2VASc = 3-->OAC deferred for now.   Vertigo    none for over 15 years    Surgical History: Past Surgical History:  Procedure Laterality Date   COLONOSCOPY WITH PROPOFOL N/A 07/27/2022   Procedure: COLONOSCOPY WITH PROPOFOL;  Surgeon: Toney Reil, MD;  Location: [facility];  Service: Endoscopy;  Laterality: N/A;  Diabetic   HERNIA REPAIR     2003   KNEE SURGERY     1998   MOHS SURGERY     2003   SHOULDER ARTHROSCOPY WITH OPEN ROTATOR CUFF REPAIR AND DISTAL CLAVICLE ACROMINECTOMY Left 08/11/2021   Procedure: SHOULDER ARTHROSCOPY WITH OPEN ROTATOR CUFF REPAIR AND DISTAL CLAVICLE ACROMINECTOMY;  Surgeon: Juanell Fairly, MD;  Location: [facility];  Service: Orthopedics;  Laterality: Left;     Home Medications:  Allergies as of 08/31/2022   No Known Allergies      Medication List        Accurate as of August 31, 2022 11:46 AM. If you have any questions, ask your nurse or doctor.          amiodarone 200 MG tablet Commonly known as: PACERONE Take 1 tablet (200 mg total) by mouth 2 (two) times daily.   aspirin EC 81 MG tablet Take 1 tablet (81 mg total) by mouth daily. Swallow whole.   BD Pen Needle Nano U/F 32G X 4 MM Misc Generic drug: Insulin Pen Needle Use as directed with SOLIQUA to inject insulin every morning E11.65   diltiazem 120 MG 24 hr capsule Commonly known as: CARDIZEM CD Take 1 capsule (120 mg total) by mouth daily.   empagliflozin 25 MG Tabs tablet Commonly known as: Jardiance Take 1 tablet (25 mg total) by mouth daily.   ezetimibe 10 MG tablet Commonly known as: Zetia Take 1 tablet (10 mg total) by mouth daily.   insulin lispro 100 UNIT/ML KwikPen Commonly known as: HumaLOG KwikPen Inject insulin dose into the skin before meals per sliding scale. Max dose per 24 hours: 42 units.   Lancets 30G Misc Use to check blood sugars 5 times per day   Lantus SoloStar 100 UNIT/ML Solostar Pen Generic drug:  insulin glargine Inject 20 Units into the skin at bedtime.   multivitamin with minerals Tabs tablet Take 1 tablet by mouth daily.   pantoprazole 40 MG tablet Commonly known as: PROTONIX Take 1 tablet (40 mg total) by mouth 2 (two) times daily before a meal.   pregabalin 25 MG capsule Commonly known as: LYRICA Take 1 capsule (25 mg total) by mouth 2 (two) times daily.   rosuvastatin 40 MG tablet Commonly known as: CRESTOR TAKE 1 TABLET BY MOUTH EVERY DAY   tamsulosin 0.4 MG Caps capsule Commonly known as: FLOMAX Take 2 capsules (0.8 mg total) by mouth daily.   VITAMIN B-12 PO Take by mouth.   VITAMIN C PO Take by mouth.   VITAMIN D3 PO Take by mouth.        Allergies:  No Known Allergies  Family History: Family  History  Problem Relation Age of Onset   Heart disease Father    Prostate cancer Neg Hx    Kidney cancer Neg Hx    Bladder Cancer Neg Hx     Social History:   reports that he has never smoked. He has never used smokeless tobacco. He reports current alcohol use. He reports that he does not use drugs.  Physical Exam: BP 120/70   Pulse 71   Ht  (1.905 m)   Wt 198 lb 2 oz (89.9 kg)   BMI 24.76 kg/m   Constitutional:  Alert and oriented, no acute distress, nontoxic appearing HEENT: [place], AT Cardiovascular: No clubbing, cyanosis, or edema Respiratory: Normal respiratory effort, no increased work of breathing Skin: No rashes, bruises or suspicious lesions Neurologic: Grossly intact, no focal deficits, moving all 4 extremities Psychiatric: Normal mood and affect  Laboratory Data: Results for orders placed or performed in visit on 08/31/22  BLADDER SCAN AMB NON-IMAGING  Result Value Ref Range   Scan Result 313 mL   Assessment & Plan:   1. Benign prostatic hyperplasia with urinary retention PVR remains elevated this afternoon, though not an overt retention and he is tolerating his residual well.  We discussed various options including Foley catheter replacement versus continued trial without catheter.  I recommended proceeding with cystoscopy TRUS for further evaluation of his BPH regarding this.  Since he tolerated his urinary retention previously rather well, he elected to defer Foley catheter replacement pending cystoscopy TRUS.  We discussed return precautions including abdominal pain or the inability to void.  Will treat him with 3 days of Bactrim DS twice daily for UTI prevention in the setting of recent Foley. - BLADDER SCAN AMB NON-IMAGING - sulfamethoxazole-trimethoprim (BACTRIM DS) 800-160 MG tablet; Take 1 tablet by mouth 2 (two) times daily.  Dispense: 6 tablet; Refill: 0   Return for Cysto and TRUS with Dr. Lonna Cobb.  Carman Ching, PA-C  [facility]  [place] [address] [place], [place] [address] [phone]

## 2022-08-31 NOTE — Progress Notes (Signed)
Catheter Removal  Patient is present today for a catheter removal.  8 ml of water was drained from the balloon. A 16 FR foley cath was removed from the bladder, no complications were noted. Patient tolerated well.  Performed by: Gearald Stonebraker H RMA  Follow up/ Additional notes: come back in the pm for PVR

## 2022-08-31 NOTE — Patient Instructions (Addendum)
Cystoscopy Cystoscopy is a procedure that is used to help diagnose and sometimes treat conditions that affect the lower urinary tract. The lower urinary tract includes the bladder and the urethra. The urethra is the tube that drains urine from the bladder. Cystoscopy is done using a thin, tube-shaped instrument with a light and camera at the end (cystoscope). The cystoscope may be hard or flexible, depending on the goal of the procedure. The cystoscope is inserted through the urethra, into the bladder. Cystoscopy may be recommended if you have: Urinary tract infections that keep coming back. Blood in the urine (hematuria). An inability to control when you urinate (urinary incontinence) or an overactive bladder. Unusual cells found in a urine sample. A blockage in the urethra, such as a urinary stone. Painful urination. An abnormality in the bladder found during an intravenous pyelogram (IVP) or CT scan. What are the risks? Generally, this is a safe procedure. However, problems may occur, including: Infection. Bleeding.  What happens during the procedure?  You will be given one or more of the following: A medicine to numb the area (local anesthetic). The area around the opening of your urethra will be cleaned. The cystoscope will be passed through your urethra into your bladder. Germ-free (sterile) fluid will flow through the cystoscope to fill your bladder. The fluid will stretch your bladder so that your health care provider can clearly examine your bladder walls. Your doctor will look at the urethra and bladder. The cystoscope will be removed The procedure may vary among health care providers  What can I expect after the procedure? After the procedure, it is common to have: Some soreness or pain in your urethra. Urinary symptoms. These include: Mild pain or burning when you urinate. Pain should stop within a few minutes after you urinate. This may last for up to a few days after the  procedure. A small amount of blood in your urine for several days. Feeling like you need to urinate but producing only a small amount of urine. Follow these instructions at home: General instructions Return to your normal activities as told by your health care provider.  Drink plenty of fluids after the procedure. Keep all follow-up visits as told by your health care provider. This is important. Contact a health care provider if you: Have pain that gets worse or does not get better with medicine, especially pain when you urinate lasting longer than 72 hours after the procedure. Have trouble urinating. Get help right away if you: Have blood clots in your urine. Have a fever or chills. Are unable to urinate. Summary Cystoscopy is a procedure that is used to help diagnose and sometimes treat conditions that affect the lower urinary tract. Cystoscopy is done using a thin, tube-shaped instrument with a light and camera at the end. After the procedure, it is common to have some soreness or pain in your urethra. It is normal to have blood in your urine after the procedure.  If you were prescribed an antibiotic medicine, take it as told by your health care provider.  Transrectal Ultrasound  A transrectal ultrasound is a procedure that uses sound waves to create images of the prostate gland and nearby tissues. For this procedure, an ultrasound probe is placed in the rectum. The probe sends sound waves through the wall of the rectum into the prostate gland. The prostate is a walnut-sized gland that is located below the bladder and in front of the rectum. The images show the size and shape of  the prostate gland and nearby structures. You may need this test if you have: Trouble urinating. Trouble getting your partner pregnant (infertility). An abnormal result from a prostate screening exam. Tell a health care provider about: Any allergies you have. All medicines you are taking, including vitamins,  herbs, eye drops, creams, and over-the-counter medicines. Any bleeding problems you have. Any surgeries you have had. Any medical conditions you have. Any prostate infections you have had. What are the risks? Generally, this is a safe procedure. However, problems may occur, including: Discomfort during the procedure. Blood in your urine or sperm after the procedure. This may occur if a sample of tissue is taken to look at under a microscope (biopsy) during the procedure. What happens before the procedure? Your health care provider may instruct you to use an enema 1-4 hours before the procedure. Follow instructions from your health care provider about how to do the enema. Ask your health care provider about: Changing or stopping your regular medicines. This is especially important if you are taking diabetes medicines or blood thinners. Taking medicines such as aspirin and ibuprofen. These medicines can thin your blood. Do not take these medicines unless your health care provider tells you to take them. Taking over-the-counter medicines, vitamins, herbs, and supplements. What happens during the procedure? You will be asked to lie down on your left side on an exam table. You will bend your knees toward your chest. Gel will be put on a small probe that is about the width of a finger. The probe will be gently inserted into your rectum. You may have a feeling of fullness but should not feel pain. The probe will send signals to a computer that will create images. These will be displayed on a monitor that looks like a small television screen. The technician will slightly rotate the probe throughout the procedure. While rotating the probe, he or she will view and capture images of the prostate gland and the surrounding structures from different angles. Your health care provider may take a biopsy sample of prostate tissue during the procedure. The images captured from the ultrasound will help guide the  needle that is used to remove a sample of tissue. The sample will be sent to a lab for testing. The probe will be removed. The procedure may vary among health care providers and hospitals. What can I expect after the procedure? It is up to you to get the results of your procedure. Ask your health care provider, or the department that is doing the procedure, when your results will be ready. Keep all follow-up visits. This is important. Summary A transrectal ultrasound is a procedure that uses sound waves to create images of the prostate gland and nearby tissues. The images show the size and shape of the prostate gland and nearby structures. Before the procedure, ask your health care provider about changing or stopping your regular medicines. This is especially important if you are taking diabetes medicines or blood thinners.

## 2022-09-01 LAB — THYROID PEROXIDASE ANTIBODY: Thyroperoxidase Ab SerPl-aCnc: 14 IU/mL (ref 0–34)

## 2022-09-01 LAB — TSH+FREE T4
Free T4: 1.43 ng/dL (ref 0.82–1.77)
TSH: 1.58 u[IU]/mL (ref 0.450–4.500)

## 2022-09-01 LAB — THYROGLOBULIN ANTIBODY: Thyroglobulin Antibody: <1 IU/mL (ref 0.0–0.9)

## 2022-09-01 LAB — T3, FREE: T3, Free: 2.2 pg/mL (ref 2.0–4.4)

## 2022-09-07 ENCOUNTER — Telehealth: Payer: Self-pay | Admitting: Nurse Practitioner

## 2022-09-07 NOTE — Telephone Encounter (Signed)
Left message on patient's voicemail to call back

## 2022-09-07 NOTE — Telephone Encounter (Signed)
Left a message for the patient to call back.  

## 2022-09-07 NOTE — Telephone Encounter (Signed)
Patient returned RN's call. 

## 2022-09-07 NOTE — Telephone Encounter (Signed)
Email notification received from [employer] that the patient's heart montior   (SN: [id number]) has a monitor status of : No Data Available (Device not activated).   Reviewed the patient's chart. He was seen in the office on 08/16/22 with Wallis Bamberg, NP (/ Ward Givens, NP). A ZIO XT was ordered at that visit to monitor AF burden.  Will need to call the patient to follow up on the status of the monitor to see if wore this/ if he activated it while wearing.

## 2022-09-08 NOTE — Telephone Encounter (Signed)
Attempted to call patient. No answer. Left message to call back.  Also sending a MyChart message to the patient regarding his heart monitor.

## 2022-09-09 ENCOUNTER — Encounter: Payer: Self-pay | Admitting: Nurse Practitioner

## 2022-09-09 NOTE — Progress Notes (Signed)
All thyroid labs are normal. No abnormalities were found. Will discuss further at next office visit

## 2022-09-15 ENCOUNTER — Ambulatory Visit: Payer: Medicare Other | Admitting: Urology

## 2022-09-15 ENCOUNTER — Encounter: Payer: Self-pay | Admitting: Urology

## 2022-09-15 VITALS — BP 141/73 | HR 67 | Ht 75.0 in | Wt 192.0 lb

## 2022-09-15 DIAGNOSIS — N401 Enlarged prostate with lower urinary tract symptoms: Secondary | ICD-10-CM

## 2022-09-15 LAB — URINALYSIS, COMPLETE
Bilirubin, UA: NEGATIVE
Leukocytes,UA: NEGATIVE
Nitrite, UA: NEGATIVE
Protein,UA: NEGATIVE
RBC, UA: NEGATIVE
Specific Gravity, UA: 1.015 (ref 1.005–1.030)
Urobilinogen, Ur: 0.2 mg/dL (ref 0.2–1.0)
pH, UA: 5.5 (ref 5.0–7.5)

## 2022-09-15 LAB — MICROSCOPIC EXAMINATION: Bacteria, UA: NONE SEEN

## 2022-09-15 NOTE — Progress Notes (Signed)
    09/15/22      Chief Complaint  Patient presents with   Follow-up   Urinary Retention        HPI: 67 y.o. year-old male with history of urinary retention and incomplete bladder emptying related to BPH who presents today to the office for cystoscopy and prostate sizing   Please see previous notes for details.     Blood pressure (!) 146/74, pulse 79, height 5\' 7"  (1.702 m), weight 165 lb (74.8 kg). NED. A&Ox3.   No respiratory distress   Abd soft, NT, ND Normal phallus with bilateral descended testicles     Cystoscopy Procedure Note   Patient identification was confirmed, informed consent was obtained, and patient was prepped using Betadine solution.  Lidocaine jelly was administered per urethral meatus.     Preoperative abx where received prior to procedure.       Pre-Procedure: - Inspection reveals a normal caliber ureteral meatus.   Procedure: The flexible cystoscope was introduced without difficulty - No urethral strictures/lesions are present. -Moderate lateral lobe enlargement prostate  - Normal bladder neck - Bilateral ureteral orifices identified - Bladder mucosa  reveals no ulcers, tumors, or lesions - No bladder stones - Mild-moderate trabeculation   Retroflexion shows no intravesical median lobe or mucosal abnormalities     Post-Procedure: - Patient tolerated the procedure well     Prostate transrectal ultrasound sizing   Informed consent was obtained after discussing risks/benefits of the procedure.  A time out was performed to ensure correct patient identity.   Pre-Procedure: -Transrectal probe was placed without difficulty -Transrectal Ultrasound performed revealing a 46.5 gm prostate measuring 3.35 x 4.70 x 5.65 cm (length) -No significant hypoechoic or median lobe noted      Assessment/ Plan:   BPH with incomplete bladder emptying Presently has no bothersome lower urinary tract symptoms on tamsulosin Outlet procedures were discussed  including TURP, PVP and UroLift He has elected to continue tamsulosin 56-month follow-up with PVR     [name], MD

## 2022-09-19 ENCOUNTER — Other Ambulatory Visit: Payer: Self-pay | Admitting: Nurse Practitioner

## 2022-09-19 DIAGNOSIS — I480 Paroxysmal atrial fibrillation: Secondary | ICD-10-CM

## 2022-09-19 NOTE — Telephone Encounter (Signed)
Please review and send 

## 2022-09-21 ENCOUNTER — Other Ambulatory Visit: Payer: Self-pay

## 2022-10-05 ENCOUNTER — Ambulatory Visit: Payer: Medicare Other | Attending: Nurse Practitioner | Admitting: Nurse Practitioner

## 2022-10-05 ENCOUNTER — Encounter: Payer: Self-pay | Admitting: Nurse Practitioner

## 2022-10-05 ENCOUNTER — Ambulatory Visit (INDEPENDENT_AMBULATORY_CARE_PROVIDER_SITE_OTHER): Payer: Medicare Other

## 2022-10-05 VITALS — BP 106/58 | HR 67 | Ht 75.0 in | Wt 205.2 lb

## 2022-10-05 DIAGNOSIS — E118 Type 2 diabetes mellitus with unspecified complications: Secondary | ICD-10-CM

## 2022-10-05 DIAGNOSIS — I10 Essential (primary) hypertension: Secondary | ICD-10-CM | POA: Diagnosis not present

## 2022-10-05 DIAGNOSIS — I480 Paroxysmal atrial fibrillation: Secondary | ICD-10-CM

## 2022-10-05 DIAGNOSIS — I700 Atherosclerosis of aorta: Secondary | ICD-10-CM

## 2022-10-05 DIAGNOSIS — E7800 Pure hypercholesterolemia, unspecified: Secondary | ICD-10-CM

## 2022-10-05 DIAGNOSIS — R609 Edema, unspecified: Secondary | ICD-10-CM

## 2022-10-05 DIAGNOSIS — Z794 Long term (current) use of insulin: Secondary | ICD-10-CM

## 2022-10-05 NOTE — Patient Instructions (Addendum)
Medication Instructions:  No changes *If you need a refill on your cardiac medications before your next appointment, please call your pharmacy*   Lab Work: None ordered If you have labs (blood work) drawn today and your tests are completely normal, you will receive your results only by: MyChart Message (if you have MyChart) OR A paper copy in the mail If you have any lab test that is abnormal or we need to change your treatment, we will call you to review the results.   Testing/Procedures: None ordered   Follow-Up: At [facility], you and your health needs are our priority.  As part of our continuing mission to provide you with exceptional heart care, we have created designated Provider Care Teams.  These Care Teams include your primary Cardiologist (physician) and Advanced Practice Providers (APPs -  Physician Assistants and Nurse Practitioners) who all work together to provide you with the care you need, when you need it.  We recommend signing up for the patient portal called "MyChart".  Sign up information is provided on this After Visit Summary.  MyChart is used to connect with patients for Virtual Visits (Telemedicine).  Patients are able to view lab/test results, encounter notes, upcoming appointments, etc.  Non-urgent messages can be sent to your provider as well.   To learn more about what you can do with MyChart, go to [email or web address].    Your next appointment:   2 month(s)  Provider:   You may see [name], MD or one of the following Advanced Practice Providers on your designated Care Team:   Nicolasa Ducking, NP Eula Listen, PA-C Cadence Fransico Michael, PA-C Charlsie Quest, NP    Other Instructions ZIO AT Long term monitor-Live Telemetry  Your physician has requested you wear a ZIO patch monitor for 14 days.  This is a single patch monitor. [employer] supplies one patch monitor per enrollment. Additional  stickers are not available.  Please do not apply  patch if you will be having a Nuclear Stress Test, Echocardiogram, Cardiac CT, MRI,  or Chest Xray during the period you would be wearing the monitor. The patch cannot be worn during  these tests. You cannot remove and re-apply the ZIO AT patch monitor.  Your ZIO patch monitor will be mailed 3 day USPS to your address on file. It may take 3-5 days to  receive your monitor after you have been enrolled.  Once you have received your monitor, please review the enclosed instructions. Your monitor has  already been registered assigning a specific monitor serial # to you.   Billing and Patient Assistance Program information  Michael Gross has been supplied with any insurance information on record for billing. [employer] offers a sliding scale Patient Assistance Program for patients without insurance, or whose  insurance does not completely cover the cost of the ZIO patch monitor. You must apply for the  Patient Assistance Program to qualify for the discounted rate. To apply, call [employer] at [phone],  select option 4, select option 2 , ask to apply for the Patient Assistance Program, (you can request an  interpreter if needed). [employer] will ask your household income and how many people are in your  household. [employer] will quote your out-of-pocket cost based on this information. They will also be able  to set up a 12 month interest free payment plan if needed.  Applying the monitor   Shave hair from upper left chest.  Hold the abrader disc by orange tab. Rub the abrader in 40  strokes over left upper chest as indicated in  your monitor instructions.  Clean area with 4 enclosed alcohol pads. Use all pads to ensure the area is cleaned thoroughly. Let  dry.  Apply patch as indicated in monitor instructions. Patch will be placed under collarbone on left side of  chest with arrow pointing upward.  Rub patch adhesive wings for 2 minutes. Remove the white label marked "1". Remove the white label  marked "2".  Rub patch adhesive wings for 2 additional minutes.  While looking in a mirror, press and release button in center of patch. A small green light will flash 3-4  times. This will be your only indicator that the monitor has been turned on.  Do not shower for the first 24 hours. You may shower after the first 24 hours.  Press the button if you feel a symptom. You will hear a small click. Record Date, Time and Symptom in  the Patient Log.   Starting the Gateway  In your kit there is a Audiological scientist box the size of a cellphone. This is Buyer, retail. It transmits all your  recorded data to [facility]. This box must always stay within 10 feet of you. Open the box and push the *  button. There will be a light that blinks orange and then green a few times. When the light stops  blinking, the Gateway is connected to the ZIO patch. Call [employer] at [phone] to confirm your monitor is transmitting.  Returning your monitor  Remove your patch and place it inside the Gateway. In the lower half of the Gateway there is a white  bag with prepaid postage on it. Place Gateway in bag and seal. Mail package back to [place] as soon as  possible. Your physician should have your final report approximately 7 days after you have mailed back  your monitor. Call [facility] Customer Care at [phone] if you have questions regarding your ZIO AT  patch monitor. Call them immediately if you see an orange light blinking on your monitor.  If your monitor falls off in less than 4 days, contact our Monitor department at [phone]. If your  monitor becomes loose or falls off after 4 days call [employer] at [phone] for suggestions on  securing your monitor

## 2022-10-05 NOTE — Progress Notes (Signed)
Office Visit    Patient Name: Michael Gross Date of Encounter: 10/05/2022  Primary Care Provider:  Sallyanne Kuster, NP Primary Cardiologist:  Michael Nordmann, MD  Chief Complaint    67 year old male with a history of paroxysmal atrial fibrillation, hypertension, hyperlipidemia, diabetes, DKA, GERD, BPH, and aortic atherosclerosis, presents for follow-up of atrial fibrillation.  Past Medical History    Past Medical History:  Diagnosis Date   Aortic atherosclerosis (HCC) 07/2022   noted on CXR   Arthritis    ankles   Basal cell carcinoma    BPH (benign prostatic hyperplasia)    Complication of anesthesia    slow to wake   Diabetes (HCC)    a. diagnosed in his 40's.   Diastolic dysfunction    a. 11/2019 Echo: EF 60-65%, no rwma, nl RV fxn, no significant valvular abnormality; b. 07/2022 Echo: EF 60-65%, no rwma, GrI DD, nl RV fxn, RVSP 29.16mmHg, AoV sclerosis w/o stenosis.   Hyperlipidemia    PAF (paroxysmal atrial fibrillation) (HCC)    a. 07/2022 noted in the setting of DKA - brief.  CHA2DS2VASc = 3-->OAC deferred for now.   Vertigo    none for over 15 years   Past Surgical History:  Procedure Laterality Date   COLONOSCOPY WITH PROPOFOL N/A 07/27/2022   Procedure: COLONOSCOPY WITH PROPOFOL;  Surgeon: Michael Reil, MD;  Location: [facility];  Service: Endoscopy;  Laterality: N/A;  Diabetic   HERNIA REPAIR     2003   KNEE SURGERY     1998   MOHS SURGERY     2003   SHOULDER ARTHROSCOPY WITH OPEN ROTATOR CUFF REPAIR AND DISTAL CLAVICLE ACROMINECTOMY Left 08/11/2021   Procedure: SHOULDER ARTHROSCOPY WITH OPEN ROTATOR CUFF REPAIR AND DISTAL CLAVICLE ACROMINECTOMY;  Surgeon: Michael Fairly, MD;  Location: [facility];  Service: Orthopedics;  Laterality: Left;    Allergies  No Known Allergies  History of Present Illness    67 year old male with above past medical history including paroxysmal atrial fibrillation, hypertension, hyperlipidemia, diabetes,  DKA, GERD, BPH, and aortic atherosclerosis.  He was admitted in March 2024 for DKA in the setting of holding his diabetic medications because he was pending a colonoscopy.  During hospitalization, he developed atrial fibrillation with rapid ventricular response that was treated with intravenous amiodarone with subsequent conversion to sinus rhythm.  Troponin was minimally elevated at 47, which was felt to be secondary to demand ischemia in the setting of DKA and tachycardia.  Echo showed normal LV function with grade 1 diastolic dysfunction and aortic valve sclerosis without stenosis.  Given short duration of A-fib, oral anticoagulation was deferred.  He was discharged home on amiodarone with plan to continue for 2 months.  Michael Gross was seen in cardiology clinic on August 16, 2022, at which time he was overall feeling better but felt his energy was not quite back to baseline.  She reported intermittent palpitations and was in sinus rhythm today.  A 14-day ZIO monitor was ordered to assess A-fib burden and determine long-term need for anticoagulation and antiarrhythmic therapy however, our office was notified by the company that they had not received any transmissions.  In discussing this with Michael Gross today, he says that he wore the monitor for 2 weeks and returned it in the mail on April 28.  He believes that he had activated incorrectly thus cannot explain why no data was found.  He did not have any palpitations or symptoms during the monitoring period.  He denies chest pain, dyspnea, PND, orthopnea, dizziness, syncope, or early satiety.  He he has chronic, dependent lower extremity edema.  Home Medications    Current Outpatient Medications  Medication Sig Dispense Refill   amiodarone (PACERONE) 200 MG tablet TAKE 1 TABLET BY MOUTH TWICE A DAY 180 tablet 1   Ascorbic Acid (VITAMIN C PO) Take by mouth.     aspirin EC 81 MG tablet Take 1 tablet (81 mg total) by mouth daily. Swallow whole. 30 tablet  0   Cholecalciferol (VITAMIN D3 PO) Take by mouth.     Cyanocobalamin (VITAMIN B-12 PO) Take by mouth.     diltiazem (CARDIZEM CD) 120 MG 24 hr capsule Take 1 capsule (120 mg total) by mouth daily. 30 capsule 2   empagliflozin (JARDIANCE) 25 MG TABS tablet Take 1 tablet (25 mg total) by mouth daily. 30 tablet 5   ezetimibe (ZETIA) 10 MG tablet Take 1 tablet (10 mg total) by mouth daily. 90 tablet 1   insulin glargine (LANTUS SOLOSTAR) 100 UNIT/ML Solostar Pen Inject 20 Units into the skin at bedtime. (Patient taking differently: Inject 30 Units into the skin at bedtime.) 15 mL 3   insulin lispro (HUMALOG KWIKPEN) 100 UNIT/ML KwikPen Inject insulin dose into the skin before meals per sliding scale. Max dose per 24 hours: 42 units. 15 mL 2   Insulin Pen Needle (BD PEN NEEDLE NANO U/F) 32G X 4 MM MISC Use as directed with SOLIQUA to inject insulin every morning E11.65 300 each 1   Lancets 30G MISC Use to check blood sugars 5 times per day     Multiple Vitamin (MULTIVITAMIN WITH MINERALS) TABS tablet Take 1 tablet by mouth daily.     pantoprazole (PROTONIX) 40 MG tablet Take 1 tablet (40 mg total) by mouth 2 (two) times daily before a meal. 60 tablet 3   pregabalin (LYRICA) 25 MG capsule Take 1 capsule (25 mg total) by mouth 2 (two) times daily. 60 capsule 2   rosuvastatin (CRESTOR) 40 MG tablet TAKE 1 TABLET BY MOUTH EVERY DAY 90 tablet 3   tamsulosin (FLOMAX) 0.4 MG CAPS capsule Take 2 capsules (0.8 mg total) by mouth daily. 180 capsule 1   No current facility-administered medications for this visit.     Review of Systems    Chronic dependent edema.  He denies chest pain, palpitations, PND, orthopnea, dizziness, syncope, edema, or early satiety.  All other systems reviewed and are otherwise negative except as noted above.    Physical Exam    VS:  BP (!) 106/58 (BP Location: Left Arm, Patient Position: Sitting, Cuff Size: Normal)   Pulse 67   Ht 6\' 3"  (1.905 m)   Wt 205 lb 4 oz (93.1 kg)    SpO2 98%   BMI 25.65 kg/m  , BMI Body mass index is 25.65 kg/m.     GEN: Well nourished, well developed, in no acute distress. HEENT: normal. Neck: Supple, no JVD, carotid bruits, or masses. Cardiac: RRR, no murmurs, rubs, or gallops. No clubbing, cyanosis, 1+ bilateral lower extremity edema.  Radials 2+/PT 2+ and equal bilaterally.  Respiratory:  Respirations regular and unlabored, clear to auscultation bilaterally. GI: Soft, nontender, nondistended, BS + x 4. MS: no deformity or atrophy. Skin: warm and dry, no rash. Neuro:  Strength and sensation are intact. Psych: Normal affect.  Accessory Clinical Findings    ECG personally reviewed by me today -regular sinus rhythm, 67, first-degree AV block, right bundle branch block- no acute  changes.  Lab Results  Component Value Date   WBC 4.0 08/16/2022   HGB 12.3 (L) 08/16/2022   HCT 38.3 (L) 08/16/2022   MCV 92.5 08/16/2022   PLT 206 08/16/2022   Lab Results  Component Value Date   CREATININE 0.77 08/16/2022   BUN 12 08/16/2022   NA 139 08/16/2022   K 4.1 08/16/2022   CL 106 08/16/2022   CO2 24 08/16/2022   Lab Results  Component Value Date   ALT 25 08/16/2022   AST 21 08/16/2022   ALKPHOS 54 08/16/2022   BILITOT 0.7 08/16/2022   Lab Results  Component Value Date   CHOL 83 07/29/2022   HDL 43 07/29/2022   LDLCALC 25 07/29/2022   TRIG 77 07/29/2022   CHOLHDL 1.9 07/29/2022    Lab Results  Component Value Date   HGBA1C 11.3 (H) 07/28/2022    Assessment & Plan    1.  Paroxysmal atrial fibrillation: Patient was hospitalized in March of this year with DKA and rapid atrial fibrillation.  He converted on amiodarone therapy.  In the setting of short-term duration, oral anticoagulation was not initiated (CHA2DS2-VASc equals 3).  At his last office visit, a ZIO monitor was ordered in order to determine his burden of A-fib and whether or not ongoing antiarrhythmic therapy and/or anticoagulation are indicated.   Unfortunately, [employer] reports that device was never activated, though the patient says that he wore for 2 weeks and mailed back.  He was under the impression that he had activated it correctly.  He has not had any symptoms of atrial fibrillation since his last visit.  He agreed to replace a monitor as if he has no recurrence of A-fib, we will plan to stop amiodarone and continue diltiazem.  2.  Essential hypertension: Stable on calcium channel blocker therapy.  3.  Hyperlipidemia: LDL of 25 on rosuvastatin and ezetimibe.  4.  Aortic atherosclerosis: Atherosclerotic calcification of the aorta noted on chest x-ray during hospitalization in March 2024.  He remains on low-dose aspirin, statin, Zetia.  5.  Type 2 diabetes mellitus: A1c 11.3 in March.  Followed by PCP.  Has drastically changed his diet at home and notes that sugars have been trending well.  On Lantus and Jardiance.  6.  Dependent edema: Patient with mild dependent edema over the past several months.  This preceded his hospitalization in March.  He has 1+ bilateral ankle edema on examination today.  It is possible that diltiazem is playing some role in this though development preceded initiation.  Discussed the importance of salt avoidance, which he and his wife are already doing.  Encouraged that he keep his legs elevated when possible and also they wear compression socks throughout the day.  Disposition: Follow-up 14-day ZIO monitor.  Follow-up in clinic in 2 months or sooner if necessary.  Nicolasa Ducking, NP 10/05/2022, 12:25 PM

## 2022-10-10 DIAGNOSIS — I480 Paroxysmal atrial fibrillation: Secondary | ICD-10-CM

## 2022-10-23 ENCOUNTER — Telehealth: Payer: Self-pay | Admitting: Nurse Practitioner

## 2022-10-23 ENCOUNTER — Encounter: Payer: Self-pay | Admitting: Nurse Practitioner

## 2022-10-23 ENCOUNTER — Ambulatory Visit (INDEPENDENT_AMBULATORY_CARE_PROVIDER_SITE_OTHER): Payer: Medicare Other | Admitting: Nurse Practitioner

## 2022-10-23 VITALS — BP 138/70 | HR 67 | Temp 98.2°F | Resp 16 | Ht 75.0 in | Wt 210.8 lb

## 2022-10-23 DIAGNOSIS — E782 Mixed hyperlipidemia: Secondary | ICD-10-CM

## 2022-10-23 DIAGNOSIS — I10 Essential (primary) hypertension: Secondary | ICD-10-CM | POA: Diagnosis not present

## 2022-10-23 DIAGNOSIS — E1165 Type 2 diabetes mellitus with hyperglycemia: Secondary | ICD-10-CM | POA: Diagnosis not present

## 2022-10-23 DIAGNOSIS — I480 Paroxysmal atrial fibrillation: Secondary | ICD-10-CM | POA: Diagnosis not present

## 2022-10-23 LAB — POCT GLYCOSYLATED HEMOGLOBIN (HGB A1C): Hemoglobin A1C: 8.8 % — AB (ref 4.0–5.6)

## 2022-10-23 MED ORDER — LANTUS SOLOSTAR 100 UNIT/ML ~~LOC~~ SOPN
30.0000 [IU] | PEN_INJECTOR | Freq: Every day | SUBCUTANEOUS | Status: DC
Start: 2022-10-23 — End: 2023-01-25

## 2022-10-23 NOTE — Telephone Encounter (Signed)
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 I will call him when letter is done, so he can p/u both at same time-Toni

## 2022-10-23 NOTE — Addendum Note (Signed)
Addended by: Norva Pavlov on: 10/23/2022 03:55 PM   Modules accepted: Orders

## 2022-10-23 NOTE — Progress Notes (Signed)
[facility] [address] [place], [place] [address]  Internal MEDICINE  Office Visit Note  Patient Name: Michael Gross  [id number]  [id number]  Date of Service: 10/23/2022  Chief Complaint  Patient presents with   Diabetes   Hyperlipidemia   Follow-up    HPI Emersen presents for a follow-up visit for diabetes, AFIB, and hypertension. Diabetes -- significantly improved A1c down to 8.8 from 11.3. Insurance prefers jardiance cannot afford every month due to "donut hole" w/insurance. Taking approx 30 units daily of lantus, was switched to generic humalog for sliding scale. Reports vision is improving  Wants to start biking.  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   aspirin EC 81 MG tablet Take 1 tablet (81 mg total) by mouth daily. Swallow whole.   Cholecalciferol (VITAMIN D3 PO) Take by mouth.   Cyanocobalamin (VITAMIN B-12 PO) Take by mouth.   diltiazem (CARDIZEM CD) 120 MG 24 hr capsule Take 1 capsule (120 mg total) by mouth daily.   empagliflozin (JARDIANCE) 25 MG TABS tablet Take 1 tablet (25 mg total) by mouth daily.   ezetimibe (ZETIA) 10 MG tablet Take 1 tablet (10 mg total) by mouth daily.   insulin lispro (HUMALOG KWIKPEN) 100 UNIT/ML KwikPen Inject insulin dose into the skin before meals per sliding scale. Max dose per 24 hours: 42 units.   Insulin Pen Needle (BD PEN NEEDLE NANO U/F) 32G X 4 MM MISC Use as directed with SOLIQUA to inject insulin every morning E11.65   Lancets 30G MISC Use to check blood sugars 5 times per day   Multiple Vitamin (MULTIVITAMIN WITH MINERALS) TABS tablet Take 1 tablet by mouth daily.   pantoprazole (PROTONIX) 40 MG tablet Take 1 tablet (40 mg total) by mouth 2 (two)  times daily before a meal.   pregabalin (LYRICA) 25 MG capsule Take 1 capsule (25 mg total) by mouth 2 (two) times daily.   rosuvastatin (CRESTOR) 40 MG tablet TAKE 1 TABLET BY MOUTH EVERY DAY   tamsulosin (FLOMAX) 0.4 MG CAPS capsule Take 2 capsules (0.8 mg total) by mouth daily.   [DISCONTINUED] insulin glargine (LANTUS SOLOSTAR) 100 UNIT/ML Solostar Pen Inject 20 Units into the skin at bedtime. (Patient taking differently: Inject 30 Units into the skin at bedtime.)   insulin glargine (LANTUS SOLOSTAR) 100 UNIT/ML Solostar Pen Inject 30 Units into the skin at bedtime.   No facility-administered encounter medications on file as of 10/23/2022.    Surgical History: Past Surgical History:  Procedure Laterality Date   COLONOSCOPY WITH PROPOFOL N/A 07/27/2022   Procedure: COLONOSCOPY WITH PROPOFOL;  Surgeon: Toney Reil, MD;  Location: [facility];  Service: Endoscopy;  Laterality: N/A;  Diabetic   HERNIA REPAIR     2003   KNEE SURGERY     1998   MOHS SURGERY     2003   SHOULDER ARTHROSCOPY WITH OPEN ROTATOR CUFF REPAIR AND DISTAL CLAVICLE ACROMINECTOMY Left 08/11/2021   Procedure: SHOULDER ARTHROSCOPY WITH OPEN ROTATOR CUFF REPAIR AND DISTAL CLAVICLE ACROMINECTOMY;  Surgeon: Juanell Fairly, MD;  Location: [facility];  Service: Orthopedics;  Laterality: Left;    Medical History: Past Medical History:  Diagnosis Date   Aortic atherosclerosis (HCC)  07/2022   noted on CXR   Arthritis    ankles   Basal cell carcinoma    BPH (benign prostatic hyperplasia)    Complication of anesthesia    slow to wake   Diabetes (HCC)    a. diagnosed in his 40's.   Diastolic dysfunction    a. 11/2019 Echo: EF 60-65%, no rwma, nl RV fxn, no significant valvular abnormality; b. 07/2022 Echo: EF 60-65%, no rwma, GrI DD, nl RV fxn, RVSP 29.43mmHg, AoV sclerosis w/o stenosis.   Hyperlipidemia    PAF (paroxysmal atrial fibrillation) (HCC)    a. 07/2022 noted in the setting of DKA - brief.   CHA2DS2VASc = 3-->OAC deferred for now.   Vertigo    none for over 15 years    Family History: Family History  Problem Relation Age of Onset   Heart disease Father    Prostate cancer Neg Hx    Kidney cancer Neg Hx    Bladder Cancer Neg Hx     Social History   Socioeconomic History   Marital status: Married    Spouse name: Hilda Lias   Number of children: 4   Years of education: Not on file   Highest education level: Not on file  Occupational History   Not on file  Tobacco Use   Smoking status: Never   Smokeless tobacco: Never  Vaping Use   Vaping Use: Never used  Substance and Sexual Activity   Alcohol use: Yes    Comment: rare   Drug use: No   Sexual activity: Yes  Other Topics Concern   Not on file  Social History Narrative   Not on file   Social Determinants of Health   Financial Resource Strain: Not on file  Food Insecurity: No Food Insecurity (08/01/2022)   Hunger Vital Sign    Worried About Running Out of Food in the Last Year: Never true    Ran Out of Food in the Last Year: Never true  Transportation Needs: No Transportation Needs (08/01/2022)   PRAPARE - Administrator, Civil Service (Medical): No    Lack of Transportation (Non-Medical): No  Physical Activity: Not on file  Stress: Not on file  Social Connections: Not on file  Intimate Partner Violence: Not At Risk (08/01/2022)   Humiliation, Afraid, Rape, and Kick questionnaire    Fear of Current or Ex-Partner: No    Emotionally Abused: No    Physically Abused: No    Sexually Abused: No      Review of Systems  Constitutional:  Positive for fatigue. Negative for chills and unexpected weight change.  HENT: Negative.  Negative for congestion, postnasal drip, rhinorrhea, sneezing and sore throat.   Eyes:  Negative for redness.  Respiratory: Negative.  Negative for cough, chest tightness, shortness of breath and wheezing.   Cardiovascular: Negative.  Negative for chest pain and palpitations.   Gastrointestinal:  Negative for abdominal pain, constipation, diarrhea, nausea and vomiting.  Endocrine: Negative for polyuria.  Genitourinary:  Negative for decreased urine volume, difficulty urinating, dysuria and frequency.  Musculoskeletal:  Positive for arthralgias and gait problem (has right foot drop). Negative for back pain, joint swelling and neck pain.  Skin:  Negative for rash.  Neurological:  Positive for tremors. Negative for numbness.  Hematological:  Negative for adenopathy. Does not bruise/bleed easily.  Psychiatric/Behavioral:  Negative for behavioral problems (Depression), sleep disturbance and suicidal ideas. The patient is not nervous/anxious.     Vital Signs: BP 138/70  Pulse 67   Temp 98.2 F (36.8 C)   Resp 16   Ht 6\' 3"  (1.905 m)   Wt 210 lb 12.8 oz (95.6 kg)   SpO2 96%   BMI 26.35 kg/m    Physical Exam Vitals reviewed.  Constitutional:      General: He is not in acute distress.    Appearance: Normal appearance. He is normal weight. He is not ill-appearing.  HENT:     Head: Normocephalic and atraumatic.  Eyes:     Pupils: Pupils are equal, round, and reactive to light.  Cardiovascular:     Rate and Rhythm: Normal rate and regular rhythm.  Pulmonary:     Effort: Pulmonary effort is normal. No respiratory distress.  Neurological:     Mental Status: He is alert and oriented to person, place, and time.  Psychiatric:        Mood and Affect: Mood normal.        Behavior: Behavior normal.        Assessment/Plan: 1. Uncontrolled type 2 diabetes mellitus with hyperglycemia (HCC) A1c significantly improved. Urine microalbumin lab sent. Continue insulin basal and sliding scale, continue diet and lifestyle modifications as discussed. Repeat A1c in 3 months  - POCT glycosylated hemoglobin (Hb A1C) - Urine Microalbumin w/creat. ratio - insulin glargine (LANTUS SOLOSTAR) 100 UNIT/ML Solostar Pen; Inject 30 Units into the skin at bedtime.  2. Essential  hypertension Stable, continue medications as prescribed.   3. Paroxysmal atrial fibrillation (HCC) Continue diltiazem and amiodarone as prescribed.   4. Mixed hyperlipidemia Continue rosuvastatin 40 mg daily as prescribed.    General Counseling: sanjuan satkowski understanding of the findings of todays visit and agrees with plan of treatment. I have discussed any further diagnostic evaluation that may be needed or ordered today. We also reviewed his medications today. he has been encouraged to call the office with any questions or concerns that should arise related to todays visit.    Orders Placed This Encounter  Procedures   Urine Microalbumin w/creat. ratio   POCT glycosylated hemoglobin (Hb A1C)    Meds ordered this encounter  Medications   insulin glargine (LANTUS SOLOSTAR) 100 UNIT/ML Solostar Pen    Sig: Inject 30 Units into the skin at bedtime.    Return in about 3 months (around 01/30/2023) for F/U, Recheck A1C, Hayden Kihara PCP.   Total time spent:30 Minutes Time spent includes review of chart, medications, test results, and follow up plan with the patient.   [place] Controlled Substance Database was reviewed by me.  This patient was seen by Sallyanne Kuster, FNP-C in collaboration with Dr. Beverely Risen as a part of collaborative care agreement.   Nova Schmuhl R. Tedd Sias, MSN, FNP-C Internal medicine

## 2022-10-24 LAB — MICROALBUMIN / CREATININE URINE RATIO
Creatinine, Urine: 37.4 mg/dL
Microalb/Creat Ratio: 134 mg/g creat — ABNORMAL HIGH (ref 0–29)
Microalbumin, Urine: 50 ug/mL

## 2022-10-25 ENCOUNTER — Telehealth: Payer: Self-pay | Admitting: Nurse Practitioner

## 2022-10-25 NOTE — Telephone Encounter (Signed)
Lvm to schedule eye exam for dot physical-Toni

## 2022-10-27 ENCOUNTER — Telehealth: Payer: Self-pay | Admitting: Nurse Practitioner

## 2022-10-27 NOTE — Telephone Encounter (Signed)
Patient called back stating eye exam will be done @ his Ophthalmology office. DOT & Letter completed. At front desk for patient-Toni

## 2022-11-05 ENCOUNTER — Other Ambulatory Visit: Payer: Self-pay | Admitting: Nurse Practitioner

## 2022-11-05 DIAGNOSIS — Z79899 Other long term (current) drug therapy: Secondary | ICD-10-CM

## 2022-11-06 ENCOUNTER — Ambulatory Visit: Payer: Medicare Other

## 2022-11-07 ENCOUNTER — Telehealth: Payer: Self-pay | Admitting: *Deleted

## 2022-11-07 NOTE — Telephone Encounter (Signed)
Spoke with patient and informed him of the provider's results and recommendations as follows:  "Patient's heart monitor showed a predominant rhythm of sinus with an average rate of 69 bpm (range 49 to 171 bpm, 1 run of NSVT (fast rhythm coming from the bottom portion of the heart) occurred lasting 6 beats, 8 episodes of SVT (fast rhythm coming from the top portion of the heart) occurred with the longest episode lasting 8 beats.  Rare extra beats from the top and bottom portions of the heart.  Patient triggered events were associated with sinus rhythm.   No evidence of recurrent A-fib.  As noted at [facility]' office visit, given no further A-fib has been identified, he may discontinue amiodarone with continuation of Cardizem CD.  Follow-up as scheduled."  Patient understood with read back and stated he'd follow-up as scheduled.

## 2022-11-07 NOTE — Telephone Encounter (Signed)
Patient returned call

## 2022-11-07 NOTE — Telephone Encounter (Signed)
-----   Message from Sondra Barges, PA-C sent at 11/06/2022  7:15 AM EDT ----- Patient's heart monitor showed a predominant rhythm of sinus with an average rate of 69 bpm (range 49 to 171 bpm, 1 run of NSVT (fast rhythm coming from the bottom portion of the heart) occurred lasting 6 beats, 8 episodes of SVT (fast rhythm coming from the top portion of the heart) occurred with the longest episode lasting 8 beats.  Rare extra beats from the top and bottom portions of the heart.  Patient triggered events were associated with sinus rhythm.  No evidence of recurrent A-fib.  As noted at [facility] office visit, given no further A-fib has been identified, he may discontinue amiodarone with continuation of Cardizem CD.  Follow-up as scheduled.

## 2022-11-07 NOTE — Telephone Encounter (Signed)
Left voicemail message to call back  

## 2022-11-17 ENCOUNTER — Encounter: Payer: Self-pay | Admitting: Nurse Practitioner

## 2022-11-17 ENCOUNTER — Ambulatory Visit: Payer: Medicare Other | Attending: Nurse Practitioner | Admitting: Nurse Practitioner

## 2022-11-17 ENCOUNTER — Ambulatory Visit: Payer: PRIVATE HEALTH INSURANCE | Admitting: Nurse Practitioner

## 2022-11-17 VITALS — BP 102/62 | HR 67 | Ht 75.0 in | Wt 209.8 lb

## 2022-11-17 DIAGNOSIS — R609 Edema, unspecified: Secondary | ICD-10-CM

## 2022-11-17 DIAGNOSIS — E7800 Pure hypercholesterolemia, unspecified: Secondary | ICD-10-CM

## 2022-11-17 DIAGNOSIS — I700 Atherosclerosis of aorta: Secondary | ICD-10-CM | POA: Diagnosis not present

## 2022-11-17 DIAGNOSIS — I10 Essential (primary) hypertension: Secondary | ICD-10-CM

## 2022-11-17 DIAGNOSIS — Z794 Long term (current) use of insulin: Secondary | ICD-10-CM

## 2022-11-17 DIAGNOSIS — E118 Type 2 diabetes mellitus with unspecified complications: Secondary | ICD-10-CM

## 2022-11-17 DIAGNOSIS — I480 Paroxysmal atrial fibrillation: Secondary | ICD-10-CM | POA: Diagnosis not present

## 2022-11-17 MED ORDER — METOPROLOL SUCCINATE ER 25 MG PO TB24: 25.0000 mg | ORAL_TABLET | Freq: Every day | ORAL | 1 refills | Status: DC

## 2022-11-17 NOTE — Patient Instructions (Addendum)
Medication Instructions:  STOP the Diltiazem  START the Metoprolol (Toprol XL) 25 mg once daily  *If you need a refill on your cardiac medications before your next appointment, please call your pharmacy*   Lab Work: None ordered If you have labs (blood work) drawn today and your tests are completely normal, you will receive your results only by: MyChart Message (if you have MyChart) OR A paper copy in the mail If you have any lab test that is abnormal or we need to change your treatment, we will call you to review the results.   Testing/Procedures: None ordered   Follow-Up: At [facility], you and your health needs are our priority.  As part of our continuing mission to provide you with exceptional heart care, we have created designated Provider Care Teams.  These Care Teams include your primary Cardiologist (physician) and Advanced Practice Providers (APPs -  Physician Assistants and Nurse Practitioners) who all work together to provide you with the care you need, when you need it.  We recommend signing up for the patient portal called "MyChart".  Sign up information is provided on this After Visit Summary.  MyChart is used to connect with patients for Virtual Visits (Telemedicine).  Patients are able to view lab/test results, encounter notes, upcoming appointments, etc.  Non-urgent messages can be sent to your provider as well.   To learn more about what you can do with MyChart, go to [email or web address].    Your next appointment:   6 month(s)  Provider:   Dr. Mariah Milling

## 2022-11-17 NOTE — Progress Notes (Signed)
Office Visit    Patient Name: Michael Gross Date of Encounter: 11/17/2022  Primary Care Provider:  Sallyanne Kuster, NP Primary Cardiologist:  [name], MD  Chief Complaint    67 y.o. y/o male with a history of paroxysmal atrial fibrillation, hypertension, hyperlipidemia, diabetes, DKA, GERD, BPH, and aortic atherosclerosis, presents for follow-up of atrial fibrillation.   Past Medical History    Past Medical History:  Diagnosis Date   Aortic atherosclerosis (HCC) 07/2022   noted on CXR   Arthritis    ankles   Basal cell carcinoma    BPH (benign prostatic hyperplasia)    Complication of anesthesia    slow to wake   Diabetes (HCC)    a. diagnosed in his 40's.   Diastolic dysfunction    a. 11/2019 Echo: EF 60-65%, no rwma, nl RV fxn, no significant valvular abnormality; b. 07/2022 Echo: EF 60-65%, no rwma, GrI DD, nl RV fxn, RVSP 29.14mmHg, AoV sclerosis w/o stenosis.   Hyperlipidemia    PAF (paroxysmal atrial fibrillation) (HCC)    a. 07/2022 noted in the setting of DKA - brief.  CHA2DS2VASc = 3-->OAC deferred for now; b. 10/2022 Zio: Predominantly sinus rhythm at 69 (49-171).  1 6 beat run of nonsustained VT.  8 brief runs of PSVT (fastest 8 beats).  Rare PAC and PVC.  Triggered event associated with sinus rhythm.  No atrial fibrillation.   Vertigo    none for over 15 years   Past Surgical History:  Procedure Laterality Date   COLONOSCOPY WITH PROPOFOL N/A 07/27/2022   Procedure: COLONOSCOPY WITH PROPOFOL;  Surgeon: Toney Reil, MD;  Location: [facility];  Service: Endoscopy;  Laterality: N/A;  Diabetic   HERNIA REPAIR     2003   KNEE SURGERY     1998   MOHS SURGERY     2003   SHOULDER ARTHROSCOPY WITH OPEN ROTATOR CUFF REPAIR AND DISTAL CLAVICLE ACROMINECTOMY Left 08/11/2021   Procedure: SHOULDER ARTHROSCOPY WITH OPEN ROTATOR CUFF REPAIR AND DISTAL CLAVICLE ACROMINECTOMY;  Surgeon: Juanell Fairly, MD;  Location: [facility];  Service:  Orthopedics;  Laterality: Left;    Allergies  No Known Allergies  History of Present Illness      67 y.o. y/o male with above past medical history including paroxysmal atrial fibrillation, hypertension, hyperlipidemia, diabetes, DKA, GERD, BPH, and aortic atherosclerosis.  He was admitted in March 2024 for DKA in the setting of holding his diabetic medications because he was pending a colonoscopy.  During hospitalization, he developed atrial fibrillation with rapid ventricular response that was treated with intravenous amiodarone with subsequent conversion to sinus rhythm.  Troponin was minimally elevated at 47, which was felt to be secondary to demand ischemia in the setting of DKA and tachycardia.  Echo showed normal LV function with grade 1 diastolic dysfunction and aortic valve sclerosis without stenosis.  Given short duration of A-fib, oral anticoagulation was deferred.  He was discharged home on amiodarone with plan to continue for 2 months.  Subsequent zio monitoring showed no evidence of recurrent afib, and amiodarone was d/c'd in late June 2024.   Since his last visit, Michael Gross has continued to feel well.  He is unaware of any recurrence of atrial fibrillation and denies palpitations.  He continues to experience mild ankle swelling, which preceded initiation of diltiazem but he thinks it is worsened since starting.  He is interested in switching this to something else.  He denies chest pain, dyspnea, PND, orthopnea, dizziness,  syncope, or early satiety.  Home Medications    Current Outpatient Medications  Medication Sig Dispense Refill   Ascorbic Acid (VITAMIN C PO) Take by mouth.     aspirin EC 81 MG tablet Take 1 tablet (81 mg total) by mouth daily. Swallow whole. 30 tablet 0   Cholecalciferol (VITAMIN D3 PO) Take by mouth.     Cyanocobalamin (VITAMIN B-12 PO) Take by mouth.     empagliflozin (JARDIANCE) 25 MG TABS tablet Take 1 tablet (25 mg total) by mouth daily. 30 tablet 5    ezetimibe (ZETIA) 10 MG tablet Take 1 tablet (10 mg total) by mouth daily. 90 tablet 1   insulin glargine (LANTUS SOLOSTAR) 100 UNIT/ML Solostar Pen Inject 30 Units into the skin at bedtime.     insulin lispro (HUMALOG KWIKPEN) 100 UNIT/ML KwikPen Inject insulin dose into the skin before meals per sliding scale. Max dose per 24 hours: 42 units. 15 mL 2   Insulin Pen Needle (BD PEN NEEDLE NANO U/F) 32G X 4 MM MISC Use as directed with SOLIQUA to inject insulin every morning E11.65 300 each 1   Lancets 30G MISC Use to check blood sugars 5 times per day     metoprolol succinate (TOPROL-XL) 25 MG 24 hr tablet Take 1 tablet (25 mg total) by mouth daily. 90 tablet 1   Multiple Vitamin (MULTIVITAMIN WITH MINERALS) TABS tablet Take 1 tablet by mouth daily.     pantoprazole (PROTONIX) 40 MG tablet TAKE 1 TABLET (40 MG TOTAL) BY MOUTH TWICE A DAY BEFORE MEALS 180 tablet 1   pregabalin (LYRICA) 25 MG capsule Take 1 capsule (25 mg total) by mouth 2 (two) times daily. 60 capsule 2   rosuvastatin (CRESTOR) 40 MG tablet TAKE 1 TABLET BY MOUTH EVERY DAY 90 tablet 3   tamsulosin (FLOMAX) 0.4 MG CAPS capsule Take 2 capsules (0.8 mg total) by mouth daily. 180 capsule 1   No current facility-administered medications for this visit.     Review of Systems    Mild ankle swelling as outlined above.  Chest pain, dyspnea, palpitations, PND, orthopnea, dizziness, syncope, or early satiety.  All other systems reviewed and are otherwise negative except as noted above.    Physical Exam    VS:  BP 102/62 (BP Location: Left Arm, Patient Position: Sitting)   Pulse 67   Ht 6\' 3"  (1.905 m)   Wt 209 lb 12.8 oz (95.2 kg)   SpO2 94%   BMI 26.22 kg/m  , BMI Body mass index is 26.22 kg/m.     GEN: Well nourished, well developed, in no acute distress. HEENT: normal. Neck: Supple, no JVD, carotid bruits, or masses. Cardiac: RRR, no murmurs, rubs, or gallops. No clubbing, cyanosis, 1+ bilat ankle edema.  Radials 2+/PT 2+ and  equal bilaterally.  Respiratory:  Respirations regular and unlabored, clear to auscultation bilaterally. GI: Soft, nontender, nondistended, BS + x 4. MS: no deformity or atrophy. Skin: warm and dry, no rash. Neuro:  Strength and sensation are intact. Psych: Normal affect.  Accessory Clinical Findings    Lab Results  Component Value Date   WBC 4.0 08/16/2022   HGB 12.3 (L) 08/16/2022   HCT 38.3 (L) 08/16/2022   MCV 92.5 08/16/2022   PLT 206 08/16/2022   Lab Results  Component Value Date   CREATININE 0.77 08/16/2022   BUN 12 08/16/2022   NA 139 08/16/2022   K 4.1 08/16/2022   CL 106 08/16/2022   CO2 24 08/16/2022  Lab Results  Component Value Date   ALT 25 08/16/2022   AST 21 08/16/2022   ALKPHOS 54 08/16/2022   BILITOT 0.7 08/16/2022   Lab Results  Component Value Date   CHOL 83 07/29/2022   HDL 43 07/29/2022   LDLCALC 25 07/29/2022   TRIG 77 07/29/2022   CHOLHDL 1.9 07/29/2022    Lab Results  Component Value Date   HGBA1C 8.8 (A) 10/23/2022    Assessment & Plan    1.  Paroxysmal atrial fibrillation: Patient hospitalized in March 2024 with DKA and rapid A-fib.  He converted on amiodarone.  Oral anticoagulation was not initiated due to short-term duration of A-fib (CHA2DS2-VASc 3).  He recently completed a ZIO monitor and had no evidence of atrial fibrillation.  We stopped his amiodarone and he has since done well without recurrent symptoms.  He is having some ankle swelling in the setting of diltiazem therapy and I will discontinue this in favor of Toprol-XL 25 mg daily.  He remains on aspirin 81 mg daily as well.  2.  Essential hypertension: Blood pressure stable to soft on diltiazem, which I am discontinuing in favor of Toprol-XL due to lower extremity swelling.  3.  Hyperlipidemia: LDL of 25 in March in the setting of rosuvastatin and Zetia.  4.  Aortic atherosclerosis: Noted on chest x-ray during hospitalization in March 2024.  He remains on low-dose  aspirin, statin, and Zetia.  5.  Type 2 diabetes mellitus: A1c previously 11.3 but now down to 8.8.  Is being followed closely by primary care and is on Lantus and Jardiance.  6.  Lower extremity edema: Worse since starting diltiazem earlier this year.  Changing to Toprol-XL.  7.  Disposition: Follow-up in 6 months or sooner if necessary.  Informed Consent    Nicolasa Ducking, NP 11/17/2022, 12:26 PM

## 2022-11-22 ENCOUNTER — Ambulatory Visit: Payer: Medicare Other | Admitting: Dermatology

## 2022-12-04 ENCOUNTER — Other Ambulatory Visit: Payer: Self-pay | Admitting: Nurse Practitioner

## 2022-12-04 DIAGNOSIS — E1165 Type 2 diabetes mellitus with hyperglycemia: Secondary | ICD-10-CM

## 2022-12-06 NOTE — Telephone Encounter (Signed)
Please review and send 

## 2022-12-11 ENCOUNTER — Ambulatory Visit: Payer: Medicare Other | Admitting: Dermatology

## 2022-12-11 VITALS — BP 125/71 | HR 54

## 2022-12-11 DIAGNOSIS — Z85828 Personal history of other malignant neoplasm of skin: Secondary | ICD-10-CM

## 2022-12-11 DIAGNOSIS — L570 Actinic keratosis: Secondary | ICD-10-CM

## 2022-12-11 DIAGNOSIS — D489 Neoplasm of uncertain behavior, unspecified: Secondary | ICD-10-CM

## 2022-12-11 DIAGNOSIS — L821 Other seborrheic keratosis: Secondary | ICD-10-CM

## 2022-12-11 DIAGNOSIS — Z1283 Encounter for screening for malignant neoplasm of skin: Secondary | ICD-10-CM

## 2022-12-11 DIAGNOSIS — L720 Epidermal cyst: Secondary | ICD-10-CM

## 2022-12-11 DIAGNOSIS — D1801 Hemangioma of skin and subcutaneous tissue: Secondary | ICD-10-CM

## 2022-12-11 DIAGNOSIS — D239 Other benign neoplasm of skin, unspecified: Secondary | ICD-10-CM

## 2022-12-11 DIAGNOSIS — W908XXA Exposure to other nonionizing radiation, initial encounter: Secondary | ICD-10-CM

## 2022-12-11 DIAGNOSIS — L918 Other hypertrophic disorders of the skin: Secondary | ICD-10-CM

## 2022-12-11 DIAGNOSIS — D225 Melanocytic nevi of trunk: Secondary | ICD-10-CM | POA: Diagnosis not present

## 2022-12-11 DIAGNOSIS — D692 Other nonthrombocytopenic purpura: Secondary | ICD-10-CM

## 2022-12-11 DIAGNOSIS — L814 Other melanin hyperpigmentation: Secondary | ICD-10-CM

## 2022-12-11 DIAGNOSIS — L578 Other skin changes due to chronic exposure to nonionizing radiation: Secondary | ICD-10-CM

## 2022-12-11 DIAGNOSIS — Z872 Personal history of diseases of the skin and subcutaneous tissue: Secondary | ICD-10-CM

## 2022-12-11 DIAGNOSIS — L729 Follicular cyst of the skin and subcutaneous tissue, unspecified: Secondary | ICD-10-CM

## 2022-12-11 DIAGNOSIS — L820 Inflamed seborrheic keratosis: Secondary | ICD-10-CM | POA: Diagnosis not present

## 2022-12-11 DIAGNOSIS — D229 Melanocytic nevi, unspecified: Secondary | ICD-10-CM

## 2022-12-11 HISTORY — DX: Other benign neoplasm of skin, unspecified: D23.9

## 2022-12-11 NOTE — Patient Instructions (Addendum)
Biopsy Wound Care Instructions  Leave the original bandage on for 24 hours if possible.  If the bandage becomes soaked or soiled before that time, it is OK to remove it and examine the wound.  A small amount of post-operative bleeding is normal.  If excessive bleeding occurs, remove the bandage, place gauze over the site and apply continuous pressure (no peeking) over the area for 30 minutes. If this does not work, please call our clinic as soon as possible or page your doctor if it is after hours.   Once a day, cleanse the wound with soap and water. It is fine to shower. If a thick crust develops you may use a Q-tip dipped into dilute hydrogen peroxide (mix 1:1 with water) to dissolve it.  Hydrogen peroxide can slow the healing process, so use it only as needed.    After washing, apply petroleum jelly (Vaseline) or an antibiotic ointment if your doctor prescribed one for you, followed by a bandage.    For best healing, the wound should be covered with a layer of ointment at all times. If you are not able to keep the area covered with a bandage to hold the ointment in place, this may mean re-applying the ointment several times a day.  Continue this wound care until the wound has healed and is no longer open.   Itching and mild discomfort is normal during the healing process. However, if you develop pain or severe itching, please call our office.   If you have any discomfort, you can take Tylenol (acetaminophen) or ibuprofen as directed on the bottle. (Please do not take these if you have an allergy to them or cannot take them for another reason).  Some redness, tenderness and white or yellow material in the wound is normal healing.  If the area becomes very sore and red, or develops a thick yellow-green material (pus), it may be infected; please notify us.    If you have stitches, return to clinic as directed to have the stitches removed. You will continue wound care for 2-3 days after the stitches  are removed.   Wound healing continues for up to one year following surgery. It is not unusual to experience pain in the scar from time to time during the interval.  If the pain becomes severe or the scar thickens, you should notify the office.    A slight amount of redness in a scar is expected for the first six months.  After six months, the redness will fade and the scar will soften and fade.  The color difference becomes less noticeable with time.  If there are any problems, return for a post-op surgery check at your earliest convenience.  To improve the appearance of the scar, you can use silicone scar gel, cream, or sheets (such as Mederma or Serica) every night for up to one year. These are available over the counter (without a prescription).  Please call our office at [phone] for any questions or concerns.          Actinic keratoses are precancerous spots that appear secondary to cumulative UV radiation exposure/sun exposure over time. They are chronic with expected duration over 1 year. A portion of actinic keratoses will progress to squamous cell carcinoma of the skin. It is not possible to reliably predict which spots will progress to skin cancer and so treatment is recommended to prevent development of skin cancer.  Recommend daily broad spectrum sunscreen SPF 30+ to sun-exposed areas, reapply  every 2 hours as needed.  Recommend staying in the shade or wearing long sleeves, sun glasses (UVA+UVB protection) and wide brim hats (4-inch brim around the entire circumference of the hat). Call for new or changing lesions.   Cryotherapy Aftercare  Wash gently with soap and water everyday.   Apply Vaseline and Band-Aid daily until healed.    Seborrheic Keratosis  What causes seborrheic keratoses? Seborrheic keratoses are harmless, common skin growths that first appear during adult life.  As time goes by, more growths appear.  Some people may develop a large number of them.   Seborrheic keratoses appear on both covered and uncovered body parts.  They are not caused by sunlight.  The tendency to develop seborrheic keratoses can be inherited.  They vary in color from skin-colored to gray, brown, or even black.  They can be either smooth or have a rough, warty surface.   Seborrheic keratoses are superficial and look as if they were stuck on the skin.  Under the microscope this type of keratosis looks like layers upon layers of skin.  That is why at times the top layer may seem to fall off, but the rest of the growth remains and re-grows.    Treatment Seborrheic keratoses do not need to be treated, but can easily be removed in the office.  Seborrheic keratoses often cause symptoms when they rub on clothing or jewelry.  Lesions can be in the way of shaving.  If they become inflamed, they can cause itching, soreness, or burning.  Removal of a seborrheic keratosis can be accomplished by freezing, burning, or surgery. If any spot bleeds, scabs, or grows rapidly, please return to have it checked, as these can be an indication of a skin cancer.  Melanoma ABCDEs  Melanoma is the most dangerous type of skin cancer, and is the leading cause of death from skin disease.  You are more likely to develop melanoma if you: Have light-colored skin, light-colored eyes, or red or blond hair Spend a lot of time in the sun Tan regularly, either outdoors or in a tanning bed Have had blistering sunburns, especially during childhood Have a close family member who has had a melanoma Have atypical moles or large birthmarks  Early detection of melanoma is key since treatment is typically straightforward and cure rates are extremely high if we catch it early.   The first sign of melanoma is often a change in a mole or a new dark spot.  The ABCDE system is a way of remembering the signs of melanoma.  A for asymmetry:  The two halves do not match. B for border:  The edges of the growth are  irregular. C for color:  A mixture of colors are present instead of an even brown color. D for diameter:  Melanomas are usually (but not always) greater than 6mm - the size of a pencil eraser. E for evolution:  The spot keeps changing in size, shape, and color.  Please check your skin once per month between visits. You can use a small mirror in front and a large mirror behind you to keep an eye on the back side or your body.   If you see any new or changing lesions before your next follow-up, please call to schedule a visit.  Please continue daily skin protection including broad spectrum sunscreen SPF 30+ to sun-exposed areas, reapplying every 2 hours as needed when you're outdoors.   Staying in the shade or wearing long sleeves, sun  glasses (UVA+UVB protection) and wide brim hats (4-inch brim around the entire circumference of the hat) are also recommended for sun protection.    Due to recent changes in healthcare laws, you may see results of your pathology and/or laboratory studies on MyChart before the doctors have had a chance to review them. We understand that in some cases there may be results that are confusing or concerning to you. Please understand that not all results are received at the same time and often the doctors may need to interpret multiple results in order to provide you with the best plan of care or course of treatment. Therefore, we ask that you please give [place] 2 business days to thoroughly review all your results before contacting the office for clarification. Should we see a critical lab result, you will be contacted sooner.   If You Need Anything After Your Visit  If you have any questions or concerns for your doctor, please call our main line at [phone] and press option 4 to reach your doctor's medical assistant. If no one answers, please leave a voicemail as directed and we will return your call as soon as possible. Messages left after 4 pm will be answered the  following business day.   You may also send [place] a message via MyChart. We typically respond to MyChart messages within 1-2 business days.  For prescription refills, please ask your pharmacy to contact our office. Our fax number is [phone].  If you have an urgent issue when the clinic is closed that cannot wait until the next business day, you can page your doctor at the number below.    Please note that while we do our best to be available for urgent issues outside of office hours, we are not available 24/7.   If you have an urgent issue and are unable to reach [place], you may choose to seek medical care at your doctor's office, retail clinic, urgent care center, or emergency room.  If you have a medical emergency, please immediately call 911 or go to the emergency department.  Pager Numbers  - Dr. Gwen Pounds: [phone]  - Dr. Roseanne Reno: [phone]  In the event of inclement weather, please call our main line at [phone] for an update on the status of any delays or closures.  Dermatology Medication Tips: Please keep the boxes that topical medications come in in order to help keep track of the instructions about where and how to use these. Pharmacies typically print the medication instructions only on the boxes and not directly on the medication tubes.   If your medication is too expensive, please contact our office at [phone] option 4 or send [place] a message through MyChart.   We are unable to tell what your co-pay for medications will be in advance as this is different depending on your insurance coverage. However, we may be able to find a substitute medication at lower cost or fill out paperwork to get insurance to cover a needed medication.   If a prior authorization is required to get your medication covered by your insurance company, please allow [place] 1-2 business days to complete this process.  Drug prices often vary depending on where the prescription is filled and some  pharmacies may offer cheaper prices.  The website www.goodrx.com contains coupons for medications through different pharmacies. The prices here do not account for what the cost may be with help from insurance (it may be cheaper with your insurance), but the website can give you  the price if you did not use any insurance.  - You can print the associated coupon and take it with your prescription to the pharmacy.  - You may also stop by our office during regular business hours and pick up a GoodRx coupon card.  - If you need your prescription sent electronically to a different pharmacy, notify our office through [facility] or by phone at [phone] option 4.     Si Usted Necesita Algo Despus de Su Visita  Tambin puede enviarnos un mensaje a travs de Clinical cytogeneticist. Por lo general respondemos a los mensajes de MyChart en el transcurso de 1 a 2 das hbiles.  Para renovar recetas, por favor pida a su farmacia que se ponga en contacto con nuestra oficina. Annie Sable de fax es [place] [phone].  Si tiene un asunto urgente cuando la clnica est cerrada y que no puede esperar hasta el siguiente da hbil, puede llamar/localizar a su doctor(a) al nmero que aparece a continuacin.   Por favor, tenga en cuenta que aunque hacemos todo lo posible para estar disponibles para asuntos urgentes fuera del horario de [place], no estamos disponibles las 24 horas del da, los 7 [address]  [address] de la [place].   Si tiene un problema urgente y no puede comunicarse con nosotros, puede optar por buscar atencin mdica  en el consultorio de su doctor(a), en una clnica privada, en un centro de atencin urgente o en una sala de emergencias.  Si tiene Engineer, drilling, por favor llame inmediatamente al 911 o vaya a la sala de emergencias.  Nmeros de bper  - Dr. Gwen Pounds: [phone]  - Dra. Roseanne Reno: [phone]  En caso de inclemencias del [place], por favor llame a Lacy Duverney principal al [phone]  para una actualizacin sobre el [place] de cualquier retraso o cierre.  Consejos para la medicacin en dermatologa: Por favor, guarde las cajas en las que vienen los medicamentos de uso tpico para ayudarle a seguir las instrucciones sobre dnde y cmo usarlos. Las farmacias generalmente imprimen las instrucciones del medicamento slo en las cajas y no directamente en los tubos del [place].   Si su medicamento es muy caro, por favor, pngase en contacto con Rolm Gala llamando al [phone] y presione la opcin 4 o envenos un mensaje a travs de Clinical cytogeneticist.   No podemos decirle cul ser su copago por los medicamentos por adelantado ya que esto es diferente dependiendo de la cobertura de su seguro. Sin embargo, es posible que podamos encontrar un medicamento sustituto a Audiological scientist un formulario para que el seguro cubra el medicamento que se considera necesario.   Si se requiere una autorizacin previa para que su compaa de seguros [place] su medicamento, por favor permtanos de 1 a 2 das hbiles para completar [address]  Los precios de los medicamentos varan con frecuencia dependiendo del Environmental consultant de dnde se surte la receta y alguna farmacias pueden ofrecer precios ms baratos.  El sitio web www.goodrx.com tiene cupones para medicamentos de Health and safety inspector. Los precios aqu no tienen en cuenta lo que podra costar con la ayuda del seguro (puede ser ms barato con su seguro), pero el sitio web puede darle el precio si no utiliz Tourist information centre manager.  - Puede imprimir el cupn correspondiente y llevarlo con su receta a la farmacia.  - Tambin puede pasar por nuestra oficina durante el horario de atencin regular y Education officer, museum una tarjeta de cupones de GoodRx.  - Si necesita que su receta se enve electrnicamente  a [place] [employer], informe a nuestra oficina a travs de MyChart de [place] o por telfono llamando al [phone] y presione la opcin 4.

## 2022-12-11 NOTE — Progress Notes (Unsigned)
Follow-Up Visit   Subjective  Michael Gross is a 67 y.o. male who presents for the following: Skin Cancer Screening and Full Body Skin Exam Hx of bcc, hx of aks   wife is with patient and contributes to history.  The patient presents for Total-Body Skin Exam (TBSE) for skin cancer screening and mole check. The patient has spots, moles and lesions to be evaluated, some may be new or changing and the patient may have concern these could be cancer.   The following portions of the chart were reviewed this encounter and updated as appropriate: medications, allergies, medical history  Review of Systems:  No other skin or systemic complaints except as noted in HPI or Assessment and Plan.  Objective  Well appearing patient in no apparent distress; mood and affect are within normal limits.  A full examination was performed including scalp, head, eyes, ears, nose, lips, neck, chest, axillae, abdomen, back, buttocks, bilateral upper extremities, bilateral lower extremities, hands, feet, fingers, toes, fingernails, and toenails. All findings within normal limits unless otherwise noted below.   Relevant physical exam findings are noted in the Assessment and Plan.  right lateral lower eyelid margin x 1, right suprapubic area x 1 (2) Erythematous stuck-on, waxy papule or plaque  right forehead x 1, right ear x 1, nose x 1 (3) Erythematous thin papules/macules with gritty scale.   left lateral upper eyelid x 4 (4) - Fleshy, skin-colored pedunculated papules x 4   right lateral pectoral 0.6 cm irregular brown macule         Assessment & Plan   SKIN CANCER SCREENING PERFORMED TODAY.  EPIDERMAL INCLUSION CYST Exam: 1 cm subcutaneous nodule at right paraspinal back, and 1.5 cm subcutaneous nodule at low back spinal   Benign-appearing. Exam most consistent with an epidermal inclusion cyst. Discussed that a cyst is a benign growth that can grow over time and sometimes get irritated  or inflamed. Recommend observation if it is not bothersome. Discussed option of surgical excision to remove it if it is growing, symptomatic, or other changes noted. Please call for new or changing lesions so they can be evaluated.  ACTINIC DAMAGE - Chronic condition, secondary to cumulative UV/sun exposure - diffuse scaly erythematous macules with underlying dyspigmentation - Recommend daily broad spectrum sunscreen SPF 30+ to sun-exposed areas, reapply every 2 hours as needed.  - Staying in the shade or wearing long sleeves, sun glasses (UVA+UVB protection) and wide brim hats (4-inch brim around the entire circumference of the hat) are also recommended for sun protection.  - Call for new or changing lesions.  LENTIGINES, SEBORRHEIC KERATOSES, HEMANGIOMAS - Benign normal skin lesions - Benign-appearing - Call for any changes  Sk at right groin   MELANOCYTIC NEVI - Tan-brown and/or pink-flesh-colored symmetric macules and papules - Benign appearing on exam today - Observation - Call clinic for new or changing moles - Recommend daily use of broad spectrum spf 30+ sunscreen to sun-exposed areas.   Purpura - Chronic; persistent and recurrent.  Treatable, but not curable. - Violaceous macules and patches - Benign - Related to trauma, age, sun damage and/or use of blood thinners, chronic use of topical and/or oral steroids - Observe - Can use OTC arnica containing moisturizer such as Dermend Bruise Formula if desired - Call for worsening or other concerns  HISTORY OF BASAL CELL CARCINOMA OF THE SKIN Left cheek - No evidence of recurrence today - Recommend regular full body skin exams - Recommend daily broad spectrum  sunscreen SPF 30+ to sun-exposed areas, reapply every 2 hours as needed.  - Call if any new or changing lesions are noted between office visits   Inflamed seborrheic keratosis (2) right lateral lower eyelid margin x 1, right suprapubic area x 1  Symptomatic,  irritating, patient would like treated.  Destruction of lesion - right lateral lower eyelid margin x 1, right suprapubic area x 1 (2) Complexity: simple   Destruction method: cryotherapy   Informed consent: discussed and consent obtained   Timeout:  patient name, date of birth, surgical site, and procedure verified Lesion destroyed using liquid nitrogen: Yes   Region frozen until ice ball extended beyond lesion: Yes   Outcome: patient tolerated procedure well with no complications   Post-procedure details: wound care instructions given    Actinic keratosis (3) right forehead x 1, right ear x 1, nose x 1  Actinic keratoses are precancerous spots that appear secondary to cumulative UV radiation exposure/sun exposure over time. They are chronic with expected duration over 1 year. A portion of actinic keratoses will progress to squamous cell carcinoma of the skin. It is not possible to reliably predict which spots will progress to skin cancer and so treatment is recommended to prevent development of skin cancer.  Recommend daily broad spectrum sunscreen SPF 30+ to sun-exposed areas, reapply every 2 hours as needed.  Recommend staying in the shade or wearing long sleeves, sun glasses (UVA+UVB protection) and wide brim hats (4-inch brim around the entire circumference of the hat). Call for new or changing lesions.  Destruction of lesion - right forehead x 1, right ear x 1, nose x 1 (3) Complexity: simple   Destruction method: cryotherapy   Informed consent: discussed and consent obtained   Timeout:  patient name, date of birth, surgical site, and procedure verified Lesion destroyed using liquid nitrogen: Yes   Region frozen until ice ball extended beyond lesion: Yes   Outcome: patient tolerated procedure well with no complications   Post-procedure details: wound care instructions given    Acrochordon (4) left lateral upper eyelid x 4  Symptomatic, irritating, patient would like  treated.   - Benign appearing.  - Observe.     Destruction of lesion - left lateral upper eyelid x 4 (4) Complexity: simple   Destruction method: cryotherapy   Informed consent: discussed and consent obtained   Timeout:  patient name, date of birth, surgical site, and procedure verified Lesion destroyed using liquid nitrogen: Yes   Region frozen until ice ball extended beyond lesion: Yes   Outcome: patient tolerated procedure well with no complications   Post-procedure details: wound care instructions given    Neoplasm of uncertain behavior right lateral pectoral  Epidermal / dermal shaving  Lesion diameter (cm):  0.6 Informed consent: discussed and consent obtained   Timeout: patient name, date of birth, surgical site, and procedure verified   Procedure prep:  Patient was prepped and draped in usual sterile fashion Prep type:  Isopropyl alcohol Anesthesia: the lesion was anesthetized in a standard fashion   Anesthetic:  1% lidocaine w/ epinephrine 1-100,000 buffered w/ 8.4% NaHCO3 Instrument used: flexible razor blade   Hemostasis achieved with: pressure, aluminum chloride and electrodesiccation   Outcome: patient tolerated procedure well   Post-procedure details: sterile dressing applied and wound care instructions given   Dressing type: bandage and petrolatum    Specimen 1 - Surgical pathology Differential Diagnosis: Irregular nevus r/o dysplasia   Check Margins: No  Irregular nevus r/o dysplasia  Skin cancer screening  Actinic skin damage  Purpura (HCC)  Lentigo  Melanocytic nevus, unspecified location  Cyst of skin  History of basal cell carcinoma   Return for 4 month isk / skin tag recheck, 1 year tbse .  IAsher Muir, CMA, am acting as scribe for Armida Sans, MD.  Documentation: I have reviewed the above documentation for accuracy and completeness, and I agree with the above.  Armida Sans, MD

## 2022-12-12 ENCOUNTER — Encounter: Payer: Self-pay | Admitting: Dermatology

## 2022-12-15 ENCOUNTER — Ambulatory Visit: Payer: Medicare Other | Admitting: Urology

## 2022-12-15 VITALS — BP 117/69 | HR 71 | Ht 75.0 in | Wt 209.0 lb

## 2022-12-15 DIAGNOSIS — N5201 Erectile dysfunction due to arterial insufficiency: Secondary | ICD-10-CM | POA: Diagnosis not present

## 2022-12-15 DIAGNOSIS — N529 Male erectile dysfunction, unspecified: Secondary | ICD-10-CM

## 2022-12-15 DIAGNOSIS — N401 Enlarged prostate with lower urinary tract symptoms: Secondary | ICD-10-CM | POA: Diagnosis not present

## 2022-12-15 DIAGNOSIS — R338 Other retention of urine: Secondary | ICD-10-CM

## 2022-12-15 LAB — BLADDER SCAN AMB NON-IMAGING: Scan Result: 309

## 2022-12-15 NOTE — Progress Notes (Signed)
I, Maysun Anabel Bene, acting as a scribe for [name], MD., have documented all relevant documentation on the behalf of [name], MD, as directed by [name], MD while in the presence of [name], MD.  12/15/2022 12:21 PM   Michael Gross 1956-03-16 [id number]  Referring provider: Sallyanne Kuster, NP [address] [place],  [place] [address]  Chief Complaint  Patient presents with   Follow-up   Urologic history: 1. BPH with incomplete bladder emptying Tamsulosin  HPI: Michael Gross is a 67 y.o. male presents for 3 month follow-up.  Initially seen 2024 with severe low urinary tract symptoms and residual 450-600 cc's. Presently on tamsulosin 0.8 mg daily and his last PVR April 2024 was 313 mL.  Cystoscopy performed 09/15/22 showed moderate lateral lobe enlargement and TRUS prostate showed a 47 cc volume. Outlet procedures were discussed and he elected to continue medical management.  He states he felt he could have voided 3-4 more ounces of urine today but was rushed.   PMH: Past Medical History:  Diagnosis Date   Aortic atherosclerosis (HCC) 07/2022   noted on CXR   Arthritis    ankles   Basal cell carcinoma    BPH (benign prostatic hyperplasia)    Complication of anesthesia    slow to wake   Diabetes (HCC)    a. diagnosed in his 40's.   Diastolic dysfunction    a. 11/2019 Echo: EF 60-65%, no rwma, nl RV fxn, no significant valvular abnormality; b. 07/2022 Echo: EF 60-65%, no rwma, GrI DD, nl RV fxn, RVSP 29.80mmHg, AoV sclerosis w/o stenosis.   Hyperlipidemia    PAF (paroxysmal atrial fibrillation) (HCC)    a. 07/2022 noted in the setting of DKA - brief.  CHA2DS2VASc = 3-->OAC deferred for now; b. 10/2022 Zio: Predominantly sinus rhythm at 69 (49-171).  1 6 beat run of nonsustained VT.  8 brief runs of PSVT (fastest 8 beats).  Rare PAC and PVC.  Triggered event associated with sinus rhythm.  No atrial fibrillation.   Vertigo    none for  over 15 years    Surgical History: Past Surgical History:  Procedure Laterality Date   COLONOSCOPY WITH PROPOFOL N/A 07/27/2022   Procedure: COLONOSCOPY WITH PROPOFOL;  Surgeon: Toney Reil, MD;  Location: [facility];  Service: Endoscopy;  Laterality: N/A;  Diabetic   HERNIA REPAIR     2003   KNEE SURGERY     1998   MOHS SURGERY     2003   SHOULDER ARTHROSCOPY WITH OPEN ROTATOR CUFF REPAIR AND DISTAL CLAVICLE ACROMINECTOMY Left 08/11/2021   Procedure: SHOULDER ARTHROSCOPY WITH OPEN ROTATOR CUFF REPAIR AND DISTAL CLAVICLE ACROMINECTOMY;  Surgeon: Juanell Fairly, MD;  Location: [facility];  Service: Orthopedics;  Laterality: Left;    Home Medications:  Allergies as of 12/15/2022   No Known Allergies      Medication List        Accurate as of December 15, 2022 12:21 PM. If you have any questions, ask your nurse or doctor.          aspirin EC 81 MG tablet Take 1 tablet (81 mg total) by mouth daily. Swallow whole.   BD Pen Needle Nano U/F 32G X 4 MM Misc Generic drug: Insulin Pen Needle Use as directed with SOLIQUA to inject insulin every morning E11.65   empagliflozin 25 MG Tabs tablet Commonly known as: Jardiance Take 1 tablet (25 mg total) by mouth  daily.   ezetimibe 10 MG tablet Commonly known as: Zetia Take 1 tablet (10 mg total) by mouth daily.   insulin lispro 100 UNIT/ML KwikPen Commonly known as: HUMALOG INJECT INSULIN DOSE INTO THE SKIN BEFORE MEALS PER SLIDING SCALE. MAX DOSE PER 24 HOURS: 42 UNITS.   Lancets 30G Misc Use to check blood sugars 5 times per day   Lantus SoloStar 100 UNIT/ML Solostar Pen Generic drug: insulin glargine Inject 30 Units into the skin at bedtime.   meloxicam 15 MG tablet Commonly known as: MOBIC Take by mouth.   metoprolol succinate 25 MG 24 hr tablet Commonly known as: TOPROL-XL Take 1 tablet (25 mg total) by mouth daily.   multivitamin with minerals Tabs tablet Take 1 tablet by mouth daily.    pantoprazole 40 MG tablet Commonly known as: PROTONIX TAKE 1 TABLET (40 MG TOTAL) BY MOUTH TWICE A DAY BEFORE MEALS   pregabalin 25 MG capsule Commonly known as: LYRICA Take 1 capsule (25 mg total) by mouth 2 (two) times daily.   rosuvastatin 40 MG tablet Commonly known as: CRESTOR TAKE 1 TABLET BY MOUTH EVERY DAY   tamsulosin 0.4 MG Caps capsule Commonly known as: FLOMAX Take 2 capsules (0.8 mg total) by mouth daily.   VITAMIN B-12 PO Take by mouth.   VITAMIN C PO Take by mouth.   VITAMIN D3 PO Take by mouth.        Allergies: No Known Allergies  Family History: Family History  Problem Relation Age of Onset   Heart disease Father    Prostate cancer Neg Hx    Kidney cancer Neg Hx    Bladder Cancer Neg Hx     Social History:  reports that he has never smoked. He has never used smokeless tobacco. He reports current alcohol use. He reports that he does not use drugs.   Physical Exam: BP 117/69   Pulse 71   Ht 6\' 3"  (1.905 m)   Wt 209 lb (94.8 kg)   BMI 26.12 kg/m   Constitutional:  Alert and oriented, No acute distress. HEENT: [place] AT, moist mucus membranes.  Trachea midline, no masses. Cardiovascular: No clubbing, cyanosis, or edema. Respiratory: Normal respiratory effort, no increased work of breathing. GI: Abdomen is soft, nontender, nondistended, no abdominal masses Skin: No rashes, bruises or suspicious lesions. Neurologic: Grossly intact, no focal deficits, moving all 4 extremities. Psychiatric: Normal mood and affect.   Assessment & Plan:    1. BPH with incomplete bladder emptying PVR today stable at 309 mL; patient felt he could have emptied even better.  He desires to continue medical management.  6 month follow-up with PVR.   2. Erectile dysfunction Had previously failed PDE5 inhibitors and intracavernosal injection.  He inquired if there was any new treatments, and he had several questions about penile implant surgery, which were answered.   If he desires to pursue penile implant, will refer to [facility] in [place].  [facility] [address], [address] [place], [place] [address] [phone]

## 2022-12-16 ENCOUNTER — Encounter: Payer: Self-pay | Admitting: Urology

## 2022-12-19 ENCOUNTER — Ambulatory Visit: Payer: Medicare Other | Admitting: Physician Assistant

## 2022-12-19 ENCOUNTER — Telehealth: Payer: Self-pay

## 2022-12-19 NOTE — Telephone Encounter (Signed)
Patient advised of BX results .aw 

## 2022-12-19 NOTE — Telephone Encounter (Signed)
-----   Message from Armida Sans sent at 12/18/2022  6:04 PM EDT ----- Diagnosis Skin , right lateral pectoral DYSPLASTIC COMPOUND NEVUS WITH MODERATE ATYPIA, PERIPHERAL AND DEEP MARGINS INVOLVED  Moderate dysplastic Recheck next visit

## 2022-12-19 NOTE — Telephone Encounter (Signed)
Left pt msg to call for bx result/sh °

## 2023-01-14 ENCOUNTER — Other Ambulatory Visit: Payer: Self-pay | Admitting: Nurse Practitioner

## 2023-01-14 DIAGNOSIS — N401 Enlarged prostate with lower urinary tract symptoms: Secondary | ICD-10-CM

## 2023-01-25 ENCOUNTER — Ambulatory Visit (INDEPENDENT_AMBULATORY_CARE_PROVIDER_SITE_OTHER): Payer: Medicare Other | Admitting: Nurse Practitioner

## 2023-01-25 ENCOUNTER — Encounter: Payer: Self-pay | Admitting: Nurse Practitioner

## 2023-01-25 VITALS — BP 124/72 | HR 70 | Temp 98.3°F | Resp 16 | Ht 75.0 in | Wt 217.8 lb

## 2023-01-25 DIAGNOSIS — E1165 Type 2 diabetes mellitus with hyperglycemia: Secondary | ICD-10-CM

## 2023-01-25 DIAGNOSIS — E1140 Type 2 diabetes mellitus with diabetic neuropathy, unspecified: Secondary | ICD-10-CM

## 2023-01-25 DIAGNOSIS — Z79899 Other long term (current) drug therapy: Secondary | ICD-10-CM | POA: Diagnosis not present

## 2023-01-25 DIAGNOSIS — Z794 Long term (current) use of insulin: Secondary | ICD-10-CM

## 2023-01-25 DIAGNOSIS — E785 Hyperlipidemia, unspecified: Secondary | ICD-10-CM

## 2023-01-25 DIAGNOSIS — Z125 Encounter for screening for malignant neoplasm of prostate: Secondary | ICD-10-CM | POA: Diagnosis not present

## 2023-01-25 DIAGNOSIS — E782 Mixed hyperlipidemia: Secondary | ICD-10-CM

## 2023-01-25 DIAGNOSIS — E1169 Type 2 diabetes mellitus with other specified complication: Secondary | ICD-10-CM | POA: Diagnosis not present

## 2023-01-25 MED ORDER — ROSUVASTATIN CALCIUM 40 MG PO TABS: 40.0000 mg | ORAL_TABLET | Freq: Every day | ORAL | 3 refills | Status: DC

## 2023-01-25 MED ORDER — MELOXICAM 15 MG PO TABS: 15.0000 mg | ORAL_TABLET | Freq: Every day | ORAL | 1 refills | Status: DC

## 2023-01-25 MED ORDER — PANTOPRAZOLE SODIUM 40 MG PO TBEC
40.0000 mg | DELAYED_RELEASE_TABLET | Freq: Two times a day (BID) | ORAL | 1 refills | Status: DC
Start: 2023-01-25 — End: 2023-07-04

## 2023-01-25 MED ORDER — LANTUS SOLOSTAR 100 UNIT/ML ~~LOC~~ SOPN
34.0000 [IU] | PEN_INJECTOR | Freq: Every day | SUBCUTANEOUS | Status: DC
Start: 2023-01-25 — End: 2023-03-20

## 2023-01-25 MED ORDER — EZETIMIBE 10 MG PO TABS: 10.0000 mg | ORAL_TABLET | Freq: Every day | ORAL | 1 refills | Status: DC

## 2023-01-25 MED ORDER — PREGABALIN 25 MG PO CAPS
25.0000 mg | ORAL_CAPSULE | Freq: Two times a day (BID) | ORAL | 2 refills | Status: DC
Start: 2023-01-25 — End: 2023-04-16

## 2023-01-25 NOTE — Progress Notes (Unsigned)
[facility] [address] [place], [place] [address]  Internal MEDICINE  Office Visit Note  Patient Name: Michael Gross  [id number]  [id number]  Date of Service: 01/25/2023  Chief Complaint  Patient presents with  . Diabetes  . Hyperlipidemia  . Follow-up    HPI Dakarie presents for a follow-up visit for  Diabetes --      Current Medication: Outpatient Encounter Medications as of 01/25/2023  Medication Sig  . Ascorbic Acid (VITAMIN C PO) Take by mouth.  Marland Kitchen aspirin EC 81 MG tablet Take 1 tablet (81 mg total) by mouth daily. Swallow whole.  . Cholecalciferol (VITAMIN D3 PO) Take by mouth.  . Cyanocobalamin (VITAMIN B-12 PO) Take by mouth.  . empagliflozin (JARDIANCE) 25 MG TABS tablet Take 1 tablet (25 mg total) by mouth daily.  Marland Kitchen ezetimibe (ZETIA) 10 MG tablet Take 1 tablet (10 mg total) by mouth daily.  . insulin glargine (LANTUS SOLOSTAR) 100 UNIT/ML Solostar Pen Inject 30 Units into the skin at bedtime.  . insulin lispro (HUMALOG) 100 UNIT/ML KwikPen INJECT INSULIN DOSE INTO THE SKIN BEFORE MEALS PER SLIDING SCALE. MAX DOSE PER 24 HOURS: 42 UNITS.  Marland Kitchen Insulin Pen Needle (BD PEN NEEDLE NANO U/F) 32G X 4 MM MISC Use as directed with SOLIQUA to inject insulin every morning E11.65  . Lancets 30G MISC Use to check blood sugars 5 times per day  . meloxicam (MOBIC) 15 MG tablet Take by mouth.  . metoprolol succinate (TOPROL-XL) 25 MG 24 hr tablet Take 1 tablet (25 mg total) by mouth daily.  . Multiple Vitamin (MULTIVITAMIN WITH MINERALS) TABS tablet Take 1 tablet by mouth daily.  . pantoprazole (PROTONIX) 40 MG tablet TAKE 1 TABLET (40 MG TOTAL) BY MOUTH TWICE A DAY BEFORE MEALS  . pregabalin (LYRICA) 25 MG capsule Take 1 capsule (25 mg total) by mouth 2 (two) times daily.  . rosuvastatin (CRESTOR) 40 MG tablet TAKE 1 TABLET BY MOUTH EVERY DAY  . tamsulosin (FLOMAX) 0.4 MG CAPS capsule TAKE 2 CAPSULES BY MOUTH EVERY DAY   No facility-administered encounter  medications on file as of 01/25/2023.    Surgical History: Past Surgical History:  Procedure Laterality Date  . COLONOSCOPY WITH PROPOFOL N/A 07/27/2022   Procedure: COLONOSCOPY WITH PROPOFOL;  Surgeon: Toney Reil, MD;  Location: [facility];  Service: Endoscopy;  Laterality: N/A;  Diabetic  . HERNIA REPAIR     2003  . KNEE SURGERY     1998  . MOHS SURGERY     2003  . SHOULDER ARTHROSCOPY WITH OPEN ROTATOR CUFF REPAIR AND DISTAL CLAVICLE ACROMINECTOMY Left 08/11/2021   Procedure: SHOULDER ARTHROSCOPY WITH OPEN ROTATOR CUFF REPAIR AND DISTAL CLAVICLE ACROMINECTOMY;  Surgeon: Juanell Fairly, MD;  Location: [facility];  Service: Orthopedics;  Laterality: Left;    Medical History: Past Medical History:  Diagnosis Date  . Aortic atherosclerosis (HCC) 07/2022   noted on CXR  . Arthritis    ankles  . Basal cell carcinoma   . BPH (benign prostatic hyperplasia)   . Complication of anesthesia    slow to wake  . Diabetes (HCC)    a. diagnosed in his 25's.  . Diastolic dysfunction    a. 11/2019 Echo: EF 60-65%, no rwma, nl RV fxn, no significant valvular abnormality; b. 07/2022 Echo: EF 60-65%, no rwma, GrI DD, nl RV fxn, RVSP 29.68mmHg, AoV sclerosis w/o stenosis.  Marland Kitchen Dysplastic nevus 12/11/2022   right lateral pectoral, moderate atypia  . Hyperlipidemia   .  PAF (paroxysmal atrial fibrillation) (HCC)    a. 07/2022 noted in the setting of DKA - brief.  CHA2DS2VASc = 3-->OAC deferred for now; b. 10/2022 Zio: Predominantly sinus rhythm at 69 (49-171).  1 6 beat run of nonsustained VT.  8 brief runs of PSVT (fastest 8 beats).  Rare PAC and PVC.  Triggered event associated with sinus rhythm.  No atrial fibrillation.  . Vertigo    none for over 15 years    Family History: Family History  Problem Relation Age of Onset  . Heart disease Father   . Prostate cancer Neg Hx   . Kidney cancer Neg Hx   . Bladder Cancer Neg Hx     Social History   Socioeconomic History  . Marital  status: Married    Spouse name: Hilda Lias  . Number of children: 4  . Years of education: Not on file  . Highest education level: Not on file  Occupational History  . Not on file  Tobacco Use  . Smoking status: Never  . Smokeless tobacco: Never  Vaping Use  . Vaping status: Never Used  Substance and Sexual Activity  . Alcohol use: Yes    Comment: rare  . Drug use: No  . Sexual activity: Yes  Other Topics Concern  . Not on file  Social History Narrative  . Not on file   Social Determinants of Health   Financial Resource Strain: Not on file  Food Insecurity: No Food Insecurity (08/01/2022)   Hunger Vital Sign   . Worried About Programme researcher, broadcasting/film/video in the Last Year: Never true   . Ran Out of Food in the Last Year: Never true  Transportation Needs: No Transportation Needs (08/01/2022)   PRAPARE - Transportation   . Lack of Transportation (Medical): No   . Lack of Transportation (Non-Medical): No  Physical Activity: Not on file  Stress: Not on file  Social Connections: Not on file  Intimate Partner Violence: Not At Risk (08/01/2022)   Humiliation, Afraid, Rape, and Kick questionnaire   . Fear of Current or Ex-Partner: No   . Emotionally Abused: No   . Physically Abused: No   . Sexually Abused: No      Review of Systems  Vital Signs: BP 124/72   Pulse 70   Temp 98.3 F (36.8 C)   Resp 16   Ht 6\' 3"  (1.905 m)   Wt 217 lb 12.8 oz (98.8 kg)   SpO2 94%   BMI 27.22 kg/m    Physical Exam     Assessment/Plan: 1. Type 2 diabetes mellitus with other specified complication, with long-term current use of insulin (HCC)  Continue current medications and insulin dosage as ordered and discussed  - CBC with Differential/Platelet - CMP14+EGFR - Lipid Profile - Hgb A1C w/o eAG  2. Neuropathy due to type 2 diabetes mellitus (HCC) Continue pregabalin as prescribed.  - pregabalin (LYRICA) 25 MG capsule; Take 1 capsule (25 mg total) by mouth 2 (two) times daily.  Dispense: 60  capsule; Refill: 2  3. Hyperlipidemia associated with type 2 diabetes mellitus (HCC) Continue rosuvastatin as prescribed. Routine labs ordered  - rosuvastatin (CRESTOR) 40 MG tablet; Take 1 tablet (40 mg total) by mouth daily.  Dispense: 90 tablet; Refill: 3 - CBC with Differential/Platelet - CMP14+EGFR - Lipid Profile - Hgb A1C w/o eAG  4. Screening for prostate cancer Routine psa ordered  - PSA Total (Reflex To Free)  5. Encounter for medication review Medication list reviewed, updated,  and refills ordered  - pantoprazole (PROTONIX) 40 MG tablet; Take 1 tablet (40 mg total) by mouth 2 (two) times daily.  Dispense: 180 tablet; Refill: 1 - ezetimibe (ZETIA) 10 MG tablet; Take 1 tablet (10 mg total) by mouth daily.  Dispense: 90 tablet; Refill: 1 - meloxicam (MOBIC) 15 MG tablet; Take 1 tablet (15 mg total) by mouth daily.  Dispense: 90 tablet; Refill: 1   General Counseling: shaheed koerber understanding of the findings of todays visit and agrees with plan of treatment. I have discussed any further diagnostic evaluation that may be needed or ordered today. We also reviewed his medications today. he has been encouraged to call the office with any questions or concerns that should arise related to todays visit.    No orders of the defined types were placed in this encounter.   No orders of the defined types were placed in this encounter.   No follow-ups on file.   Total time spent:*** Minutes Time spent includes review of chart, medications, test results, and follow up plan with the patient.   [place] Controlled Substance Database was reviewed by me.  This patient was seen by Sallyanne Kuster, FNP-C in collaboration with Dr. Beverely Risen as a part of collaborative care agreement.   Ector Laurel R. Tedd Sias, MSN, FNP-C Internal medicine

## 2023-02-20 ENCOUNTER — Other Ambulatory Visit: Payer: Self-pay | Admitting: Nurse Practitioner

## 2023-02-20 DIAGNOSIS — E1165 Type 2 diabetes mellitus with hyperglycemia: Secondary | ICD-10-CM

## 2023-03-20 ENCOUNTER — Other Ambulatory Visit: Payer: Self-pay

## 2023-03-20 DIAGNOSIS — E1165 Type 2 diabetes mellitus with hyperglycemia: Secondary | ICD-10-CM

## 2023-03-20 MED ORDER — LANTUS SOLOSTAR 100 UNIT/ML ~~LOC~~ SOPN: 34.0000 [IU] | PEN_INJECTOR | Freq: Every day | SUBCUTANEOUS | Status: DC

## 2023-03-20 MED ORDER — INSULIN LISPRO (1 UNIT DIAL) 100 UNIT/ML (KWIKPEN): PEN_INJECTOR | SUBCUTANEOUS | 2 refills | Status: DC

## 2023-03-21 DIAGNOSIS — E1140 Type 2 diabetes mellitus with diabetic neuropathy, unspecified: Secondary | ICD-10-CM | POA: Insufficient documentation

## 2023-03-22 ENCOUNTER — Other Ambulatory Visit: Payer: Self-pay

## 2023-03-22 ENCOUNTER — Encounter: Payer: Self-pay | Admitting: Nurse Practitioner

## 2023-03-22 DIAGNOSIS — E1165 Type 2 diabetes mellitus with hyperglycemia: Secondary | ICD-10-CM

## 2023-03-22 MED ORDER — LANTUS SOLOSTAR 100 UNIT/ML ~~LOC~~ SOPN: 34.0000 [IU] | PEN_INJECTOR | Freq: Every day | SUBCUTANEOUS | 3 refills | Status: DC

## 2023-03-23 ENCOUNTER — Other Ambulatory Visit: Payer: Self-pay | Admitting: Nurse Practitioner

## 2023-03-23 DIAGNOSIS — E1165 Type 2 diabetes mellitus with hyperglycemia: Secondary | ICD-10-CM

## 2023-04-12 LAB — CBC WITH DIFFERENTIAL/PLATELET
Basophils Absolute: 0.1 10*3/uL (ref 0.0–0.2)
Basos: 1 %
EOS (ABSOLUTE): 0.1 10*3/uL (ref 0.0–0.4)
Eos: 2 %
Hematocrit: 46.4 % (ref 37.5–51.0)
Hemoglobin: 13.7 g/dL (ref 13.0–17.7)
Immature Grans (Abs): 0.1 10*3/uL (ref 0.0–0.1)
Immature Granulocytes: 1 %
Lymphocytes Absolute: 2.8 10*3/uL (ref 0.7–3.1)
Lymphs: 42 %
MCH: 23.3 pg — ABNORMAL LOW (ref 26.6–33.0)
MCHC: 29.5 g/dL — ABNORMAL LOW (ref 31.5–35.7)
MCV: 79 fL (ref 79–97)
Monocytes Absolute: 0.4 10*3/uL (ref 0.1–0.9)
Monocytes: 6 %
Neutrophils Absolute: 3.1 10*3/uL (ref 1.4–7.0)
Neutrophils: 48 %
Platelets: 213 10*3/uL (ref 150–450)
RBC: 5.89 x10E6/uL — ABNORMAL HIGH (ref 4.14–5.80)
RDW: 17.2 % — ABNORMAL HIGH (ref 11.6–15.4)
WBC: 6.5 10*3/uL (ref 3.4–10.8)

## 2023-04-12 LAB — CMP14+EGFR
ALT: 24 [IU]/L (ref 0–44)
AST: 26 [IU]/L (ref 0–40)
Albumin: 4.1 g/dL (ref 3.9–4.9)
Alkaline Phosphatase: 70 [IU]/L (ref 44–121)
BUN/Creatinine Ratio: 18 (ref 10–24)
BUN: 15 mg/dL (ref 8–27)
Bilirubin Total: 0.4 mg/dL (ref 0.0–1.2)
CO2: 24 mmol/L (ref 20–29)
Calcium: 9.8 mg/dL (ref 8.6–10.2)
Chloride: 105 mmol/L (ref 96–106)
Creatinine, Ser: 0.82 mg/dL (ref 0.76–1.27)
Globulin, Total: 2.5 g/dL (ref 1.5–4.5)
Glucose: 146 mg/dL — ABNORMAL HIGH (ref 70–99)
Potassium: 5 mmol/L (ref 3.5–5.2)
Sodium: 142 mmol/L (ref 134–144)
Total Protein: 6.6 g/dL (ref 6.0–8.5)
eGFR: 96 mL/min/{1.73_m2} (ref >59)

## 2023-04-12 LAB — HGB A1C W/O EAG: Hgb A1c MFr Bld: 9.6 % — ABNORMAL HIGH (ref 4.8–5.6)

## 2023-04-12 LAB — LIPID PANEL
Chol/HDL Ratio: 2.3 {ratio} (ref 0.0–5.0)
Cholesterol, Total: 106 mg/dL (ref 100–199)
HDL: 47 mg/dL (ref >39)
LDL Chol Calc (NIH): 44 mg/dL (ref 0–99)
Triglycerides: 74 mg/dL (ref 0–149)
VLDL Cholesterol Cal: 15 mg/dL (ref 5–40)

## 2023-04-12 LAB — PSA TOTAL (REFLEX TO FREE): Prostate Specific Ag, Serum: 3.1 ng/mL (ref 0.0–4.0)

## 2023-04-12 NOTE — Progress Notes (Signed)
Will discuss lab results at office visit next week.

## 2023-04-16 ENCOUNTER — Other Ambulatory Visit: Payer: Self-pay

## 2023-04-16 ENCOUNTER — Telehealth: Payer: Self-pay

## 2023-04-16 DIAGNOSIS — Z79899 Other long term (current) drug therapy: Secondary | ICD-10-CM

## 2023-04-16 DIAGNOSIS — E1140 Type 2 diabetes mellitus with diabetic neuropathy, unspecified: Secondary | ICD-10-CM

## 2023-04-16 MED ORDER — PREGABALIN 25 MG PO CAPS: 25.0000 mg | ORAL_CAPSULE | Freq: Two times a day (BID) | ORAL | 2 refills | Status: DC

## 2023-04-16 MED ORDER — MELOXICAM 15 MG PO TABS: 15.0000 mg | ORAL_TABLET | Freq: Every day | ORAL | 1 refills | Status: DC

## 2023-04-16 NOTE — Telephone Encounter (Signed)
Patient notified

## 2023-04-19 ENCOUNTER — Encounter: Payer: Self-pay | Admitting: Nurse Practitioner

## 2023-04-19 ENCOUNTER — Ambulatory Visit (INDEPENDENT_AMBULATORY_CARE_PROVIDER_SITE_OTHER): Payer: Medicare Other | Admitting: Nurse Practitioner

## 2023-04-19 VITALS — BP 135/72 | HR 95 | Temp 98.5°F | Resp 16 | Ht 75.0 in | Wt 222.2 lb

## 2023-04-19 DIAGNOSIS — Z0000 Encounter for general adult medical examination without abnormal findings: Secondary | ICD-10-CM | POA: Diagnosis not present

## 2023-04-19 DIAGNOSIS — E1140 Type 2 diabetes mellitus with diabetic neuropathy, unspecified: Secondary | ICD-10-CM | POA: Diagnosis not present

## 2023-04-19 DIAGNOSIS — I480 Paroxysmal atrial fibrillation: Secondary | ICD-10-CM | POA: Diagnosis not present

## 2023-04-19 DIAGNOSIS — E1169 Type 2 diabetes mellitus with other specified complication: Secondary | ICD-10-CM

## 2023-04-19 DIAGNOSIS — R7989 Other specified abnormal findings of blood chemistry: Secondary | ICD-10-CM

## 2023-04-19 DIAGNOSIS — D751 Secondary polycythemia: Secondary | ICD-10-CM

## 2023-04-19 DIAGNOSIS — E785 Hyperlipidemia, unspecified: Secondary | ICD-10-CM

## 2023-04-19 DIAGNOSIS — N529 Male erectile dysfunction, unspecified: Secondary | ICD-10-CM

## 2023-04-19 DIAGNOSIS — I152 Hypertension secondary to endocrine disorders: Secondary | ICD-10-CM

## 2023-04-19 DIAGNOSIS — E1159 Type 2 diabetes mellitus with other circulatory complications: Secondary | ICD-10-CM | POA: Diagnosis not present

## 2023-04-19 DIAGNOSIS — Z794 Long term (current) use of insulin: Secondary | ICD-10-CM

## 2023-04-19 NOTE — Progress Notes (Signed)
[facility] [address] [place], [place] [address]  Internal MEDICINE  Office Visit Note  Patient Name: Michael Gross  [id number]  [id number]  Date of Service: 04/19/2023  Chief Complaint  Patient presents with   Diabetes   Hyperlipidemia   Medicare Wellness    HPI Michael Gross presents for an annual well visit and physical exam.  Well-appearing 67 y.o. male with diabetes, hypertension, high cholesterol, and neuropathy.  Routine CRC screening: due in march 2025 Eye exam: overdue, does not currently have an eye doctor, needs referral  foot exam: done  Labs: reviewed lab results with patient. Has elevated RBC, low MCV, low MCH, high RDW, and a history of mild anemia.  New or worsening pain: none  A1c elevated at 9.6, not currently on ozempic, plans to restart ozempic in January when it will be slightly more affordable. Cholesterol levels are normal on rosuvastatin Taking metoprolol for afib and hypertension.  Reports that he has been taking 26-28 units of insulin lispro for meal times when glucose is between 150-200. High concern for severe hypoglycemia with this practice which is not how the medication was prescribed. He admits to not following the prescription instructions and the sliding scale that he was provided. He is also not following a diabetic diet and is eating high carbohydrate and high sugar foods which is spiking his sugar and rendering the smaller doses of the insulin lispro on the sliding scale ineffective.        04/19/2023    3:26 PM 04/13/2022    3:09 PM  MMSE - Mini Mental State Exam  Orientation to time 5 5  Orientation to Place 5 5  Registration 3 3  Attention/ Calculation 5 5  Recall 3 3  Language- name 2 objects 2 2  Language- repeat 1 1  Language- follow 3 step command 3 3  Language- read & follow direction 1 1  Write a sentence 1 1  Copy design 1 1  Total score 30 30    Functional Status Survey: Is the patient deaf or have  difficulty hearing?: No Does the patient have difficulty seeing, even when wearing glasses/contacts?: No Does the patient have difficulty concentrating, remembering, or making decisions?: No Does the patient have difficulty walking or climbing stairs?: No Does the patient have difficulty dressing or bathing?: No Does the patient have difficulty doing errands alone such as visiting a doctor's office or shopping?: No     07/06/2021    8:34 AM 09/21/2021    9:09 AM 04/13/2022    3:07 PM 07/13/2022    8:46 AM 04/19/2023    3:25 PM  Fall Risk  Falls in the past year? 1 1 1  0 0  Was there an injury with Fall? 1 1 1  0 0  Fall Risk Category Calculator 2 2 2  0 0  Fall Risk Category (Retired) Moderate Moderate Moderate    (RETIRED) Patient Fall Risk Level Moderate fall risk Moderate fall risk Moderate fall risk    Patient at Risk for Falls Due to    No Fall Risks No Fall Risks  Fall risk Follow up Falls evaluation completed Falls evaluation completed Falls evaluation completed Falls evaluation completed Falls evaluation completed       04/19/2023    3:25 PM  Depression screen PHQ 2/9  Decreased Interest 0  Down, Depressed, Hopeless 0  PHQ - 2 Score 0       Current Medication: Outpatient Encounter Medications as of 04/19/2023  Medication  Sig   Ascorbic Acid (VITAMIN C PO) Take by mouth.   aspirin EC 81 MG tablet Take 1 tablet (81 mg total) by mouth daily. Swallow whole.   Cholecalciferol (VITAMIN D3 PO) Take by mouth.   Cyanocobalamin (VITAMIN B-12 PO) Take by mouth.   ezetimibe (ZETIA) 10 MG tablet Take 1 tablet (10 mg total) by mouth daily.   insulin glargine (LANTUS SOLOSTAR) 100 UNIT/ML Solostar Pen Inject 34 Units into the skin at bedtime.   insulin lispro (HUMALOG) 100 UNIT/ML KwikPen Inject insulin dose into the skin before meals per sliding scale. Max dose per 24 hours: 42 units.   Insulin Pen Needle (BD PEN NEEDLE NANO U/F) 32G X 4 MM MISC Use as directed with SOLIQUA to inject  insulin every morning E11.65   JARDIANCE 25 MG TABS tablet TAKE 1 TABLET (25 MG TOTAL) BY MOUTH DAILY.   Lancets 30G MISC Use to check blood sugars 5 times per day   meloxicam (MOBIC) 15 MG tablet Take 1 tablet (15 mg total) by mouth daily.   metoprolol succinate (TOPROL-XL) 25 MG 24 hr tablet TAKE 1 TABLET (25 MG TOTAL) BY MOUTH DAILY.   Multiple Vitamin (MULTIVITAMIN WITH MINERALS) TABS tablet Take 1 tablet by mouth daily.   pantoprazole (PROTONIX) 40 MG tablet Take 1 tablet (40 mg total) by mouth 2 (two) times daily.   pregabalin (LYRICA) 25 MG capsule Take 1 capsule (25 mg total) by mouth 2 (two) times daily.   rosuvastatin (CRESTOR) 40 MG tablet Take 1 tablet (40 mg total) by mouth daily.   tamsulosin (FLOMAX) 0.4 MG CAPS capsule TAKE 2 CAPSULES BY MOUTH EVERY DAY   No facility-administered encounter medications on file as of 04/19/2023.    Surgical History: Past Surgical History:  Procedure Laterality Date   COLONOSCOPY WITH PROPOFOL N/A 07/27/2022   Procedure: COLONOSCOPY WITH PROPOFOL;  Surgeon: Toney Reil, MD;  Location: [facility];  Service: Endoscopy;  Laterality: N/A;  Diabetic   HERNIA REPAIR     2003   KNEE SURGERY     1998   MOHS SURGERY     2003   SHOULDER ARTHROSCOPY WITH OPEN ROTATOR CUFF REPAIR AND DISTAL CLAVICLE ACROMINECTOMY Left 08/11/2021   Procedure: SHOULDER ARTHROSCOPY WITH OPEN ROTATOR CUFF REPAIR AND DISTAL CLAVICLE ACROMINECTOMY;  Surgeon: Juanell Fairly, MD;  Location: [facility];  Service: Orthopedics;  Laterality: Left;    Medical History: Past Medical History:  Diagnosis Date   Aortic atherosclerosis (HCC) 07/2022   noted on CXR   Arthritis    ankles   Basal cell carcinoma    BPH (benign prostatic hyperplasia)    Complication of anesthesia    slow to wake   Diabetes (HCC)    a. diagnosed in his 40's.   Diastolic dysfunction    a. 11/2019 Echo: EF 60-65%, no rwma, nl RV fxn, no significant valvular abnormality; b. 07/2022 Echo:  EF 60-65%, no rwma, GrI DD, nl RV fxn, RVSP 29.2mmHg, AoV sclerosis w/o stenosis.   Dysplastic nevus 12/11/2022   right lateral pectoral, moderate atypia   Hyperlipidemia    PAF (paroxysmal atrial fibrillation) (HCC)    a. 07/2022 noted in the setting of DKA - brief.  CHA2DS2VASc = 3-->OAC deferred for now; b. 10/2022 Zio: Predominantly sinus rhythm at 69 (49-171).  1 6 beat run of nonsustained VT.  8 brief runs of PSVT (fastest 8 beats).  Rare PAC and PVC.  Triggered event associated with sinus rhythm.  No atrial fibrillation.  Vertigo    none for over 15 years    Family History: Family History  Problem Relation Age of Onset   Heart disease Father    Prostate cancer Neg Hx    Kidney cancer Neg Hx    Bladder Cancer Neg Hx     Social History   Socioeconomic History   Marital status: Married    Spouse name: Michael Gross   Number of children: 4   Years of education: Not on file   Highest education level: Not on file  Occupational History   Not on file  Tobacco Use   Smoking status: Never   Smokeless tobacco: Never  Vaping Use   Vaping status: Never Used  Substance and Sexual Activity   Alcohol use: Yes    Comment: rare   Drug use: No   Sexual activity: Yes  Other Topics Concern   Not on file  Social History Narrative   Not on file   Social Drivers of Health   Financial Resource Strain: Not on file  Food Insecurity: No Food Insecurity (08/01/2022)   Hunger Vital Sign    Worried About Running Out of Food in the Last Year: Never true    Ran Out of Food in the Last Year: Never true  Transportation Needs: No Transportation Needs (08/01/2022)   PRAPARE - Administrator, Civil Service (Medical): No    Lack of Transportation (Non-Medical): No  Physical Activity: Not on file  Stress: Not on file  Social Connections: Not on file  Intimate Partner Violence: Not At Risk (08/01/2022)   Humiliation, Afraid, Rape, and Kick questionnaire    Fear of Current or Ex-Partner: No     Emotionally Abused: No    Physically Abused: No    Sexually Abused: No      Review of Systems  Constitutional:  Negative for activity change, appetite change, chills, fatigue, fever and unexpected weight change.  HENT: Negative.  Negative for congestion, ear pain, rhinorrhea, sore throat and trouble swallowing.   Eyes: Negative.   Respiratory: Negative.  Negative for cough, chest tightness, shortness of breath and wheezing.   Cardiovascular: Negative.  Negative for chest pain.  Gastrointestinal: Negative.  Negative for abdominal pain, blood in stool, constipation, diarrhea, nausea and vomiting.  Endocrine: Negative.   Genitourinary: Negative.  Negative for difficulty urinating, dysuria, frequency, hematuria and urgency.  Musculoskeletal:  Positive for arthralgias and gait problem. Negative for back pain, joint swelling, myalgias and neck pain.  Skin: Negative.  Negative for rash and wound.  Allergic/Immunologic: Negative.  Negative for immunocompromised state.  Neurological:  Negative for dizziness, seizures, numbness and headaches.  Hematological: Negative.   Psychiatric/Behavioral: Negative.  Negative for behavioral problems, self-injury and suicidal ideas. The patient is not nervous/anxious.     Vital Signs: BP 135/72 Comment: 150/72  Pulse 95   Temp 98.5 F (36.9 C)   Resp 16   Ht 6\' 3"  (1.905 m)   Wt 222 lb 3.2 oz (100.8 kg)   SpO2 93%   BMI 27.77 kg/m    Physical Exam Vitals reviewed.  Constitutional:      General: He is not in acute distress.    Appearance: Normal appearance. He is well-developed. He is obese. He is not ill-appearing or diaphoretic.  HENT:     Head: Normocephalic and atraumatic.     Right Ear: Tympanic membrane, ear canal and external ear normal.     Left Ear: Tympanic membrane, ear canal and external ear normal.  Nose: Nose normal. No congestion or rhinorrhea.     Mouth/Throat:     Mouth: Mucous membranes are moist.     Pharynx:  Oropharynx is clear. No oropharyngeal exudate or posterior oropharyngeal erythema.  Eyes:     General: No scleral icterus.       Right eye: No discharge.        Left eye: No discharge.     Extraocular Movements: Extraocular movements intact.     Conjunctiva/sclera: Conjunctivae normal.     Pupils: Pupils are equal, round, and reactive to light.  Neck:     Thyroid: No thyromegaly.     Vascular: No JVD.     Trachea: No tracheal deviation.  Cardiovascular:     Rate and Rhythm: Normal rate and regular rhythm.     Pulses: Normal pulses.          Dorsalis pedis pulses are 2+ on the right side and 2+ on the left side.       Posterior tibial pulses are 2+ on the right side and 2+ on the left side.     Heart sounds: Normal heart sounds. No murmur heard.    No friction rub. No gallop.  Pulmonary:     Effort: Pulmonary effort is normal. No respiratory distress.     Breath sounds: Normal breath sounds. No stridor. No wheezing or rales.  Chest:     Chest wall: No tenderness.  Abdominal:     General: Bowel sounds are normal. There is no distension.     Palpations: Abdomen is soft. There is no mass.     Tenderness: There is no abdominal tenderness. There is no guarding or rebound.  Musculoskeletal:        General: No tenderness or deformity. Normal range of motion.     Cervical back: Normal range of motion and neck supple.     Right foot: Normal range of motion. Bunion present. No deformity.     Left foot: Bunion present.  Feet:     Right foot:     Protective Sensation: 6 sites tested.  6 sites sensed.     Skin integrity: Callus and dry skin present.     Left foot:     Protective Sensation: 6 sites tested.  6 sites sensed.     Skin integrity: Callus and dry skin present.  Lymphadenopathy:     Cervical: No cervical adenopathy.  Skin:    General: Skin is warm and dry.     Capillary Refill: Capillary refill takes less than 2 seconds.     Coloration: Skin is not pale.     Findings: No  erythema or rash.  Neurological:     Mental Status: He is alert and oriented to person, place, and time.     Cranial Nerves: No cranial nerve deficit.     Motor: No abnormal muscle tone.     Coordination: Coordination normal.     Gait: Gait normal.     Deep Tendon Reflexes: Reflexes are normal and symmetric.  Psychiatric:        Mood and Affect: Mood normal.        Behavior: Behavior normal.        Thought Content: Thought content normal.        Judgment: Judgment normal.        Assessment/Plan: 1. Encounter for subsequent annual wellness visit (AWV) in Medicare patient (Primary) Age-appropriate preventive screenings and vaccinations discussed, annual physical exam completed. Routine labs for health  maintenance results discussed with patient today. PHM updated.    2. Type 2 diabetes mellitus with other specified complication, with long-term current use of insulin (HCC) Referred to [facility] for annual diabetic eye exams. Elevated A1c, issues with medication adherence sometimes related to cost of the medication and other times due to patient taking medications differently than prescribed. Currently taking lantus insulin for basal insulin dose, 34 units daily. Discussed revising insulin lispro dosing for meals, 10 units with a meal for glucose of 150-200, 15 units with meals for glucose of 201 or greater.  Discussed diet choices and options that are lower in carbohydrates.  Discussed the risks of administering high doses of short acting insulin.  - Ambulatory referral to Ophthalmology  3. Hypertension associated with diabetes (HCC) Stable, continue metoprolol as prescribed.   4. Paroxysmal atrial fibrillation (HCC) Continue metoprolol as prescribed.   5. Hyperlipidemia associated with type 2 diabetes mellitus (HCC) Continue rosuvastatin as prescribed  6. Neuropathy due to type 2 diabetes mellitus (HCC) Has been on gabapentin in the past, currently taking lyrica which is  helping   7. Erythrocytosis Referred to hematology  - Ambulatory referral to Hematology / Oncology  8. Abnormal CBC Referred to hematology - Ambulatory referral to Hematology / Oncology  9. Erectile dysfunction, unspecified erectile dysfunction type Seeing urology, has tried medication, considering discussing a pump with his specialist.      General Counseling: Michael Gross understanding of the findings of todays visit and agrees with plan of treatment. I have discussed any further diagnostic evaluation that may be needed or ordered today. We also reviewed his medications today. he has been encouraged to call the office with any questions or concerns that should arise related to todays visit.    Orders Placed This Encounter  Procedures   Ambulatory referral to Hematology / Oncology   Ambulatory referral to Ophthalmology    No orders of the defined types were placed in this encounter.   Return in about 2 months (around 06/20/2023) for F/U, Michael Gross PCP, eval new med ozempic.   Total time spent:30 Minutes Time spent includes review of chart, medications, test results, and follow up plan with the patient.   [place] Controlled Substance Database was reviewed by me.  This patient was seen by Sallyanne Kuster, FNP-C in collaboration with Dr. Beverely Risen as a part of collaborative care agreement.  Samanthan Dugo R. Tedd Sias, MSN, FNP-C Internal medicine

## 2023-04-20 ENCOUNTER — Telehealth: Payer: Self-pay | Admitting: Nurse Practitioner

## 2023-04-20 NOTE — Telephone Encounter (Signed)
Awaiting 04/19/23 office notes for Ophthalmology referral-Toni

## 2023-04-21 DIAGNOSIS — E119 Type 2 diabetes mellitus without complications: Secondary | ICD-10-CM | POA: Insufficient documentation

## 2023-04-23 ENCOUNTER — Telehealth: Payer: Self-pay | Admitting: Nurse Practitioner

## 2023-04-23 NOTE — Telephone Encounter (Signed)
Ophthalmology referral sent via [facility] to [facility]. Notified patient. Gave telephone # [phone]. Sheralyn Boatman

## 2023-04-24 ENCOUNTER — Inpatient Hospital Stay: Payer: Medicare Other

## 2023-04-24 ENCOUNTER — Encounter: Payer: Self-pay | Admitting: Oncology

## 2023-04-24 ENCOUNTER — Inpatient Hospital Stay: Payer: Medicare Other | Attending: Oncology | Admitting: Oncology

## 2023-04-24 ENCOUNTER — Ambulatory Visit: Payer: Medicare Other | Admitting: Dermatology

## 2023-04-24 VITALS — BP 125/74 | HR 73 | Temp 96.9°F | Resp 16 | Ht 75.0 in | Wt 226.0 lb

## 2023-04-24 DIAGNOSIS — L918 Other hypertrophic disorders of the skin: Secondary | ICD-10-CM

## 2023-04-24 DIAGNOSIS — L720 Epidermal cyst: Secondary | ICD-10-CM

## 2023-04-24 DIAGNOSIS — E611 Iron deficiency: Secondary | ICD-10-CM | POA: Diagnosis not present

## 2023-04-24 DIAGNOSIS — Z1283 Encounter for screening for malignant neoplasm of skin: Secondary | ICD-10-CM

## 2023-04-24 DIAGNOSIS — L578 Other skin changes due to chronic exposure to nonionizing radiation: Secondary | ICD-10-CM

## 2023-04-24 DIAGNOSIS — D229 Melanocytic nevi, unspecified: Secondary | ICD-10-CM

## 2023-04-24 DIAGNOSIS — W908XXA Exposure to other nonionizing radiation, initial encounter: Secondary | ICD-10-CM

## 2023-04-24 DIAGNOSIS — L729 Follicular cyst of the skin and subcutaneous tissue, unspecified: Secondary | ICD-10-CM

## 2023-04-24 DIAGNOSIS — L814 Other melanin hyperpigmentation: Secondary | ICD-10-CM | POA: Diagnosis not present

## 2023-04-24 DIAGNOSIS — L570 Actinic keratosis: Secondary | ICD-10-CM

## 2023-04-24 DIAGNOSIS — D751 Secondary polycythemia: Secondary | ICD-10-CM

## 2023-04-24 DIAGNOSIS — L821 Other seborrheic keratosis: Secondary | ICD-10-CM

## 2023-04-24 DIAGNOSIS — L820 Inflamed seborrheic keratosis: Secondary | ICD-10-CM | POA: Diagnosis not present

## 2023-04-24 DIAGNOSIS — D1801 Hemangioma of skin and subcutaneous tissue: Secondary | ICD-10-CM

## 2023-04-24 LAB — CBC (CANCER CENTER ONLY)
HCT: 42.4 % (ref 39.0–52.0)
Hemoglobin: 13.1 g/dL (ref 13.0–17.0)
MCH: 23.4 pg — ABNORMAL LOW (ref 26.0–34.0)
MCHC: 30.9 g/dL (ref 30.0–36.0)
MCV: 75.7 fL — ABNORMAL LOW (ref 80.0–100.0)
Platelet Count: 189 10*3/uL (ref 150–400)
RBC: 5.6 MIL/uL (ref 4.22–5.81)
RDW: 16.9 % — ABNORMAL HIGH (ref 11.5–15.5)
WBC Count: 5.7 10*3/uL (ref 4.0–10.5)
nRBC: 0 % (ref 0.0–0.2)

## 2023-04-24 LAB — FERRITIN: Ferritin: 5 ng/mL — ABNORMAL LOW (ref 24–336)

## 2023-04-24 LAB — IRON AND TIBC
Iron: 31 ug/dL — ABNORMAL LOW (ref 45–182)
Saturation Ratios: 6 % — ABNORMAL LOW (ref 17.9–39.5)
TIBC: 524 ug/dL — ABNORMAL HIGH (ref 250–450)
UIBC: 493 ug/dL

## 2023-04-24 NOTE — Progress Notes (Signed)
Follow-Up Visit   Subjective  Michael Gross is a 67 y.o. male who presents for the following: Skin Cancer Screening and Upper Body Skin Exam  The patient presents for Upper Body Skin Exam (UBSE) for skin cancer screening and mole check. The patient has spots, moles and lesions to be evaluated, some may be new or changing and the patient may have concern these could be cancer.  The following portions of the chart were reviewed this encounter and updated as appropriate: medications, allergies, medical history  Review of Systems:  No other skin or systemic complaints except as noted in HPI or Assessment and Plan.  Objective  Well appearing patient in no apparent distress; mood and affect are within normal limits.  All skin waist up examined. Relevant physical exam findings are noted in the Assessment and Plan.  Face x 3 (3) Erythematous thin papules/macules with gritty scale.  Trunk and face x 23 (23) Erythematous stuck-on, waxy papule or plaque  Assessment & Plan   ACTINIC KERATOSIS (3) Face x 3 (3) Actinic keratoses are precancerous spots that appear secondary to cumulative UV radiation exposure/sun exposure over time. They are chronic with expected duration over 1 year. A portion of actinic keratoses will progress to squamous cell carcinoma of the skin. It is not possible to reliably predict which spots will progress to skin cancer and so treatment is recommended to prevent development of skin cancer.  Recommend daily broad spectrum sunscreen SPF 30+ to sun-exposed areas, reapply every 2 hours as needed.  Recommend staying in the shade or wearing long sleeves, sun glasses (UVA+UVB protection) and wide brim hats (4-inch brim around the entire circumference of the hat). Call for new or changing lesions. Destruction of lesion - Face x 3 (3) Complexity: simple   Destruction method: cryotherapy   Informed consent: discussed and consent obtained   Timeout:  patient name, date of  birth, surgical site, and procedure verified Lesion destroyed using liquid nitrogen: Yes   Region frozen until ice ball extended beyond lesion: Yes   Outcome: patient tolerated procedure well with no complications   Post-procedure details: wound care instructions given   INFLAMED SEBORRHEIC KERATOSIS (23) Trunk and face x 23 (23) Symptomatic, irritating, patient would like treated. Destruction of lesion - Trunk and face x 23 (23) Complexity: simple   Destruction method: cryotherapy   Informed consent: discussed and consent obtained   Timeout:  patient name, date of birth, surgical site, and procedure verified Lesion destroyed using liquid nitrogen: Yes   Region frozen until ice ball extended beyond lesion: Yes   Outcome: patient tolerated procedure well with no complications   Post-procedure details: wound care instructions given   Skin cancer screening performed today.  Actinic Damage - Chronic condition, secondary to cumulative UV/sun exposure - diffuse scaly erythematous macules with underlying dyspigmentation - Recommend daily broad spectrum sunscreen SPF 30+ to sun-exposed areas, reapply every 2 hours as needed.  - Staying in the shade or wearing long sleeves, sun glasses (UVA+UVB protection) and wide brim hats (4-inch brim around the entire circumference of the hat) are also recommended for sun protection.  - Call for new or changing lesions.  Lentigines, Seborrheic Keratoses, Hemangiomas - Benign normal skin lesions - Benign-appearing - Call for any changes  Melanocytic Nevi - Tan-brown and/or pink-flesh-colored symmetric macules and papules - Benign appearing on exam today - Observation - Call clinic for new or changing moles - Recommend daily use of broad spectrum spf 30+ sunscreen to sun-exposed  areas.   EPIDERMAL INCLUSION CYST Exam: Subcutaneous nodule at the back x 2  Benign-appearing. Exam most consistent with an epidermal inclusion cyst. Discussed that a cyst is  a benign growth that can grow over time and sometimes get irritated or inflamed. Recommend observation if it is not bothersome. Discussed option of surgical excision to remove it if it is growing, symptomatic, or other changes noted. Please call for new or changing lesions so they can be evaluated.  Acrochordons (Skin Tags) - Fleshy, skin-colored pedunculated papules - Benign appearing.  - Observe. - If desired, they can be removed with an in office procedure that is not covered by insurance. - Please call the clinic if you notice any new or changing lesions.  Return in about 1 year (around 04/23/2024) for TBSE.  Maylene Roes, CMA, am acting as scribe for Armida Sans, MD .  Documentation: I have reviewed the above documentation for accuracy and completeness, and I agree with the above.  Armida Sans, MD

## 2023-04-24 NOTE — Progress Notes (Unsigned)
[facility]  Telephone:(336) [phone] Fax:(336) [phone]  ID: Michael Gross OB: October 14, 1955  MR#: [id number]  UXL#:[id number]  Patient Care Team: Sallyanne Kuster, NP as PCP - General (Nurse Practitioner) Antonieta Iba, MD as PCP - Cardiology (Cardiology) Jeralyn Ruths, MD as Consulting Physician (Oncology)  CHIEF COMPLAINT: Polycythemia.  INTERVAL HISTORY: Patient is a 67 year old male who was noted to have an increased red blood cell count on routine blood work.  He is sent for further evaluation.  He currently feels well and is asymptomatic.  He has no neurologic complaints.  He denies any recent fevers or illnesses.  He has a good appetite and denies weight loss.  He has no chest pain, shortness of breath, cough, or hemoptysis.  He denies any nausea, vomiting, constipation, or diarrhea.  He has no urinary complaints.  Patient feels at his baseline and offers no specific complaints today.  REVIEW OF SYSTEMS:   Review of Systems  Constitutional: Negative.  Negative for fever, malaise/fatigue and weight loss.  Respiratory: Negative.  Negative for cough and shortness of breath.   Cardiovascular: Negative.  Negative for chest pain and leg swelling.  Gastrointestinal: Negative.  Negative for abdominal pain.  Genitourinary: Negative.  Negative for dysuria.  Musculoskeletal: Negative.  Negative for back pain.  Skin: Negative.  Negative for rash.  Neurological: Negative.  Negative for dizziness, focal weakness, weakness and headaches.  Psychiatric/Behavioral: Negative.  The patient is not nervous/anxious.     As per HPI. Otherwise, a complete review of systems is negative.  PAST MEDICAL HISTORY: Past Medical History:  Diagnosis Date   Aortic atherosclerosis (HCC) 07/2022   noted on CXR   Arthritis    ankles   Basal cell carcinoma    BPH (benign prostatic hyperplasia)    Complication of anesthesia    slow to wake   Diabetes (HCC)    a. diagnosed in  his 40's.   Diastolic dysfunction    a. 11/2019 Echo: EF 60-65%, no rwma, nl RV fxn, no significant valvular abnormality; b. 07/2022 Echo: EF 60-65%, no rwma, GrI DD, nl RV fxn, RVSP 29.68mmHg, AoV sclerosis w/o stenosis.   Dysplastic nevus 12/11/2022   right lateral pectoral, moderate atypia   Hyperlipidemia    PAF (paroxysmal atrial fibrillation) (HCC)    a. 07/2022 noted in the setting of DKA - brief.  CHA2DS2VASc = 3-->OAC deferred for now; b. 10/2022 Zio: Predominantly sinus rhythm at 69 (49-171).  1 6 beat run of nonsustained VT.  8 brief runs of PSVT (fastest 8 beats).  Rare PAC and PVC.  Triggered event associated with sinus rhythm.  No atrial fibrillation.   Vertigo    none for over 15 years    PAST SURGICAL HISTORY: Past Surgical History:  Procedure Laterality Date   COLONOSCOPY WITH PROPOFOL N/A 07/27/2022   Procedure: COLONOSCOPY WITH PROPOFOL;  Surgeon: Toney Reil, MD;  Location: [facility];  Service: Endoscopy;  Laterality: N/A;  Diabetic   HERNIA REPAIR     2003   KNEE SURGERY     1998   MOHS SURGERY     2003   SHOULDER ARTHROSCOPY WITH OPEN ROTATOR CUFF REPAIR AND DISTAL CLAVICLE ACROMINECTOMY Left 08/11/2021   Procedure: SHOULDER ARTHROSCOPY WITH OPEN ROTATOR CUFF REPAIR AND DISTAL CLAVICLE ACROMINECTOMY;  Surgeon: Juanell Fairly, MD;  Location: [facility];  Service: Orthopedics;  Laterality: Left;    FAMILY HISTORY: Family History  Problem Relation Age of Onset   Heart disease Father  Prostate cancer Neg Hx    Kidney cancer Neg Hx    Bladder Cancer Neg Hx     ADVANCED DIRECTIVES (Y/N):  N  HEALTH MAINTENANCE: Social History   Tobacco Use   Smoking status: Never   Smokeless tobacco: Never  Vaping Use   Vaping status: Never Used  Substance Use Topics   Alcohol use: Yes    Comment: rare   Drug use: No     Colonoscopy:  PAP:  Bone density:  Lipid panel:  No Known Allergies  Current Outpatient Medications  Medication Sig Dispense  Refill   Ascorbic Acid (VITAMIN C PO) Take by mouth.     aspirin EC 81 MG tablet Take 1 tablet (81 mg total) by mouth daily. Swallow whole. 30 tablet 0   Cholecalciferol (VITAMIN D3 PO) Take by mouth.     Cyanocobalamin (VITAMIN B-12 PO) Take by mouth.     ezetimibe (ZETIA) 10 MG tablet Take 1 tablet (10 mg total) by mouth daily. 90 tablet 1   insulin glargine (LANTUS SOLOSTAR) 100 UNIT/ML Solostar Pen Inject 34 Units into the skin at bedtime. 3 mL 3   insulin lispro (HUMALOG) 100 UNIT/ML KwikPen Inject insulin dose into the skin before meals per sliding scale. Max dose per 24 hours: 42 units. 15 mL 2   Insulin Pen Needle (BD PEN NEEDLE NANO U/F) 32G X 4 MM MISC Use as directed with SOLIQUA to inject insulin every morning E11.65 300 each 1   JARDIANCE 25 MG TABS tablet TAKE 1 TABLET (25 MG TOTAL) BY MOUTH DAILY. 30 tablet 5   Lancets 30G MISC Use to check blood sugars 5 times per day     meloxicam (MOBIC) 15 MG tablet Take 1 tablet (15 mg total) by mouth daily. 90 tablet 1   metoprolol succinate (TOPROL-XL) 25 MG 24 hr tablet TAKE 1 TABLET (25 MG TOTAL) BY MOUTH DAILY. 90 tablet 3   Multiple Vitamin (MULTIVITAMIN WITH MINERALS) TABS tablet Take 1 tablet by mouth daily.     pantoprazole (PROTONIX) 40 MG tablet Take 1 tablet (40 mg total) by mouth 2 (two) times daily. 180 tablet 1   pregabalin (LYRICA) 25 MG capsule Take 1 capsule (25 mg total) by mouth 2 (two) times daily. 60 capsule 2   rosuvastatin (CRESTOR) 40 MG tablet Take 1 tablet (40 mg total) by mouth daily. 90 tablet 3   tamsulosin (FLOMAX) 0.4 MG CAPS capsule TAKE 2 CAPSULES BY MOUTH EVERY DAY 180 capsule 1   No current facility-administered medications for this visit.    OBJECTIVE: Vitals:   04/24/23 1329  BP: 125/74  Pulse: 73  Resp: 16  Temp: (!) 96.9 F (36.1 C)  SpO2: 95%     Body mass index is 28.25 kg/m.    ECOG FS:0 - Asymptomatic  General: Well-developed, well-nourished, no acute distress. Eyes: Pink conjunctiva,  anicteric sclera. HEENT: Normocephalic, moist mucous membranes. Lungs: No audible wheezing or coughing. Heart: Regular rate and rhythm. Abdomen: Soft, nontender, no obvious distention. Musculoskeletal: No edema, cyanosis, or clubbing. Neuro: Alert, answering all questions appropriately. Cranial nerves grossly intact. Skin: No rashes or petechiae noted. Psych: Normal affect. Lymphatics: No cervical, calvicular, axillary or inguinal LAD.   LAB RESULTS:  Lab Results  Component Value Date   NA 142 04/11/2023   K 5.0 04/11/2023   CL 105 04/11/2023   CO2 24 04/11/2023   GLUCOSE 146 (H) 04/11/2023   BUN 15 04/11/2023   CREATININE 0.82 04/11/2023   CALCIUM  9.8 04/11/2023   PROT 6.6 04/11/2023   ALBUMIN 4.1 04/11/2023   AST 26 04/11/2023   ALT 24 04/11/2023   ALKPHOS 70 04/11/2023   BILITOT 0.4 04/11/2023   GFRNONAA >60 08/16/2022   GFRAA 105 04/13/2020    Lab Results  Component Value Date   WBC 6.5 04/11/2023   NEUTROABS 3.1 04/11/2023   HGB 13.7 04/11/2023   HCT 46.4 04/11/2023   MCV 79 04/11/2023   PLT 213 04/11/2023   Lab Results  Component Value Date   IRON 31 (L) 04/24/2023   TIBC 524 (H) 04/24/2023   IRONPCTSAT 6 (L) 04/24/2023   Lab Results  Component Value Date   FERRITIN 5 (L) 04/24/2023     STUDIES: No results found.  ASSESSMENT: Polycythemia.  PLAN:    Polycythemia: Patient previously noted to have an elevated RBC count, but his hemoglobin continues to be within normal limits.  His MCV is decreased secondary to iron deficiency.  Erythropoietin levels are only mildly elevated.  Carbon monoxide levels as well as JAK2 mutation with reflex are pending at time of dictation.  No intervention is needed at this time.  Patient will return to clinic in 3 to 4 weeks with video-assisted telemedicine visit to discuss his results. Iron deficiency without anemia: Patient underwent colonoscopy in March 2024, no pathology was noted but reported an adequate prep and  recommendation was to repeat in 1 year.  Will recommend oral iron supplementation at this time.  I spent a total of 45 minutes reviewing chart data, face-to-face evaluation with the patient, counseling and coordination of care as detailed above.   Patient expressed understanding and was in agreement with this plan. He also understands that He can call clinic at any time with any questions, concerns, or complaints.    Jeralyn Ruths, MD   04/24/2023 1:56 PM

## 2023-04-24 NOTE — Patient Instructions (Signed)

## 2023-04-25 LAB — ERYTHROPOIETIN: Erythropoietin: 33.2 m[IU]/mL — ABNORMAL HIGH (ref 2.6–18.5)

## 2023-04-25 LAB — CARBON MONOXIDE, BLOOD (PERFORMED AT REF LAB): Carbon Monoxide, Blood: 1.8 % (ref 0.0–3.6)

## 2023-04-30 ENCOUNTER — Encounter: Payer: Self-pay | Admitting: Dermatology

## 2023-04-30 LAB — CALR +MPL + E12-E15  (REFLEX)

## 2023-04-30 LAB — JAK2 V617F RFX CALR/MPL/E12-15

## 2023-05-16 ENCOUNTER — Other Ambulatory Visit: Payer: Self-pay | Admitting: Nurse Practitioner

## 2023-05-17 ENCOUNTER — Inpatient Hospital Stay: Payer: Medicare Other | Attending: Oncology | Admitting: Oncology

## 2023-05-17 DIAGNOSIS — D751 Secondary polycythemia: Secondary | ICD-10-CM

## 2023-05-17 DIAGNOSIS — E611 Iron deficiency: Secondary | ICD-10-CM | POA: Diagnosis not present

## 2023-05-17 NOTE — Progress Notes (Signed)
  [facility]  Telephone:(336) [phone] Fax:(336) [phone]  ID: Michael Gross OB: Oct 09, 1955  MR#: [id number]  RDW#:[id number]  Patient Care Team: Liana Fish, NP as PCP - General (Nurse Practitioner) Perla Evalene PARAS, MD as PCP - Cardiology (Cardiology) Jacobo Evalene PARAS, MD as Consulting Physician (Oncology)  I connected with Michael Gross on 05/18/23 at  2:45 PM EST by video enabled telemedicine visit and verified that I am speaking with the correct person using two identifiers.   I discussed the limitations, risks, security and privacy concerns of performing an evaluation and management service by telemedicine and the availability of in-person appointments. I also discussed with the patient that there may be a patient responsible charge related to this service. The patient expressed understanding and agreed to proceed.   Other persons participating in the visit and their role in the encounter: Patient, MD.  Patient's location: Home. Provider's location: Clinic.  CHIEF COMPLAINT: Polycythemia.  INTERVAL HISTORY: Patient agreed to video-assisted send visit for further evaluation and discussion of his laboratory results.  He continues to feel well and remains asymptomatic. He has no neurologic complaints.  He denies any recent fevers or illnesses.  He has a good appetite and denies weight loss.  He has no chest pain, shortness of breath, cough, or hemoptysis.  He denies any nausea, vomiting, constipation, or diarrhea.  He has no urinary complaints.  Patient offers no specific complaints today.  REVIEW OF SYSTEMS:   Review of Systems  Constitutional: Negative.  Negative for fever, malaise/fatigue and weight loss.  Respiratory: Negative.  Negative for cough and shortness of breath.   Cardiovascular: Negative.  Negative for chest pain and leg swelling.  Gastrointestinal: Negative.  Negative for abdominal pain.  Genitourinary: Negative.  Negative for  dysuria.  Musculoskeletal: Negative.  Negative for back pain.  Skin: Negative.  Negative for rash.  Neurological: Negative.  Negative for dizziness, focal weakness, weakness and headaches.  Psychiatric/Behavioral: Negative.  The patient is not nervous/anxious.     As per HPI. Otherwise, a complete review of systems is negative.  PAST MEDICAL HISTORY: Past Medical History:  Diagnosis Date   Aortic atherosclerosis (HCC) 07/2022   noted on CXR   Arthritis    ankles   Basal cell carcinoma    BPH (benign prostatic hyperplasia)    Complication of anesthesia    slow to wake   Diabetes (HCC)    a. diagnosed in his 40's.   Diastolic dysfunction    a. 11/2019 Echo: EF 60-65%, no rwma, nl RV fxn, no significant valvular abnormality; b. 07/2022 Echo: EF 60-65%, no rwma, GrI DD, nl RV fxn, RVSP 29.38mmHg, AoV sclerosis w/o stenosis.   Dysplastic nevus 12/11/2022   right lateral pectoral, moderate atypia   Hyperlipidemia    PAF (paroxysmal atrial fibrillation) (HCC)    a. 07/2022 noted in the setting of DKA - brief.  CHA2DS2VASc = 3-->OAC deferred for now; b. 10/2022 Zio: Predominantly sinus rhythm at 69 (49-171).  1 6 beat run of nonsustained VT.  8 brief runs of PSVT (fastest 8 beats).  Rare PAC and PVC.  Triggered event associated with sinus rhythm.  No atrial fibrillation.   Vertigo    none for over 15 years    PAST SURGICAL HISTORY: Past Surgical History:  Procedure Laterality Date   COLONOSCOPY WITH PROPOFOL  N/A 07/27/2022   Procedure: COLONOSCOPY WITH PROPOFOL ;  Surgeon: Unk Corinn Skiff, MD;  Location: [facility];  Service: Endoscopy;  Laterality: N/A;  Diabetic  HERNIA REPAIR     2003   KNEE SURGERY     1998   MOHS SURGERY     2003   SHOULDER ARTHROSCOPY WITH OPEN ROTATOR CUFF REPAIR AND DISTAL CLAVICLE ACROMINECTOMY Left 08/11/2021   Procedure: SHOULDER ARTHROSCOPY WITH OPEN ROTATOR CUFF REPAIR AND DISTAL CLAVICLE ACROMINECTOMY;  Surgeon: Marchia Drivers, MD;  Location:  [facility];  Service: Orthopedics;  Laterality: Left;    FAMILY HISTORY: Family History  Problem Relation Age of Onset   Heart disease Father    Prostate cancer Neg Hx    Kidney cancer Neg Hx    Bladder Cancer Neg Hx     ADVANCED DIRECTIVES (Y/N):  N  HEALTH MAINTENANCE: Social History   Tobacco Use   Smoking status: Never   Smokeless tobacco: Never  Vaping Use   Vaping status: Never Used  Substance Use Topics   Alcohol use: Yes    Comment: rare   Drug use: No     Colonoscopy:  PAP:  Bone density:  Lipid panel:  No Known Allergies  Current Outpatient Medications  Medication Sig Dispense Refill   Ascorbic Acid (VITAMIN C PO) Take by mouth.     aspirin  EC 81 MG tablet Take 1 tablet (81 mg total) by mouth daily. Swallow whole. 30 tablet 0   Cholecalciferol (VITAMIN D3 PO) Take by mouth.     Cyanocobalamin  (VITAMIN B-12 PO) Take by mouth.     ezetimibe  (ZETIA ) 10 MG tablet Take 1 tablet (10 mg total) by mouth daily. 90 tablet 1   insulin  glargine (LANTUS  SOLOSTAR) 100 UNIT/ML Solostar Pen Inject 34 Units into the skin at bedtime. 3 mL 3   insulin  lispro (HUMALOG ) 100 UNIT/ML KwikPen Inject insulin  dose into the skin before meals per sliding scale. Max dose per 24 hours: 42 units. 15 mL 2   Insulin  Pen Needle (BD PEN NEEDLE NANO U/F) 32G X 4 MM MISC Use as directed with SOLIQUA  to inject insulin  every morning E11.65 300 each 1   JARDIANCE  25 MG TABS tablet TAKE 1 TABLET (25 MG TOTAL) BY MOUTH DAILY. 30 tablet 5   Lancets 30G MISC Use to check blood sugars 5 times per day     meloxicam  (MOBIC ) 15 MG tablet Take 1 tablet (15 mg total) by mouth daily. 90 tablet 1   metoprolol  succinate (TOPROL -XL) 25 MG 24 hr tablet TAKE 1 TABLET (25 MG TOTAL) BY MOUTH DAILY. 90 tablet 3   Multiple Vitamin (MULTIVITAMIN WITH MINERALS) TABS tablet Take 1 tablet by mouth daily.     pantoprazole  (PROTONIX ) 40 MG tablet Take 1 tablet (40 mg total) by mouth 2 (two) times daily. 180 tablet 1    pregabalin  (LYRICA ) 25 MG capsule Take 1 capsule (25 mg total) by mouth 2 (two) times daily. 60 capsule 2   rosuvastatin  (CRESTOR ) 40 MG tablet Take 1 tablet (40 mg total) by mouth daily. 90 tablet 3   tamsulosin  (FLOMAX ) 0.4 MG CAPS capsule TAKE 2 CAPSULES BY MOUTH EVERY DAY 180 capsule 1   No current facility-administered medications for this visit.    OBJECTIVE: There were no vitals filed for this visit.    There is no height or weight on file to calculate BMI.    ECOG FS:0 - Asymptomatic  General: Well-developed, well-nourished, no acute distress. HEENT: Normocephalic. Neuro: Alert, answering all questions appropriately. Cranial nerves grossly intact. Psych: Normal affect.  LAB RESULTS:  Lab Results  Component Value Date   NA 142 04/11/2023  K 5.0 04/11/2023   CL 105 04/11/2023   CO2 24 04/11/2023   GLUCOSE 146 (H) 04/11/2023   BUN 15 04/11/2023   CREATININE 0.82 04/11/2023   CALCIUM  9.8 04/11/2023   PROT 6.6 04/11/2023   ALBUMIN 4.1 04/11/2023   AST 26 04/11/2023   ALT 24 04/11/2023   ALKPHOS 70 04/11/2023   BILITOT 0.4 04/11/2023   GFRNONAA >60 08/16/2022   GFRAA 105 04/13/2020    Lab Results  Component Value Date   WBC 5.7 04/24/2023   NEUTROABS 3.1 04/11/2023   HGB 13.1 04/24/2023   HCT 42.4 04/24/2023   MCV 75.7 (L) 04/24/2023   PLT 189 04/24/2023   Lab Results  Component Value Date   IRON 31 (L) 04/24/2023   TIBC 524 (H) 04/24/2023   IRONPCTSAT 6 (L) 04/24/2023   Lab Results  Component Value Date   FERRITIN 5 (L) 04/24/2023     STUDIES: No results found.  ASSESSMENT: Polycythemia.  PLAN:    Polycythemia: Patient previously noted to have an elevated RBC count, but his hemoglobin continues to be within normal limits.  His MCV is decreased secondary to iron deficiency.  Erythropoietin  levels are only mildly elevated.  Carbon monoxide levels as well as JAK2 mutation are also both negative.  No intervention is needed at this time.  No further  follow-up has been scheduled.   Iron deficiency without anemia: Patient underwent colonoscopy in March 2024, no pathology was noted but reported an adequate prep and recommendation was to repeat in 1 year.  Recommended patient take oral iron supplementation and reschedule colonoscopy in the next several months.     I provided 20 minutes of face-to-face video visit time during this encounter which included chart review, counseling, and coordination of care as documented above.    Patient expressed understanding and was in agreement with this plan. He also understands that He can call clinic at any time with any questions, concerns, or complaints.    Evalene JINNY Reusing, MD   05/17/2023 2:56 PM

## 2023-05-24 ENCOUNTER — Telehealth: Payer: Self-pay | Admitting: Nurse Practitioner

## 2023-05-24 NOTE — Telephone Encounter (Signed)
Per [place] w/ [facility], referral has been closed due to no return call from patient -Sheralyn Boatman

## 2023-06-18 ENCOUNTER — Ambulatory Visit: Payer: Medicare Other | Admitting: Urology

## 2023-06-20 ENCOUNTER — Ambulatory Visit: Payer: Medicare Other | Admitting: Urology

## 2023-06-20 ENCOUNTER — Encounter: Payer: Self-pay | Admitting: Urology

## 2023-06-20 VITALS — BP 132/71 | HR 78 | Ht 72.0 in | Wt 215.0 lb

## 2023-06-20 DIAGNOSIS — N401 Enlarged prostate with lower urinary tract symptoms: Secondary | ICD-10-CM

## 2023-06-20 DIAGNOSIS — R339 Retention of urine, unspecified: Secondary | ICD-10-CM | POA: Diagnosis not present

## 2023-06-20 LAB — BLADDER SCAN AMB NON-IMAGING: Scan Result: 542

## 2023-06-20 NOTE — Progress Notes (Signed)
I, Maysun Anabel Bene, acting as a scribe for [name], MD., have documented all relevant documentation on the behalf of [name], MD, as directed by [name], MD while in the presence of [name], MD.  06/20/2023 11:04 AM   Michael Gross May 07, 1956 [id number]  Referring provider: Sallyanne Kuster, NP [address] [place],  [place] [address]  Chief Complaint  Patient presents with   Benign Prostatic Hypertrophy   Urologic history: 1. BPH with incomplete bladder emptying Tamsulosin Initially seen 2024 with severe low urinary tract symptoms and residual 450-600 cc's. Presently on tamsulosin 0.8 mg daily and his last PVR April 2024 was 313 mL.  Cystoscopy performed 09/15/22 showed moderate lateral lobe enlargement and TRUS prostate showed a 47 cc volume.  HPI: Michael Gross is a 68 y.o. male who presents for a 6 month follow-up.  Stable lower lower urinary tract symptoms since last visit.  Remains on Tamsulosin 0.8 mg daily.  No dysuria, gross hematuria.  Denies flank, abdominal, or pelvic pain.    PMH: Past Medical History:  Diagnosis Date   Aortic atherosclerosis (HCC) 07/2022   noted on CXR   Arthritis    ankles   Basal cell carcinoma    BPH (benign prostatic hyperplasia)    Complication of anesthesia    slow to wake   Diabetes (HCC)    a. diagnosed in his 40's.   Diastolic dysfunction    a. 11/2019 Echo: EF 60-65%, no rwma, nl RV fxn, no significant valvular abnormality; b. 07/2022 Echo: EF 60-65%, no rwma, GrI DD, nl RV fxn, RVSP 29.59mmHg, AoV sclerosis w/o stenosis.   Dysplastic nevus 12/11/2022   right lateral pectoral, moderate atypia   Hyperlipidemia    PAF (paroxysmal atrial fibrillation) (HCC)    a. 07/2022 noted in the setting of DKA - brief.  CHA2DS2VASc = 3-->OAC deferred for now; b. 10/2022 Zio: Predominantly sinus rhythm at 69 (49-171).  1 6 beat run of nonsustained VT.  8 brief runs of PSVT (fastest 8 beats).  Rare PAC  and PVC.  Triggered event associated with sinus rhythm.  No atrial fibrillation.   Vertigo    none for over 15 years    Surgical History: Past Surgical History:  Procedure Laterality Date   COLONOSCOPY WITH PROPOFOL N/A 07/27/2022   Procedure: COLONOSCOPY WITH PROPOFOL;  Surgeon: Toney Reil, MD;  Location: [facility];  Service: Endoscopy;  Laterality: N/A;  Diabetic   HERNIA REPAIR     2003   KNEE SURGERY     1998   MOHS SURGERY     2003   SHOULDER ARTHROSCOPY WITH OPEN ROTATOR CUFF REPAIR AND DISTAL CLAVICLE ACROMINECTOMY Left 08/11/2021   Procedure: SHOULDER ARTHROSCOPY WITH OPEN ROTATOR CUFF REPAIR AND DISTAL CLAVICLE ACROMINECTOMY;  Surgeon: Juanell Fairly, MD;  Location: [facility];  Service: Orthopedics;  Laterality: Left;    Home Medications:  Allergies as of 06/20/2023   No Known Allergies      Medication List        Accurate as of June 20, 2023 11:04 AM. If you have any questions, ask your nurse or doctor.          aspirin EC 81 MG tablet Take 1 tablet (81 mg total) by mouth daily. Swallow whole.   BD Pen Needle Nano U/F 32G X 4 MM Misc Generic drug: Insulin Pen Needle Use as directed with SOLIQUA to inject insulin every morning E11.65   ezetimibe  10 MG tablet Commonly known as: Zetia Take 1 tablet (10 mg total) by mouth daily.   insulin lispro 100 UNIT/ML KwikPen Commonly known as: HUMALOG Inject insulin dose into the skin before meals per sliding scale. Max dose per 24 hours: 42 units.   Jardiance 25 MG Tabs tablet Generic drug: empagliflozin TAKE 1 TABLET (25 MG TOTAL) BY MOUTH DAILY.   Lancets 30G Misc Use to check blood sugars 5 times per day   Lantus SoloStar 100 UNIT/ML Solostar Pen Generic drug: insulin glargine Inject 34 Units into the skin at bedtime. What changed: additional instructions   meloxicam 15 MG tablet Commonly known as: MOBIC Take 1 tablet (15 mg total) by mouth daily.   metoprolol succinate 25 MG 24  hr tablet Commonly known as: TOPROL-XL TAKE 1 TABLET (25 MG TOTAL) BY MOUTH DAILY.   multivitamin with minerals Tabs tablet Take 1 tablet by mouth daily.   Ozempic (0.25 or 0.5 MG/DOSE) 2 MG/3ML Sopn Generic drug: Semaglutide(0.25 or 0.5MG /DOS)   pantoprazole 40 MG tablet Commonly known as: PROTONIX Take 1 tablet (40 mg total) by mouth 2 (two) times daily.   pregabalin 25 MG capsule Commonly known as: LYRICA Take 1 capsule (25 mg total) by mouth 2 (two) times daily.   rosuvastatin 40 MG tablet Commonly known as: CRESTOR Take 1 tablet (40 mg total) by mouth daily.   tamsulosin 0.4 MG Caps capsule Commonly known as: FLOMAX TAKE 2 CAPSULES BY MOUTH EVERY DAY   VITAMIN B-12 PO Take by mouth.   VITAMIN C PO Take by mouth.   VITAMIN D3 PO Take by mouth.        Allergies: No Known Allergies  Family History: Family History  Problem Relation Age of Onset   Heart disease Father    Prostate cancer Neg Hx    Kidney cancer Neg Hx    Bladder Cancer Neg Hx     Social History:  reports that he has never smoked. He has never used smokeless tobacco. He reports current alcohol use. He reports that he does not use drugs.   Physical Exam: BP 132/71   Pulse 78   Ht 6' (1.829 m)   Wt 215 lb (97.5 kg)   BMI 29.16 kg/m   Constitutional:  Alert and oriented, No acute distress. HEENT: [place] AT Cardiovascular: No clubbing, cyanosis, or edema. Respiratory: Normal respiratory effort, no increased work of breathing. Psychiatric: Normal mood and affect.   Assessment & Plan:    1. BPH with incomplete bladder emptying PVR today 542 mL F/U PVR 2 weeks  I have reviewed the above documentation for accuracy and completeness, and I agree with the above.   [name], MD  [facility] [address] [place], [place] [address] [phone]

## 2023-06-21 ENCOUNTER — Ambulatory Visit: Payer: Medicare Other | Admitting: Nurse Practitioner

## 2023-06-21 ENCOUNTER — Encounter: Payer: Self-pay | Admitting: Nurse Practitioner

## 2023-06-21 VITALS — BP 134/78 | HR 78 | Temp 97.9°F | Resp 16 | Ht 72.0 in | Wt 220.6 lb

## 2023-06-21 DIAGNOSIS — Z794 Long term (current) use of insulin: Secondary | ICD-10-CM

## 2023-06-21 DIAGNOSIS — N401 Enlarged prostate with lower urinary tract symptoms: Secondary | ICD-10-CM

## 2023-06-21 DIAGNOSIS — E1159 Type 2 diabetes mellitus with other circulatory complications: Secondary | ICD-10-CM

## 2023-06-21 DIAGNOSIS — E1140 Type 2 diabetes mellitus with diabetic neuropathy, unspecified: Secondary | ICD-10-CM | POA: Diagnosis not present

## 2023-06-21 DIAGNOSIS — E1169 Type 2 diabetes mellitus with other specified complication: Secondary | ICD-10-CM

## 2023-06-21 DIAGNOSIS — I152 Hypertension secondary to endocrine disorders: Secondary | ICD-10-CM

## 2023-06-21 DIAGNOSIS — R351 Nocturia: Secondary | ICD-10-CM

## 2023-06-21 DIAGNOSIS — E785 Hyperlipidemia, unspecified: Secondary | ICD-10-CM

## 2023-06-21 MED ORDER — PREGABALIN 25 MG PO CAPS: 25.0000 mg | ORAL_CAPSULE | Freq: Two times a day (BID) | ORAL | 2 refills | Status: DC

## 2023-06-21 MED ORDER — OZEMPIC (0.25 OR 0.5 MG/DOSE) 2 MG/3ML ~~LOC~~ SOPN: 0.5000 mg | PEN_INJECTOR | SUBCUTANEOUS | 0 refills | Status: DC

## 2023-06-21 MED ORDER — TAMSULOSIN HCL 0.4 MG PO CAPS
0.8000 mg | ORAL_CAPSULE | Freq: Every day | ORAL | 1 refills | Status: DC
Start: 2023-06-21 — End: 2024-02-01

## 2023-06-21 MED ORDER — EMPAGLIFLOZIN 25 MG PO TABS: 25.0000 mg | ORAL_TABLET | Freq: Every day | ORAL | 5 refills | Status: DC

## 2023-06-21 MED ORDER — EZETIMIBE 10 MG PO TABS: 10.0000 mg | ORAL_TABLET | Freq: Every day | ORAL | 1 refills | Status: DC

## 2023-06-21 NOTE — Progress Notes (Signed)
[facility] [address] [place], [place] [address]  Internal MEDICINE  Office Visit Note  Patient Name: Michael Gross  [id number]  [id number]  Date of Service: 06/21/2023  Chief Complaint  Patient presents with   Diabetes   Hyperlipidemia   Follow-up    HPI [name] presents for a follow-up visit for diabetes, neuropathy, high cholesterol, hypertension, and BPH.  Diabetes -- glucose levels have improved with ozempic and with regular exercise. Also taking basal and meal time insulin, and jardiance 25 mg daily Neuropathy -- taking pregabalin once daily, but prescribed as twice daily.  High cholesterol -- takes rosuvastatin and ezetimibe  Hypertension -- BP controlled with metoprolol BPH -- sees urology and currently taking flomax.     Current Medication: Outpatient Encounter Medications as of 06/21/2023  Medication Sig   Ascorbic Acid (VITAMIN C PO) Take by mouth.   aspirin EC 81 MG tablet Take 1 tablet (81 mg total) by mouth daily. Swallow whole.   Cholecalciferol (VITAMIN D3 PO) Take by mouth.   Cyanocobalamin (VITAMIN B-12 PO) Take by mouth.   insulin glargine (LANTUS SOLOSTAR) 100 UNIT/ML Solostar Pen Inject 34 Units into the skin at bedtime. (Patient taking differently: Inject 34 Units into the skin at bedtime. 12-15 units)   insulin lispro (HUMALOG) 100 UNIT/ML KwikPen Inject insulin dose into the skin before meals per sliding scale. Max dose per 24 hours: 42 units.   Insulin Pen Needle (BD PEN NEEDLE NANO U/F) 32G X 4 MM MISC Use as directed with SOLIQUA to inject insulin every morning E11.65   Lancets 30G MISC Use to check blood sugars 5 times per day   meloxicam (MOBIC) 15 MG tablet Take 1 tablet (15 mg total) by mouth daily.   metoprolol succinate (TOPROL-XL) 25 MG 24 hr tablet TAKE 1 TABLET (25 MG TOTAL) BY MOUTH DAILY.   Multiple Vitamin (MULTIVITAMIN WITH MINERALS) TABS tablet Take 1 tablet by mouth daily.   pantoprazole (PROTONIX) 40 MG tablet Take  1 tablet (40 mg total) by mouth 2 (two) times daily.   rosuvastatin (CRESTOR) 40 MG tablet Take 1 tablet (40 mg total) by mouth daily.   [DISCONTINUED] ezetimibe (ZETIA) 10 MG tablet Take 1 tablet (10 mg total) by mouth daily.   [DISCONTINUED] JARDIANCE 25 MG TABS tablet TAKE 1 TABLET (25 MG TOTAL) BY MOUTH DAILY.   [DISCONTINUED] OZEMPIC, 0.25 OR 0.5 MG/DOSE, 2 MG/3ML SOPN    [DISCONTINUED] pregabalin (LYRICA) 25 MG capsule Take 1 capsule (25 mg total) by mouth 2 (two) times daily.   [DISCONTINUED] tamsulosin (FLOMAX) 0.4 MG CAPS capsule TAKE 2 CAPSULES BY MOUTH EVERY DAY   empagliflozin (JARDIANCE) 25 MG TABS tablet Take 1 tablet (25 mg total) by mouth daily.   ezetimibe (ZETIA) 10 MG tablet Take 1 tablet (10 mg total) by mouth daily.   OZEMPIC, 0.25 OR 0.5 MG/DOSE, 2 MG/3ML SOPN Inject 0.5 mg into the skin once a week.   pregabalin (LYRICA) 25 MG capsule Take 1 capsule (25 mg total) by mouth 2 (two) times daily.   tamsulosin (FLOMAX) 0.4 MG CAPS capsule Take 2 capsules (0.8 mg total) by mouth daily.   No facility-administered encounter medications on file as of 06/21/2023.    Surgical History: Past Surgical History:  Procedure Laterality Date   COLONOSCOPY WITH PROPOFOL N/A 07/27/2022   Procedure: COLONOSCOPY WITH PROPOFOL;  Surgeon: Toney Reil, MD;  Location: [facility];  Service: Endoscopy;  Laterality: N/A;  Diabetic   HERNIA REPAIR  2003   KNEE SURGERY     1998   MOHS SURGERY     2003   SHOULDER ARTHROSCOPY WITH OPEN ROTATOR CUFF REPAIR AND DISTAL CLAVICLE ACROMINECTOMY Left 08/11/2021   Procedure: SHOULDER ARTHROSCOPY WITH OPEN ROTATOR CUFF REPAIR AND DISTAL CLAVICLE ACROMINECTOMY;  Surgeon: Juanell Fairly, MD;  Location: [facility];  Service: Orthopedics;  Laterality: Left;    Medical History: Past Medical History:  Diagnosis Date   Aortic atherosclerosis (HCC) 07/2022   noted on CXR   Arthritis    ankles   Basal cell carcinoma    BPH (benign prostatic  hyperplasia)    Complication of anesthesia    slow to wake   Diabetes (HCC)    a. diagnosed in his 40's.   Diastolic dysfunction    a. 11/2019 Echo: EF 60-65%, no rwma, nl RV fxn, no significant valvular abnormality; b. 07/2022 Echo: EF 60-65%, no rwma, GrI DD, nl RV fxn, RVSP 29.33mmHg, AoV sclerosis w/o stenosis.   Dysplastic nevus 12/11/2022   right lateral pectoral, moderate atypia   Hyperlipidemia    PAF (paroxysmal atrial fibrillation) (HCC)    a. 07/2022 noted in the setting of DKA - brief.  CHA2DS2VASc = 3-->OAC deferred for now; b. 10/2022 Zio: Predominantly sinus rhythm at 69 (49-171).  1 6 beat run of nonsustained VT.  8 brief runs of PSVT (fastest 8 beats).  Rare PAC and PVC.  Triggered event associated with sinus rhythm.  No atrial fibrillation.   Vertigo    none for over 15 years    Family History: Family History  Problem Relation Age of Onset   Heart disease Father    Prostate cancer Neg Hx    Kidney cancer Neg Hx    Bladder Cancer Neg Hx     Social History   Socioeconomic History   Marital status: Married    Spouse name: Michael Gross   Number of children: 4   Years of education: Not on file   Highest education level: Not on file  Occupational History   Not on file  Tobacco Use   Smoking status: Never   Smokeless tobacco: Never  Vaping Use   Vaping status: Never Used  Substance and Sexual Activity   Alcohol use: Yes    Comment: rare   Drug use: No   Sexual activity: Yes  Other Topics Concern   Not on file  Social History Narrative   Not on file   Social Drivers of Health   Financial Resource Strain: Not on file  Food Insecurity: No Food Insecurity (04/24/2023)   Hunger Vital Sign    Worried About Running Out of Food in the Last Year: Never true    Ran Out of Food in the Last Year: Never true  Transportation Needs: No Transportation Needs (04/24/2023)   PRAPARE - Administrator, Civil Service (Medical): No    Lack of Transportation  (Non-Medical): No  Physical Activity: Not on file  Stress: Not on file  Social Connections: Not on file  Intimate Partner Violence: Not At Risk (04/24/2023)   Humiliation, Afraid, Rape, and Kick questionnaire    Fear of Current or Ex-Partner: No    Emotionally Abused: No    Physically Abused: No    Sexually Abused: No      Review of Systems  Constitutional:  Positive for fatigue. Negative for chills and unexpected weight change.  HENT: Negative.  Negative for congestion, postnasal drip, rhinorrhea, sneezing and sore throat.   Eyes:  Negative for redness.  Respiratory: Negative.  Negative for cough, chest tightness, shortness of breath and wheezing.   Cardiovascular: Negative.  Negative for chest pain and palpitations.  Gastrointestinal:  Negative for abdominal pain, constipation, diarrhea, nausea and vomiting.  Endocrine: Negative for polyuria.  Genitourinary:  Negative for decreased urine volume, difficulty urinating, dysuria and frequency.  Musculoskeletal:  Positive for arthralgias and gait problem (has right foot drop). Negative for back pain, joint swelling and neck pain.  Skin:  Negative for rash.  Neurological:  Positive for tremors. Negative for numbness.  Hematological:  Negative for adenopathy. Does not bruise/bleed easily.  Psychiatric/Behavioral:  Negative for behavioral problems (Depression), sleep disturbance and suicidal ideas. The patient is not nervous/anxious.     Vital Signs: BP 134/78   Pulse 78   Temp 97.9 F (36.6 C)   Resp 16   Ht 6' (1.829 m)   Wt 220 lb 9.6 oz (100.1 kg)   SpO2 95%   BMI 29.92 kg/m    Physical Exam Vitals reviewed.  Constitutional:      General: He is not in acute distress.    Appearance: Normal appearance. He is normal weight. He is not ill-appearing.  HENT:     Head: Normocephalic and atraumatic.  Eyes:     Pupils: Pupils are equal, round, and reactive to light.  Cardiovascular:     Rate and Rhythm: Normal rate and  regular rhythm.  Pulmonary:     Effort: Pulmonary effort is normal. No respiratory distress.  Neurological:     Mental Status: He is alert and oriented to person, place, and time.  Psychiatric:        Mood and Affect: Mood normal.        Behavior: Behavior normal.        Assessment/Plan: 1. Type 2 diabetes mellitus with other specified complication, with long-term current use of insulin (HCC) Continue ozempic and jardiance as prescribed.  - OZEMPIC, 0.25 OR 0.5 MG/DOSE, 2 MG/3ML SOPN; Inject 0.5 mg into the skin once a week.  Dispense: 3 mL; Refill: 0 - empagliflozin (JARDIANCE) 25 MG TABS tablet; Take 1 tablet (25 mg total) by mouth daily.  Dispense: 30 tablet; Refill: 5  2. Hypertension associated with diabetes (HCC) Continue metoprolol as prescribed .  3. Hyperlipidemia associated with type 2 diabetes mellitus (HCC) (Primary) Continue rosuvastatin and ezetimibe as prescribed.  - ezetimibe (ZETIA) 10 MG tablet; Take 1 tablet (10 mg total) by mouth daily.  Dispense: 90 tablet; Refill: 1  4. Neuropathy due to type 2 diabetes mellitus (HCC) Continue pregabalin as prescribed.  - pregabalin (LYRICA) 25 MG capsule; Take 1 capsule (25 mg total) by mouth 2 (two) times daily.  Dispense: 60 capsule; Refill: 2  5. Benign prostatic hyperplasia with nocturia Copntinue flomax as prescribed  - tamsulosin (FLOMAX) 0.4 MG CAPS capsule; Take 2 capsules (0.8 mg total) by mouth daily.  Dispense: 180 capsule; Refill: 1   General Counseling: rodger giangregorio understanding of the findings of todays visit and agrees with plan of treatment. I have discussed any further diagnostic evaluation that may be needed or ordered today. We also reviewed his medications today. he has been encouraged to call the office with any questions or concerns that should arise related to todays visit.    No orders of the defined types were placed in this encounter.   Meds ordered this encounter  Medications    OZEMPIC, 0.25 OR 0.5 MG/DOSE, 2 MG/3ML SOPN    Sig: Inject 0.5  mg into the skin once a week.    Dispense:  3 mL    Refill:  0    Dx code E11.65   pregabalin (LYRICA) 25 MG capsule    Sig: Take 1 capsule (25 mg total) by mouth 2 (two) times daily.    Dispense:  60 capsule    Refill:  2   empagliflozin (JARDIANCE) 25 MG TABS tablet    Sig: Take 1 tablet (25 mg total) by mouth daily.    Dispense:  30 tablet    Refill:  5   ezetimibe (ZETIA) 10 MG tablet    Sig: Take 1 tablet (10 mg total) by mouth daily.    Dispense:  90 tablet    Refill:  1   tamsulosin (FLOMAX) 0.4 MG CAPS capsule    Sig: Take 2 capsules (0.8 mg total) by mouth daily.    Dispense:  180 capsule    Refill:  1    Return in about 6 weeks (around 08/01/2023) for F/U, Recheck A1C, Demitra Danley PCP.   Total time spent:30 Minutes Time spent includes review of chart, medications, test results, and follow up plan with the patient.   [place] Controlled Substance Database was reviewed by me.  This patient was seen by Sallyanne Kuster, FNP-C in collaboration with Dr. Beverely Risen as a part of collaborative care agreement.   Hildagarde Holleran R. Tedd Sias, MSN, FNP-C Internal medicine

## 2023-06-22 ENCOUNTER — Encounter: Payer: Self-pay | Admitting: Nurse Practitioner

## 2023-07-04 ENCOUNTER — Other Ambulatory Visit: Payer: Self-pay

## 2023-07-04 DIAGNOSIS — Z760 Encounter for issue of repeat prescription: Secondary | ICD-10-CM

## 2023-07-04 DIAGNOSIS — Z79899 Other long term (current) drug therapy: Secondary | ICD-10-CM

## 2023-07-04 MED ORDER — METOPROLOL SUCCINATE ER 25 MG PO TB24: 25.0000 mg | ORAL_TABLET | Freq: Every day | ORAL | 3 refills | Status: AC

## 2023-07-04 MED ORDER — BD PEN NEEDLE NANO U/F 32G X 4 MM MISC: 1 refills | Status: DC

## 2023-07-04 MED ORDER — PANTOPRAZOLE SODIUM 40 MG PO TBEC
40.0000 mg | DELAYED_RELEASE_TABLET | Freq: Two times a day (BID) | ORAL | 1 refills | Status: DC
Start: 2023-07-04 — End: 2024-01-15

## 2023-07-10 ENCOUNTER — Other Ambulatory Visit: Payer: Self-pay | Admitting: Nurse Practitioner

## 2023-07-10 DIAGNOSIS — E1165 Type 2 diabetes mellitus with hyperglycemia: Secondary | ICD-10-CM

## 2023-07-10 NOTE — Telephone Encounter (Signed)
Please review send

## 2023-07-10 NOTE — Telephone Encounter (Signed)
 Please check our med list is different units

## 2023-07-16 ENCOUNTER — Other Ambulatory Visit: Payer: Self-pay | Admitting: Nurse Practitioner

## 2023-07-16 DIAGNOSIS — E1169 Type 2 diabetes mellitus with other specified complication: Secondary | ICD-10-CM

## 2023-07-16 NOTE — Telephone Encounter (Signed)
 Please review and send

## 2023-07-20 ENCOUNTER — Ambulatory Visit: Payer: Medicare Other | Admitting: Urology

## 2023-07-23 ENCOUNTER — Encounter: Payer: Self-pay | Admitting: Urology

## 2023-07-23 ENCOUNTER — Ambulatory Visit: Payer: Medicare Other | Admitting: Urology

## 2023-07-23 VITALS — BP 101/65 | HR 80 | Ht 75.0 in | Wt 214.0 lb

## 2023-07-23 DIAGNOSIS — R3914 Feeling of incomplete bladder emptying: Secondary | ICD-10-CM | POA: Diagnosis not present

## 2023-07-23 DIAGNOSIS — N401 Enlarged prostate with lower urinary tract symptoms: Secondary | ICD-10-CM | POA: Diagnosis not present

## 2023-07-23 LAB — BLADDER SCAN AMB NON-IMAGING: Scan Result: 339

## 2023-07-23 NOTE — Progress Notes (Signed)
 I, Michael Gross, acting as a scribe for Michael Altes, MD., have documented all relevant documentation on the behalf of Michael Altes, MD, as directed by Michael Altes, MD while in the presence of Michael Altes, MD.  07/23/2023 11:43 AM   [name] 11-14-1955 [id number]  Referring provider: Sallyanne Kuster, NP [address] [place],  [place] [address]  Chief Complaint  Patient presents with   Benign Prostatic Hypertrophy   Urologic history: 1. BPH with incomplete bladder emptying Tamsulosin  HPI: Michael Gross is a 68 y.o. male presents for 1 month follow up visit.  At last month's visit, his PVR was 542 cc and 1 month follow-up was recommended.  Since that visit, he sometimes feels he can void better if sitting down. Present symptoms are not bothersome.  PVR today was 339 cc and he voided an additional 75 cc after the scan was done.   PMH: Past Medical History:  Diagnosis Date   Aortic atherosclerosis (HCC) 07/2022   noted on CXR   Arthritis    ankles   Basal cell carcinoma    BPH (benign prostatic hyperplasia)    Complication of anesthesia    slow to wake   Diabetes (HCC)    a. diagnosed in his 40's.   Diastolic dysfunction    a. 11/2019 Echo: EF 60-65%, no rwma, nl RV fxn, no significant valvular abnormality; b. 07/2022 Echo: EF 60-65%, no rwma, GrI DD, nl RV fxn, RVSP 29.76mmHg, AoV sclerosis w/o stenosis.   Dysplastic nevus 12/11/2022   right lateral pectoral, moderate atypia   Hyperlipidemia    PAF (paroxysmal atrial fibrillation) (HCC)    a. 07/2022 noted in the setting of DKA - brief.  CHA2DS2VASc = 3-->OAC deferred for now; b. 10/2022 Zio: Predominantly sinus rhythm at 69 (49-171).  1 6 beat run of nonsustained VT.  8 brief runs of PSVT (fastest 8 beats).  Rare PAC and PVC.  Triggered event associated with sinus rhythm.  No atrial fibrillation.   Vertigo    none for over 15 years    Surgical History: Past Surgical History:   Procedure Laterality Date   COLONOSCOPY WITH PROPOFOL N/A 07/27/2022   Procedure: COLONOSCOPY WITH PROPOFOL;  Surgeon: Toney Reil, MD;  Location: [facility];  Service: Endoscopy;  Laterality: N/A;  Diabetic   HERNIA REPAIR     2003   KNEE SURGERY     1998   MOHS SURGERY     2003   SHOULDER ARTHROSCOPY WITH OPEN ROTATOR CUFF REPAIR AND DISTAL CLAVICLE ACROMINECTOMY Left 08/11/2021   Procedure: SHOULDER ARTHROSCOPY WITH OPEN ROTATOR CUFF REPAIR AND DISTAL CLAVICLE ACROMINECTOMY;  Surgeon: Juanell Fairly, MD;  Location: [facility];  Service: Orthopedics;  Laterality: Left;    Home Medications:  Allergies as of 07/23/2023   No Known Allergies      Medication List        Accurate as of July 23, 2023 11:43 AM. If you have any questions, ask your nurse or doctor.          aspirin EC 81 MG tablet Take 1 tablet (81 mg total) by mouth daily. Swallow whole.   BD Pen Needle Nano U/F 32G X 4 MM Misc Generic drug: Insulin Pen Needle Use as directed with SOLIQUA to inject insulin every morning E11.65   empagliflozin 25 MG Tabs tablet Commonly known as: Jardiance Take 1 tablet (25 mg total) by mouth daily.   ezetimibe 10 MG  tablet Commonly known as: Zetia Take 1 tablet (10 mg total) by mouth daily.   insulin lispro 100 UNIT/ML KwikPen Commonly known as: HUMALOG INJECT INSULIN DOSE INTO THE SKIN BEFORE MEALS PER SLIDING SCALE. MAX DOSE PER 24 HOURS: 42 UNITS.   Lancets 30G Misc Use to check blood sugars 5 times per day   Lantus SoloStar 100 UNIT/ML Solostar Pen Generic drug: insulin glargine Inject 34 Units into the skin at bedtime.   meloxicam 15 MG tablet Commonly known as: MOBIC Take 1 tablet (15 mg total) by mouth daily.   metoprolol succinate 25 MG 24 hr tablet Commonly known as: TOPROL-XL Take 1 tablet (25 mg total) by mouth daily.   multivitamin with minerals Tabs tablet Take 1 tablet by mouth daily.   Ozempic (0.25 or 0.5 MG/DOSE) 2 MG/3ML  Sopn Generic drug: Semaglutide(0.25 or 0.5MG /DOS) INJECT 0.5 MG INTO THE SKIN ONE TIME PER WEEK   pantoprazole 40 MG tablet Commonly known as: PROTONIX Take 1 tablet (40 mg total) by mouth 2 (two) times daily.   pregabalin 25 MG capsule Commonly known as: LYRICA Take 1 capsule (25 mg total) by mouth 2 (two) times daily.   rosuvastatin 40 MG tablet Commonly known as: CRESTOR Take 1 tablet (40 mg total) by mouth daily.   tamsulosin 0.4 MG Caps capsule Commonly known as: FLOMAX Take 2 capsules (0.8 mg total) by mouth daily.   VITAMIN B-12 PO Take by mouth.   VITAMIN C PO Take by mouth.   VITAMIN D3 PO Take by mouth.        Allergies: No Known Allergies  Family History: Family History  Problem Relation Age of Onset   Heart disease Father    Prostate cancer Neg Hx    Kidney cancer Neg Hx    Bladder Cancer Neg Hx     Social History:  reports that he has never smoked. He has never used smokeless tobacco. He reports current alcohol use. He reports that he does not use drugs.   Physical Exam: BP 101/65   Pulse 80   Ht 6\' 3"  (1.905 m)   Wt 214 lb (97.1 kg)   BMI 26.75 kg/m   Constitutional:  Alert and oriented, No acute distress. HEENT: [place] AT Respiratory: Normal respiratory effort, no increased work of breathing. Psychiatric: Normal mood and affect.   Assessment & Plan:    1. BPH with incomplete bladder emptying PVR significantly improved.  Continue tamsulosin We discussed adding a 5-ARI to reduce prostate size, however presently his symptoms are not bothersome.  6 month follow-up with PVR.  I have reviewed the above documentation for accuracy and completeness, and I agree with the above.   Michael Altes, MD  [facility] [address], [address] [place], [place] [address] [phone]

## 2023-08-01 ENCOUNTER — Ambulatory Visit (INDEPENDENT_AMBULATORY_CARE_PROVIDER_SITE_OTHER): Payer: Medicare Other | Admitting: Nurse Practitioner

## 2023-08-01 ENCOUNTER — Encounter: Payer: Self-pay | Admitting: Nurse Practitioner

## 2023-08-01 VITALS — BP 104/72 | HR 82 | Temp 98.1°F | Resp 16 | Ht 75.0 in | Wt 214.8 lb

## 2023-08-01 DIAGNOSIS — E1169 Type 2 diabetes mellitus with other specified complication: Secondary | ICD-10-CM | POA: Diagnosis not present

## 2023-08-01 DIAGNOSIS — E1140 Type 2 diabetes mellitus with diabetic neuropathy, unspecified: Secondary | ICD-10-CM

## 2023-08-01 DIAGNOSIS — E785 Hyperlipidemia, unspecified: Secondary | ICD-10-CM

## 2023-08-01 DIAGNOSIS — I152 Hypertension secondary to endocrine disorders: Secondary | ICD-10-CM

## 2023-08-01 DIAGNOSIS — Z794 Long term (current) use of insulin: Secondary | ICD-10-CM | POA: Diagnosis not present

## 2023-08-01 DIAGNOSIS — E1159 Type 2 diabetes mellitus with other circulatory complications: Secondary | ICD-10-CM

## 2023-08-01 LAB — POCT GLYCOSYLATED HEMOGLOBIN (HGB A1C): Hemoglobin A1C: 8.3 % — AB (ref 4.0–5.6)

## 2023-08-01 MED ORDER — INSULIN LISPRO (1 UNIT DIAL) 100 UNIT/ML (KWIKPEN)
PEN_INJECTOR | SUBCUTANEOUS | 11 refills | Status: DC
Start: 2023-08-01 — End: 2023-12-18

## 2023-08-01 NOTE — Progress Notes (Signed)
 [facility] [address] [place], [place] [address]  Internal MEDICINE  Office Visit Note  Patient Name: Michael Gross  [id number]  [id number]  Date of Service: 08/01/2023  Chief Complaint  Patient presents with   Diabetes   Hyperlipidemia   Follow-up    HPI Michael Gross presents for a follow-up visit for diabetes, hypertension, high cholesterol, neuropathy and weight loss.  Diabetes -- A1c is 8.3, improved significantly. Blood sugars are improving. Taking ozempic and jardiance Hypertension - controlled on metoprolol  High cholesterol -- taking rosuvastatin Neuropathy taking lyrica  Exercise and weight lifting regularly. Weight on home scale was 211.3 lbs     Current Medication: Outpatient Encounter Medications as of 08/01/2023  Medication Sig   Ascorbic Acid (VITAMIN C PO) Take by mouth.   aspirin EC 81 MG tablet Take 1 tablet (81 mg total) by mouth daily. Swallow whole.   Cholecalciferol (VITAMIN D3 PO) Take by mouth.   Cyanocobalamin (VITAMIN B-12 PO) Take by mouth.   empagliflozin (JARDIANCE) 25 MG TABS tablet Take 1 tablet (25 mg total) by mouth daily.   ezetimibe (ZETIA) 10 MG tablet Take 1 tablet (10 mg total) by mouth daily.   insulin glargine (LANTUS SOLOSTAR) 100 UNIT/ML Solostar Pen Inject 34 Units into the skin at bedtime.   Insulin Pen Needle (BD PEN NEEDLE NANO U/F) 32G X 4 MM MISC Use as directed with SOLIQUA to inject insulin every morning E11.65   Lancets 30G MISC Use to check blood sugars 5 times per day   meloxicam (MOBIC) 15 MG tablet Take 1 tablet (15 mg total) by mouth daily.   metoprolol succinate (TOPROL-XL) 25 MG 24 hr tablet Take 1 tablet (25 mg total) by mouth daily.   Multiple Vitamin (MULTIVITAMIN WITH MINERALS) TABS tablet Take 1 tablet by mouth daily.   OZEMPIC, 0.25 OR 0.5 MG/DOSE, 2 MG/3ML SOPN INJECT 0.5 MG INTO THE SKIN ONE TIME PER WEEK   pantoprazole (PROTONIX) 40 MG tablet Take 1 tablet (40 mg total) by mouth 2 (two) times  daily.   pregabalin (LYRICA) 25 MG capsule Take 1 capsule (25 mg total) by mouth 2 (two) times daily.   rosuvastatin (CRESTOR) 40 MG tablet Take 1 tablet (40 mg total) by mouth daily.   tamsulosin (FLOMAX) 0.4 MG CAPS capsule Take 2 capsules (0.8 mg total) by mouth daily.   [DISCONTINUED] insulin lispro (HUMALOG) 100 UNIT/ML KwikPen INJECT INSULIN DOSE INTO THE SKIN BEFORE MEALS PER SLIDING SCALE. MAX DOSE PER 24 HOURS: 42 UNITS.   insulin lispro (HUMALOG) 100 UNIT/ML KwikPen Inject insulin dose into the skin before meals per sliding scale. Max dose per 24 hours: 42 units.   No facility-administered encounter medications on file as of 08/01/2023.    Surgical History: Past Surgical History:  Procedure Laterality Date   COLONOSCOPY WITH PROPOFOL N/A 07/27/2022   Procedure: COLONOSCOPY WITH PROPOFOL;  Surgeon: Toney Reil, MD;  Location: [facility];  Service: Endoscopy;  Laterality: N/A;  Diabetic   HERNIA REPAIR     2003   KNEE SURGERY     1998   MOHS SURGERY     2003   SHOULDER ARTHROSCOPY WITH OPEN ROTATOR CUFF REPAIR AND DISTAL CLAVICLE ACROMINECTOMY Left 08/11/2021   Procedure: SHOULDER ARTHROSCOPY WITH OPEN ROTATOR CUFF REPAIR AND DISTAL CLAVICLE ACROMINECTOMY;  Surgeon: Juanell Fairly, MD;  Location: [facility];  Service: Orthopedics;  Laterality: Left;    Medical History: Past Medical History:  Diagnosis Date   Aortic atherosclerosis (HCC) 07/2022  noted on CXR   Arthritis    ankles   Basal cell carcinoma    BPH (benign prostatic hyperplasia)    Complication of anesthesia    slow to wake   Diabetes (HCC)    a. diagnosed in his 40's.   Diastolic dysfunction    a. 11/2019 Echo: EF 60-65%, no rwma, nl RV fxn, no significant valvular abnormality; b. 07/2022 Echo: EF 60-65%, no rwma, GrI DD, nl RV fxn, RVSP 29.38mmHg, AoV sclerosis w/o stenosis.   Dysplastic nevus 12/11/2022   right lateral pectoral, moderate atypia   Hyperlipidemia    PAF (paroxysmal atrial  fibrillation) (HCC)    a. 07/2022 noted in the setting of DKA - brief.  CHA2DS2VASc = 3-->OAC deferred for now; b. 10/2022 Zio: Predominantly sinus rhythm at 69 (49-171).  1 6 beat run of nonsustained VT.  8 brief runs of PSVT (fastest 8 beats).  Rare PAC and PVC.  Triggered event associated with sinus rhythm.  No atrial fibrillation.   Vertigo    none for over 15 years    Family History: Family History  Problem Relation Age of Onset   Heart disease Father    Prostate cancer Neg Hx    Kidney cancer Neg Hx    Bladder Cancer Neg Hx     Social History   Socioeconomic History   Marital status: Married    Spouse name: Hilda Lias   Number of children: 4   Years of education: Not on file   Highest education level: Not on file  Occupational History   Not on file  Tobacco Use   Smoking status: Never   Smokeless tobacco: Never  Vaping Use   Vaping status: Never Used  Substance and Sexual Activity   Alcohol use: Yes    Comment: rare   Drug use: No   Sexual activity: Yes  Other Topics Concern   Not on file  Social History Narrative   Not on file   Social Drivers of Health   Financial Resource Strain: Not on file  Food Insecurity: No Food Insecurity (04/24/2023)   Hunger Vital Sign    Worried About Running Out of Food in the Last Year: Never true    Ran Out of Food in the Last Year: Never true  Transportation Needs: No Transportation Needs (04/24/2023)   PRAPARE - Administrator, Civil Service (Medical): No    Lack of Transportation (Non-Medical): No  Physical Activity: Not on file  Stress: Not on file  Social Connections: Not on file  Intimate Partner Violence: Not At Risk (04/24/2023)   Humiliation, Afraid, Rape, and Kick questionnaire    Fear of Current or Ex-Partner: No    Emotionally Abused: No    Physically Abused: No    Sexually Abused: No      Review of Systems  Constitutional:  Positive for fatigue. Negative for chills and unexpected weight change.   HENT: Negative.  Negative for congestion, postnasal drip, rhinorrhea, sneezing and sore throat.   Eyes:  Negative for redness.  Respiratory: Negative.  Negative for cough, chest tightness, shortness of breath and wheezing.   Cardiovascular: Negative.  Negative for chest pain and palpitations.  Gastrointestinal:  Negative for abdominal pain, constipation, diarrhea, nausea and vomiting.  Endocrine: Negative for polyuria.  Genitourinary:  Negative for decreased urine volume, difficulty urinating, dysuria and frequency.  Musculoskeletal:  Positive for arthralgias and gait problem (has right foot drop). Negative for back pain, joint swelling and neck pain.  Skin:  Negative for rash.  Neurological:  Positive for tremors. Negative for numbness.  Hematological:  Negative for adenopathy. Does not bruise/bleed easily.  Psychiatric/Behavioral:  Negative for behavioral problems (Depression), sleep disturbance and suicidal ideas. The patient is not nervous/anxious.     Vital Signs: BP 104/72   Pulse 82   Temp 98.1 F (36.7 C)   Resp 16   Ht 6\' 3"  (1.905 m)   Wt 214 lb 12.8 oz (97.4 kg)   SpO2 98%   BMI 26.85 kg/m    Physical Exam Vitals reviewed.  Constitutional:      General: He is not in acute distress.    Appearance: Normal appearance. He is normal weight. He is not ill-appearing.  HENT:     Head: Normocephalic and atraumatic.  Eyes:     Pupils: Pupils are equal, round, and reactive to light.  Cardiovascular:     Rate and Rhythm: Normal rate and regular rhythm.  Pulmonary:     Effort: Pulmonary effort is normal. No respiratory distress.  Neurological:     Mental Status: He is alert and oriented to person, place, and time.  Psychiatric:        Mood and Affect: Mood normal.        Behavior: Behavior normal.        Assessment/Plan: 1. Type 2 diabetes mellitus with other specified complication, with long-term current use of insulin (HCC) (Primary) A1c is improving, continue  medications as prescribed. Continue to gradually decrease lantus insulin dose as glucose levels continue to improve.  - POCT glycosylated hemoglobin (Hb A1C) - insulin lispro (HUMALOG) 100 UNIT/ML KwikPen; Inject insulin dose into the skin before meals per sliding scale. Max dose per 24 hours: 42 units.  Dispense: 15 mL; Refill: 11  2. Hypertension associated with diabetes (HCC) Continue metoprolol as prescribed   3. Hyperlipidemia associated with type 2 diabetes mellitus (HCC) Continue rosuvastatin   4. Neuropathy due to type 2 diabetes mellitus (HCC) Continue lyrica as prescribed.    General Counseling: travaughn vue understanding of the findings of todays visit and agrees with plan of treatment. I have discussed any further diagnostic evaluation that may be needed or ordered today. We also reviewed his medications today. he has been encouraged to call the office with any questions or concerns that should arise related to todays visit.    Orders Placed This Encounter  Procedures   POCT glycosylated hemoglobin (Hb A1C)    Meds ordered this encounter  Medications   insulin lispro (HUMALOG) 100 UNIT/ML KwikPen    Sig: Inject insulin dose into the skin before meals per sliding scale. Max dose per 24 hours: 42 units.    Dispense:  15 mL    Refill:  11    Dx code E11.65, uses about 1 pen per week. For future refills, keep on file.    Return in about 10 weeks (around 10/10/2023) for F/U, Recheck A1C, Jakaylee Sasaki PCP patient will pay for a1c to do early. .   Total time spent:30 Minutes Time spent includes review of chart, medications, test results, and follow up plan with the patient.    Controlled Substance Database was reviewed by me.  This patient was seen by Sallyanne Kuster, FNP-C in collaboration with Dr. Beverely Risen as a part of collaborative care agreement.   Mariaclara Spear R. Tedd Sias, MSN, FNP-C Internal medicine

## 2023-08-17 ENCOUNTER — Encounter: Payer: Self-pay | Admitting: Cardiology

## 2023-08-17 ENCOUNTER — Ambulatory Visit: Attending: Cardiology | Admitting: Cardiology

## 2023-08-17 VITALS — BP 118/68 | HR 73 | Ht 75.0 in | Wt 213.4 lb

## 2023-08-17 DIAGNOSIS — E7800 Pure hypercholesterolemia, unspecified: Secondary | ICD-10-CM | POA: Diagnosis not present

## 2023-08-17 DIAGNOSIS — R600 Localized edema: Secondary | ICD-10-CM

## 2023-08-17 DIAGNOSIS — I480 Paroxysmal atrial fibrillation: Secondary | ICD-10-CM | POA: Diagnosis not present

## 2023-08-17 DIAGNOSIS — I10 Essential (primary) hypertension: Secondary | ICD-10-CM | POA: Diagnosis not present

## 2023-08-17 DIAGNOSIS — I700 Atherosclerosis of aorta: Secondary | ICD-10-CM | POA: Diagnosis not present

## 2023-08-17 DIAGNOSIS — Z794 Long term (current) use of insulin: Secondary | ICD-10-CM

## 2023-08-17 DIAGNOSIS — E118 Type 2 diabetes mellitus with unspecified complications: Secondary | ICD-10-CM

## 2023-08-17 NOTE — Patient Instructions (Signed)
 Medication Instructions:  No changes at this time.   *If you need a refill on your cardiac medications before your next appointment, please call your pharmacy*  Lab Work: None  If you have labs (blood work) drawn today and your tests are completely normal, you will receive your results only by: MyChart Message (if you have MyChart) OR A paper copy in the mail If you have any lab test that is abnormal or we need to change your treatment, we will call you to review the results.  Testing/Procedures: None  Follow-Up: At [facility], you and your health needs are our priority.  As part of our continuing mission to provide you with exceptional heart care, our providers are all part of one team.  This team includes your primary Cardiologist (physician) and Advanced Practice Providers or APPs (Physician Assistants and Nurse Practitioners) who all work together to provide you with the care you need, when you need it.  Your next appointment:   1 year(s)  Provider:   [name], MD or Charlsie Quest, NP

## 2023-08-17 NOTE — Progress Notes (Signed)
 Cardiology Office Note:  .   Date:  08/17/2023  ID:  Michael Gross, DOB 1955/09/19, MRN [id number] PCP: Sallyanne Kuster, NP  [facility] Providers Cardiologist:  [name], MD    History of Present Illness: .   Michael Gross is a 68 y.o. male with past medical history of paroxysmal atrial fibrillation, hypertension, hyperlipidemia, type 2 diabetes, DKA, GERD, BPH, and aortic atherosclerosis presents today for follow-up for his atrial fibrillation.   Previously been admitted in March 2024 for DKA in the setting of holding his diabetic medications because independent colonoscopy.  During hospitalization he developed atrial fibrillation with RVR was treated with intravenous amiodarone with subsequent cardioversion to sinus rhythm.  High-sensitivity troponin was minimally elevated at 47 which was felt to be secondary to demand ischemia in the setting of DKA and tachycardia.  Echocardiogram showed a normal LV function with G1 DD and aortic valve sclerosis without stenosis.  Given short duration of atrial fibrillation, oral anticoagulation was deferred.  He was discharged home with on amiodarone with plan to continue for 2 months.  Subsequent ZIO monitor showed no evidence of recurrent atrial fibrillation and amiodarone was discontinued in June 2024.   He was last seen in clinic 11/17/2022 where he was doing well from a cardiac perspective.  He was unaware of any recurrence of atrial fibrillation and denied any palpitations.  He has some ankle swelling and being diltiazem was discontinued he was started on Toprol-XL 25 mg daily and continued on aspirin 81 mg daily as well.  No further testing was needed at that time.  He returns to clinic today accompanied by his wife.  He states overall he has been doing well.  He continues to suffer from neuropathy like pain to his bilateral lower extremities primarily in the night where he has to get out of the bed and walk around to make his stop  burning.  He continues to work on controlling his diabetes and has been told that it is more likely neuropathy causing his symptoms.  He denies any chest pain, shortness of breath or palpitations.  Has had no further episodes of atrial fibrillation.  Denies any recent hospitalizations or visits to the emergency department.  ROS: 10 point review of systems has been reviewed and considered negative except ones been listed in the HPI  Studies Reviewed: Marland Kitchen   EKG Interpretation Date/Time:  Friday August 17 2023 08:32:22 EDT Ventricular Rate:  73 PR Interval:  206 QRS Duration:  130 QT Interval:  404 QTC Calculation: 445 R Axis:   -27  Text Interpretation: Normal sinus rhythm Right bundle branch block When compared with ECG of 29-Jul-2022 09:30, PREVIOUS ECG IS PRESENT Confirmed by Charlsie Quest ([id number]) on 08/17/2023 8:36:37 AM    Event Monitor (Zio) 11/02/2022 Normal sinus rhythm Patient had a min HR of 49 bpm, max HR of 171 bpm, and avg HR of 69 bpm.    Bundle Branch Block/IVCD was present.  1 run of Ventricular Tachycardia occurred lasting 6 beats with a max rate of 162 bpm (avg 148 bpm).    8 Supraventricular Tachycardia runs occurred, the run with the fastest interval lasting 7 beats with a max rate of 171 bpm, the longest lasting 8 beats with an avg rate of 100 bpm.    Isolated SVEs were rare (<1.0%), SVE Couplets were rare (<1.0%), and SVE Triplets were rare (<1.0%).  Isolated VEs were rare (<1.0%), and no VE Couplets or VE Triplets were present.  Ventricular  Bigeminy was present.    Triggered events associated with normal sinus rhythm  2D echo 07/30/22 1. Left ventricular ejection fraction, by estimation, is 60 to 65%. The  left ventricle has normal function. The left ventricle has no regional  wall motion abnormalities. Left ventricular diastolic parameters are  consistent with Grade I diastolic  dysfunction (impaired relaxation).   2. Right ventricular systolic function is  normal. The right ventricular  size is normal. There is normal pulmonary artery systolic pressure. The  estimated right ventricular systolic pressure is 29.8 mmHg.   3. The mitral valve is normal in structure. No evidence of mitral valve  regurgitation. No evidence of mitral stenosis.   4. The aortic valve has an indeterminant number of cusps. There is mild  calcification of the aortic valve. Aortic valve regurgitation is not  visualized. Aortic valve sclerosis is present, with no evidence of aortic  valve stenosis.   5. The inferior vena cava is normal in size with greater than 50%  respiratory variability, suggesting right atrial pressure of 3 mmHg.   Risk Assessment/Calculations:    CHA2DS2-VASc Score =     This indicates a  % annual risk of stroke. The patient's score is based upon:             Physical Exam:   VS:  BP 118/68   Pulse 73   Ht 6\' 3"  (1.905 m)   Wt 213 lb 6.4 oz (96.8 kg)   SpO2 94%   BMI 26.67 kg/m    Wt Readings from Last 3 Encounters:  08/17/23 213 lb 6.4 oz (96.8 kg)  08/01/23 214 lb 12.8 oz (97.4 kg)  07/23/23 214 lb (97.1 kg)    GEN: Well nourished, well developed in no acute distress NECK: No JVD; No carotid bruits CARDIAC: RRR, no murmurs, rubs, gallops RESPIRATORY:  Clear to auscultation without rales, wheezing or rhonchi  ABDOMEN: Soft, non-tender, non-distended EXTREMITIES:  No edema; No deformity   ASSESSMENT AND PLAN: .   Paroxysmal atrial fibrillation previous maintain sinus rhythm was noted on EKG today with sinus rhythm rate of 73, prescribing flutter, chronic right bundle branch block, left axis deviation.  He states that he has not noted no recurrent episodes of atrial fibrillation.  Has been continued on aspirin 81 mg daily and Toprol-XL 25 mg daily.  Without any reoccurrence he has not been placed on oral anticoagulant.  Primary hypertension blood pressure today 118/68.  He has been continued on Toprol-XL 25 mg daily.  Blood pressure  remained stable.  He has been encouraged to continue to monitor his pressure 1 to 2 hours postmedication administration as well.  Hyperlipidemia with last LDL of 44.  He has been continued on ezetimibe 10 mg daily and rosuvastatin 40 mg daily.  Aortic atherosclerosis noted on chest x-ray during hospitalization in March 2024.  He is continued on low-dose aspirin, statin, and ezetimibe.  Type 2 diabetes with A1c that is slightly improved to 8.3.  He has been closely followed by his primary care provider and is continued on Jardiance, Lantus, Humalog and Ozempic.  Lower extremity edema that is improved with the discontinuation of diltiazem and started on Toprol-XL.  He has been encouraged to participate in conservative therapy of elevating his extremities, vacuum pumps, compression stockings.       Dispo: Patient return to clinic to see me/APP in 11 to 12 months or sooner if needed for further evaluation of symptoms.  Signed, Baylin Cabal, NP

## 2023-09-11 ENCOUNTER — Other Ambulatory Visit: Payer: Self-pay | Admitting: Nurse Practitioner

## 2023-09-11 DIAGNOSIS — Z794 Long term (current) use of insulin: Secondary | ICD-10-CM

## 2023-09-11 NOTE — Telephone Encounter (Signed)
 Please review

## 2023-09-12 NOTE — Telephone Encounter (Signed)
 Lmom to call us back

## 2023-10-08 ENCOUNTER — Ambulatory Visit: Admitting: Nurse Practitioner

## 2023-10-08 ENCOUNTER — Encounter: Payer: Self-pay | Admitting: Nurse Practitioner

## 2023-10-08 ENCOUNTER — Telehealth: Payer: Self-pay | Admitting: Cardiology

## 2023-10-08 VITALS — BP 130/74 | HR 85 | Temp 98.2°F | Resp 16 | Ht 75.0 in | Wt 212.6 lb

## 2023-10-08 DIAGNOSIS — Z1211 Encounter for screening for malignant neoplasm of colon: Secondary | ICD-10-CM

## 2023-10-08 DIAGNOSIS — E1159 Type 2 diabetes mellitus with other circulatory complications: Secondary | ICD-10-CM | POA: Diagnosis not present

## 2023-10-08 DIAGNOSIS — Z1212 Encounter for screening for malignant neoplasm of rectum: Secondary | ICD-10-CM

## 2023-10-08 DIAGNOSIS — E785 Hyperlipidemia, unspecified: Secondary | ICD-10-CM

## 2023-10-08 DIAGNOSIS — Z794 Long term (current) use of insulin: Secondary | ICD-10-CM

## 2023-10-08 DIAGNOSIS — I152 Hypertension secondary to endocrine disorders: Secondary | ICD-10-CM

## 2023-10-08 DIAGNOSIS — E1140 Type 2 diabetes mellitus with diabetic neuropathy, unspecified: Secondary | ICD-10-CM | POA: Diagnosis not present

## 2023-10-08 DIAGNOSIS — E1169 Type 2 diabetes mellitus with other specified complication: Secondary | ICD-10-CM

## 2023-10-08 MED ORDER — PREGABALIN 25 MG PO CAPS: 25.0000 mg | ORAL_CAPSULE | Freq: Two times a day (BID) | ORAL | 2 refills | Status: DC

## 2023-10-08 NOTE — Progress Notes (Signed)
 [facility] [address] [place], [place] [address]  Internal MEDICINE  Office Visit Note  Patient Name: Michael Gross  [id number]  [id number]  Date of Service: 10/08/2023  Chief Complaint  Patient presents with   Diabetes   Hyperlipidemia   Follow-up    HPI Michael Gross presents for a follow-up visit for screenings, diabetes, and neuropathy.  CRC screening -- due for cologuard test  Diabetes -- taking ozempic , has had a low sugar of 57, due later this month to check a1c Neuropathy -- taking pregabalin     Current Medication: Outpatient Encounter Medications as of 10/08/2023  Medication Sig   Ascorbic Acid (VITAMIN C PO) Take by mouth.   aspirin  EC 81 MG tablet Take 1 tablet (81 mg total) by mouth daily. Swallow whole.   Cholecalciferol (VITAMIN D3 PO) Take by mouth.   Cyanocobalamin  (VITAMIN B-12 PO) Take by mouth.   empagliflozin  (JARDIANCE ) 25 MG TABS tablet Take 1 tablet (25 mg total) by mouth daily.   ezetimibe  (ZETIA ) 10 MG tablet Take 1 tablet (10 mg total) by mouth daily.   insulin  glargine (LANTUS  SOLOSTAR) 100 UNIT/ML Solostar Pen Inject 34 Units into the skin at bedtime.   insulin  lispro (HUMALOG ) 100 UNIT/ML KwikPen Inject insulin  dose into the skin before meals per sliding scale. Max dose per 24 hours: 42 units.   Insulin  Pen Needle (BD PEN NEEDLE NANO U/F) 32G X 4 MM MISC Use as directed with SOLIQUA  to inject insulin  every morning E11.65   Lancets 30G MISC Use to check blood sugars 5 times per day   meloxicam  (MOBIC ) 15 MG tablet Take 1 tablet (15 mg total) by mouth daily.   metoprolol  succinate (TOPROL -XL) 25 MG 24 hr tablet Take 1 tablet (25 mg total) by mouth daily.   Multiple Vitamin (MULTIVITAMIN WITH MINERALS) TABS tablet Take 1 tablet by mouth daily.   OZEMPIC , 0.25 OR 0.5 MG/DOSE, 2 MG/3ML SOPN INJECT 0.5 MG INTO THE SKIN ONE TIME PER WEEK   pantoprazole  (PROTONIX ) 40 MG tablet Take 1 tablet (40 mg total) by mouth 2 (two) times daily.    rosuvastatin  (CRESTOR ) 40 MG tablet Take 1 tablet (40 mg total) by mouth daily.   tamsulosin  (FLOMAX ) 0.4 MG CAPS capsule Take 2 capsules (0.8 mg total) by mouth daily.   [DISCONTINUED] pregabalin  (LYRICA ) 25 MG capsule Take 1 capsule (25 mg total) by mouth 2 (two) times daily.   pregabalin  (LYRICA ) 25 MG capsule Take 1 capsule (25 mg total) by mouth 2 (two) times daily.   No facility-administered encounter medications on file as of 10/08/2023.    Surgical History: Past Surgical History:  Procedure Laterality Date   COLONOSCOPY WITH PROPOFOL  N/A 07/27/2022   Procedure: COLONOSCOPY WITH PROPOFOL ;  Surgeon: Unk Corinn Skiff, MD;  Location: [facility];  Service: Endoscopy;  Laterality: N/A;  Diabetic   HERNIA REPAIR     2003   KNEE SURGERY     1998   MOHS SURGERY     2003   SHOULDER ARTHROSCOPY WITH OPEN ROTATOR CUFF REPAIR AND DISTAL CLAVICLE ACROMINECTOMY Left 08/11/2021   Procedure: SHOULDER ARTHROSCOPY WITH OPEN ROTATOR CUFF REPAIR AND DISTAL CLAVICLE ACROMINECTOMY;  Surgeon: Marchia Drivers, MD;  Location: [facility];  Service: Orthopedics;  Laterality: Left;    Medical History: Past Medical History:  Diagnosis Date   Aortic atherosclerosis (HCC) 07/2022   noted on CXR   Arthritis    ankles   Basal cell carcinoma    BPH (benign prostatic hyperplasia)  Complication of anesthesia    slow to wake   Diabetes (HCC)    a. diagnosed in his 40's.   Diastolic dysfunction    a. 11/2019 Echo: EF 60-65%, no rwma, nl RV fxn, no significant valvular abnormality; b. 07/2022 Echo: EF 60-65%, no rwma, GrI DD, nl RV fxn, RVSP 29.82mmHg, AoV sclerosis w/o stenosis.   Dysplastic nevus 12/11/2022   right lateral pectoral, moderate atypia   Hyperlipidemia    PAF (paroxysmal atrial fibrillation) (HCC)    a. 07/2022 noted in the setting of DKA - brief.  CHA2DS2VASc = 3-->OAC deferred for now; b. 10/2022 Zio: Predominantly sinus rhythm at 69 (49-171).  1 6 beat run of nonsustained VT.  8 brief  runs of PSVT (fastest 8 beats).  Rare PAC and PVC.  Triggered event associated with sinus rhythm.  No atrial fibrillation.   Vertigo    none for over 15 years    Family History: Family History  Problem Relation Age of Onset   Heart disease Father    Prostate cancer Neg Hx    Kidney cancer Neg Hx    Bladder Cancer Neg Hx     Social History   Socioeconomic History   Marital status: Married    Spouse name: Michael Gross   Number of children: 4   Years of education: Not on file   Highest education level: Not on file  Occupational History   Not on file  Tobacco Use   Smoking status: Never   Smokeless tobacco: Never  Vaping Use   Vaping status: Never Used  Substance and Sexual Activity   Alcohol use: Yes    Comment: rare   Drug use: No   Sexual activity: Yes  Other Topics Concern   Not on file  Social History Narrative   Not on file   Social Drivers of Health   Financial Resource Strain: Not on file  Food Insecurity: No Food Insecurity (04/24/2023)   Hunger Vital Sign    Worried About Running Out of Food in the Last Year: Never true    Ran Out of Food in the Last Year: Never true  Transportation Needs: No Transportation Needs (04/24/2023)   PRAPARE - Administrator, Civil Service (Medical): No    Lack of Transportation (Non-Medical): No  Physical Activity: Not on file  Stress: Not on file  Social Connections: Not on file  Intimate Partner Violence: Not At Risk (04/24/2023)   Humiliation, Afraid, Rape, and Kick questionnaire    Fear of Current or Ex-Partner: No    Emotionally Abused: No    Physically Abused: No    Sexually Abused: No      Review of Systems  Constitutional:  Positive for fatigue. Negative for chills and unexpected weight change.  HENT: Negative.  Negative for congestion, postnasal drip, rhinorrhea, sneezing and sore throat.   Eyes:  Negative for redness.  Respiratory: Negative.  Negative for cough, chest tightness, shortness of breath and  wheezing.   Cardiovascular: Negative.  Negative for chest pain and palpitations.  Gastrointestinal:  Negative for abdominal pain, constipation, diarrhea, nausea and vomiting.  Endocrine: Negative for polyuria.  Genitourinary:  Negative for decreased urine volume, difficulty urinating, dysuria and frequency.  Musculoskeletal:  Positive for arthralgias and gait problem (has right foot drop). Negative for back pain, joint swelling and neck pain.  Skin:  Negative for rash.  Neurological:  Positive for tremors. Negative for numbness.  Hematological:  Negative for adenopathy. Does not bruise/bleed easily.  Psychiatric/Behavioral:  Negative for behavioral problems (Depression), sleep disturbance and suicidal ideas. The patient is not nervous/anxious.     Vital Signs: BP 130/74   Pulse 85   Temp 98.2 F (36.8 C)   Resp 16   Ht 6' 3 (1.905 m)   Wt 212 lb 9.6 oz (96.4 kg)   SpO2 97%   BMI 26.57 kg/m    Physical Exam Vitals reviewed.  Constitutional:      General: He is not in acute distress.    Appearance: Normal appearance. He is normal weight. He is not ill-appearing.  HENT:     Head: Normocephalic and atraumatic.   Eyes:     Pupils: Pupils are equal, round, and reactive to light.    Cardiovascular:     Rate and Rhythm: Normal rate and regular rhythm.  Pulmonary:     Effort: Pulmonary effort is normal. No respiratory distress.   Neurological:     Mental Status: He is alert and oriented to person, place, and time.   Psychiatric:        Mood and Affect: Mood normal.        Behavior: Behavior normal.        Assessment/Plan: 1. Type 2 diabetes mellitus with other specified complication, with long-term current use of insulin  (HCC) (Primary) Continue jardiance  and ozempic  as prescribed.   2. Hypertension associated with diabetes (HCC) Continue metoprolol  as prescribed.   3. Hyperlipidemia associated with type 2 diabetes mellitus (HCC) Continue rosuvastatin  and zetia   as prescribed.   4. Neuropathy due to type 2 diabetes mellitus (HCC) Continue pregabalin  as prescribed.  - pregabalin  (LYRICA ) 25 MG capsule; Take 1 capsule (25 mg total) by mouth 2 (two) times daily.  Dispense: 60 capsule; Refill: 2  5. Screening for colorectal cancer Cologuard test ordered  - Cologuard   General Counseling: antionne enrique understanding of the findings of todays visit and agrees with plan of treatment. I have discussed any further diagnostic evaluation that may be needed or ordered today. We also reviewed his medications today. he has been encouraged to call the office with any questions or concerns that should arise related to todays visit.    Orders Placed This Encounter  Procedures   Cologuard    Meds ordered this encounter  Medications   pregabalin  (LYRICA ) 25 MG capsule    Sig: Take 1 capsule (25 mg total) by mouth 2 (two) times daily.    Dispense:  60 capsule    Refill:  2    Return in about 4 months (around 02/07/2024) for F/U, Briseidy Spark PCP and need nurse visit in early july for a1c check .   Total time spent:30 Minutes Time spent includes review of chart, medications, test results, and follow up plan with the patient.   [place] Controlled Substance Database was reviewed by me.  This patient was seen by Mardy Maxin, FNP-C in collaboration with Dr. Sigrid Bathe as a part of collaborative care agreement.   Leaf Kernodle R. Maxin, MSN, FNP-C Internal medicine

## 2023-10-08 NOTE — Telephone Encounter (Signed)
 Patient needs paperwork/note stating he needs documentation if he is able to return to work/drive. 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 Patient in lobby.

## 2023-10-08 NOTE — Telephone Encounter (Signed)
 Patient needs documentation no later that next mon June 9. Can be called at both numbers in system once ready to be picked up.

## 2023-10-08 NOTE — Telephone Encounter (Signed)
 Patient needs paperwork/note stating he needs documentation if he is able to return to work/drive. 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

## 2023-10-09 ENCOUNTER — Other Ambulatory Visit: Payer: Self-pay | Admitting: Nurse Practitioner

## 2023-10-09 DIAGNOSIS — E1169 Type 2 diabetes mellitus with other specified complication: Secondary | ICD-10-CM

## 2023-10-10 ENCOUNTER — Ambulatory Visit: Admitting: Nurse Practitioner

## 2023-10-10 ENCOUNTER — Telehealth: Payer: Self-pay | Admitting: Nurse Practitioner

## 2023-10-10 NOTE — Telephone Encounter (Signed)
 Lvm for patient to return my call regarding DOT letter-Michael Gross

## 2023-10-15 ENCOUNTER — Telehealth: Payer: Self-pay | Admitting: Nurse Practitioner

## 2023-10-15 NOTE — Telephone Encounter (Signed)
 Patient p/u letter-Toni

## 2023-10-21 LAB — COLOGUARD: COLOGUARD: NEGATIVE

## 2023-11-04 ENCOUNTER — Encounter: Payer: Self-pay | Admitting: Nurse Practitioner

## 2023-11-05 ENCOUNTER — Ambulatory Visit (INDEPENDENT_AMBULATORY_CARE_PROVIDER_SITE_OTHER): Admitting: Nurse Practitioner

## 2023-11-05 DIAGNOSIS — Z794 Long term (current) use of insulin: Secondary | ICD-10-CM

## 2023-11-05 DIAGNOSIS — E1169 Type 2 diabetes mellitus with other specified complication: Secondary | ICD-10-CM

## 2023-11-05 LAB — POCT GLYCOSYLATED HEMOGLOBIN (HGB A1C): Hemoglobin A1C: 8.1 % — AB (ref 4.0–5.6)

## 2023-12-18 ENCOUNTER — Other Ambulatory Visit: Payer: Self-pay | Admitting: Nurse Practitioner

## 2023-12-18 DIAGNOSIS — E1169 Type 2 diabetes mellitus with other specified complication: Secondary | ICD-10-CM

## 2023-12-18 DIAGNOSIS — Z794 Long term (current) use of insulin: Secondary | ICD-10-CM

## 2024-01-02 ENCOUNTER — Other Ambulatory Visit: Payer: Self-pay | Admitting: Nurse Practitioner

## 2024-01-02 DIAGNOSIS — E1169 Type 2 diabetes mellitus with other specified complication: Secondary | ICD-10-CM

## 2024-01-02 DIAGNOSIS — Z760 Encounter for issue of repeat prescription: Secondary | ICD-10-CM

## 2024-01-07 ENCOUNTER — Other Ambulatory Visit: Payer: Self-pay | Admitting: Nurse Practitioner

## 2024-01-07 DIAGNOSIS — Z794 Long term (current) use of insulin: Secondary | ICD-10-CM

## 2024-01-14 ENCOUNTER — Other Ambulatory Visit: Payer: Self-pay | Admitting: Nurse Practitioner

## 2024-01-14 DIAGNOSIS — E1140 Type 2 diabetes mellitus with diabetic neuropathy, unspecified: Secondary | ICD-10-CM

## 2024-01-15 ENCOUNTER — Other Ambulatory Visit: Payer: Self-pay | Admitting: Nurse Practitioner

## 2024-01-15 DIAGNOSIS — Z79899 Other long term (current) drug therapy: Secondary | ICD-10-CM

## 2024-01-22 ENCOUNTER — Ambulatory Visit: Payer: Medicare Other | Admitting: Dermatology

## 2024-01-23 ENCOUNTER — Ambulatory Visit: Admitting: Urology

## 2024-01-31 ENCOUNTER — Other Ambulatory Visit: Payer: Self-pay | Admitting: Nurse Practitioner

## 2024-01-31 DIAGNOSIS — N401 Enlarged prostate with lower urinary tract symptoms: Secondary | ICD-10-CM

## 2024-01-31 DIAGNOSIS — Z79899 Other long term (current) drug therapy: Secondary | ICD-10-CM

## 2024-02-07 ENCOUNTER — Ambulatory Visit (INDEPENDENT_AMBULATORY_CARE_PROVIDER_SITE_OTHER): Admitting: Nurse Practitioner

## 2024-02-07 ENCOUNTER — Encounter: Payer: Self-pay | Admitting: Nurse Practitioner

## 2024-02-07 VITALS — BP 136/70 | HR 78 | Temp 98.0°F | Resp 16 | Ht 75.0 in | Wt 211.4 lb

## 2024-02-07 DIAGNOSIS — E1140 Type 2 diabetes mellitus with diabetic neuropathy, unspecified: Secondary | ICD-10-CM | POA: Diagnosis not present

## 2024-02-07 DIAGNOSIS — Z125 Encounter for screening for malignant neoplasm of prostate: Secondary | ICD-10-CM | POA: Diagnosis not present

## 2024-02-07 DIAGNOSIS — E785 Hyperlipidemia, unspecified: Secondary | ICD-10-CM

## 2024-02-07 DIAGNOSIS — E1159 Type 2 diabetes mellitus with other circulatory complications: Secondary | ICD-10-CM | POA: Diagnosis not present

## 2024-02-07 DIAGNOSIS — I152 Hypertension secondary to endocrine disorders: Secondary | ICD-10-CM

## 2024-02-07 DIAGNOSIS — E1169 Type 2 diabetes mellitus with other specified complication: Secondary | ICD-10-CM

## 2024-02-07 DIAGNOSIS — Z794 Long term (current) use of insulin: Secondary | ICD-10-CM | POA: Diagnosis not present

## 2024-02-07 LAB — POCT GLYCOSYLATED HEMOGLOBIN (HGB A1C): Hemoglobin A1C: 8.2 % — AB (ref 4.0–5.6)

## 2024-02-07 MED ORDER — EMPAGLIFLOZIN 25 MG PO TABS
25.0000 mg | ORAL_TABLET | Freq: Every day | ORAL | 5 refills | Status: AC
Start: 2024-02-07 — End: ?

## 2024-02-07 MED ORDER — OZEMPIC (1 MG/DOSE) 4 MG/3ML ~~LOC~~ SOPN: 1.0000 mg | PEN_INJECTOR | SUBCUTANEOUS | 3 refills | Status: AC

## 2024-02-07 NOTE — Progress Notes (Unsigned)
 [facility] [address] [place], [place] [address]  Internal MEDICINE  Office Visit Note  Patient Name: Michael Gross  [id number]  [id number]  Date of Service: 02/07/2024  Chief Complaint  Patient presents with   Diabetes   Hyperlipidemia   Follow-up    HPI Michael Gross presents for a follow-up visit for  Diabetes --  Hypertension -- High cholesterol --     Current Medication: Outpatient Encounter Medications as of 02/07/2024  Medication Sig   Ascorbic Acid (VITAMIN C PO) Take by mouth.   aspirin  EC 81 MG tablet Take 1 tablet (81 mg total) by mouth daily. Swallow whole.   Cholecalciferol (VITAMIN D3 PO) Take by mouth.   Cyanocobalamin  (VITAMIN B-12 PO) Take by mouth.   EMBECTA PEN NEEDLE NANO 2 GEN 32G X 4 MM MISC USE AS DIRECTED WITH SOLIQUA  TO INJECT INSULIN  EVERY MORNING E11.65   empagliflozin  (JARDIANCE ) 25 MG TABS tablet Take 1 tablet (25 mg total) by mouth daily.   ezetimibe  (ZETIA ) 10 MG tablet TAKE 1 TABLET BY MOUTH EVERY DAY   insulin  glargine (LANTUS  SOLOSTAR) 100 UNIT/ML Solostar Pen Inject 34 Units into the skin at bedtime.   insulin  lispro (HUMALOG ) 100 UNIT/ML KwikPen INJECT INSULIN  DOSE INTO THE SKIN BEFORE MEALS PER SLIDING SCALE. MAX DOSE PER 24 HOURS: 42 UNITS.   Lancets 30G MISC Use to check blood sugars 5 times per day   meloxicam  (MOBIC ) 15 MG tablet TAKE 1 TABLET (15 MG TOTAL) BY MOUTH DAILY.   metoprolol  succinate (TOPROL -XL) 25 MG 24 hr tablet Take 1 tablet (25 mg total) by mouth daily.   Multiple Vitamin (MULTIVITAMIN WITH MINERALS) TABS tablet Take 1 tablet by mouth daily.   OZEMPIC , 0.25 OR 0.5 MG/DOSE, 2 MG/3ML SOPN INJECT 0.5 MG INTO THE SKIN ONE TIME PER WEEK   pantoprazole  (PROTONIX ) 40 MG tablet TAKE 1 TABLET BY MOUTH TWICE A DAY   pregabalin  (LYRICA ) 25 MG capsule TAKE 1 CAPSULE BY MOUTH 2 TIMES DAILY.   rosuvastatin  (CRESTOR ) 40 MG tablet TAKE 1 TABLET BY MOUTH EVERY DAY   tamsulosin  (FLOMAX ) 0.4 MG CAPS capsule TAKE 2  CAPSULES BY MOUTH EVERY DAY   No facility-administered encounter medications on file as of 02/07/2024.    Surgical History: Past Surgical History:  Procedure Laterality Date   COLONOSCOPY WITH PROPOFOL  N/A 07/27/2022   Procedure: COLONOSCOPY WITH PROPOFOL ;  Surgeon: Unk Corinn Skiff, MD;  Location: [facility];  Service: Endoscopy;  Laterality: N/A;  Diabetic   HERNIA REPAIR     2003   KNEE SURGERY     1998   MOHS SURGERY     2003   SHOULDER ARTHROSCOPY WITH OPEN ROTATOR CUFF REPAIR AND DISTAL CLAVICLE ACROMINECTOMY Left 08/11/2021   Procedure: SHOULDER ARTHROSCOPY WITH OPEN ROTATOR CUFF REPAIR AND DISTAL CLAVICLE ACROMINECTOMY;  Surgeon: Marchia Drivers, MD;  Location: [facility];  Service: Orthopedics;  Laterality: Left;    Medical History: Past Medical History:  Diagnosis Date   Aortic atherosclerosis 07/2022   noted on CXR   Arthritis    ankles   Basal cell carcinoma    BPH (benign prostatic hyperplasia)    Complication of anesthesia    slow to wake   Diabetes (HCC)    a. diagnosed in his 40's.   Diastolic dysfunction    a. 11/2019 Echo: EF 60-65%, no rwma, nl RV fxn, no significant valvular abnormality; b. 07/2022 Echo: EF 60-65%, no rwma, GrI DD, nl RV fxn, RVSP 29.25mmHg, AoV sclerosis w/o  stenosis.   Dysplastic nevus 12/11/2022   right lateral pectoral, moderate atypia   Hyperlipidemia    PAF (paroxysmal atrial fibrillation) (HCC)    a. 07/2022 noted in the setting of DKA - brief.  CHA2DS2VASc = 3-->OAC deferred for now; b. 10/2022 Zio: Predominantly sinus rhythm at 69 (49-171).  1 6 beat run of nonsustained VT.  8 brief runs of PSVT (fastest 8 beats).  Rare PAC and PVC.  Triggered event associated with sinus rhythm.  No atrial fibrillation.   Vertigo    none for over 15 years    Family History: Family History  Problem Relation Age of Onset   Heart disease Father    Prostate cancer Neg Hx    Kidney cancer Neg Hx    Bladder Cancer Neg Hx     Social History    Socioeconomic History   Marital status: Married    Spouse name: [name]   Number of children: 4   Years of education: Not on file   Highest education level: Not on file  Occupational History   Not on file  Tobacco Use   Smoking status: Never   Smokeless tobacco: Never  Vaping Use   Vaping status: Never Used  Substance and Sexual Activity   Alcohol use: Yes    Comment: rare   Drug use: No   Sexual activity: Yes  Other Topics Concern   Not on file  Social History Narrative   Not on file   Social Drivers of Health   Financial Resource Strain: Not on file  Food Insecurity: No Food Insecurity (04/24/2023)   Hunger Vital Sign    Worried About Running Out of Food in the Last Year: Never true    Ran Out of Food in the Last Year: Never true  Transportation Needs: No Transportation Needs (04/24/2023)   PRAPARE - Administrator, Civil Service (Medical): No    Lack of Transportation (Non-Medical): No  Physical Activity: Not on file  Stress: Not on file  Social Connections: Not on file  Intimate Partner Violence: Not At Risk (04/24/2023)   Humiliation, Afraid, Rape, and Kick questionnaire    Fear of Current or Ex-Partner: No    Emotionally Abused: No    Physically Abused: No    Sexually Abused: No      Review of Systems  Vital Signs: BP 136/70   Pulse 78   Temp 98 F (36.7 C)   Resp 16   Ht 6' 3 (1.905 m)   Wt 211 lb 6.4 oz (95.9 kg)   SpO2 95%   BMI 26.42 kg/m    Physical Exam     Assessment/Plan:   General Counseling: Michael Gross verbalizes understanding of the findings of todays visit and agrees with plan of treatment. I have discussed any further diagnostic evaluation that may be needed or ordered today. We also reviewed his medications today. he has been encouraged to call the office with any questions or concerns that should arise related to todays visit.    Orders Placed This Encounter  Procedures   POCT glycosylated hemoglobin (Hb A1C)     No orders of the defined types were placed in this encounter.   No follow-ups on file.   Total time spent:*** Minutes Time spent includes review of chart, medications, test results, and follow up plan with the patient.   [place] Controlled Substance Database was reviewed by me.  This patient was seen by Mardy Maxin, FNP-C in collaboration with Dr. Fozia  Fernand as a part of collaborative care agreement.   Avin Gibbons R. Liana, MSN, FNP-C Internal medicine

## 2024-02-18 ENCOUNTER — Other Ambulatory Visit: Payer: Self-pay | Admitting: Nurse Practitioner

## 2024-02-18 DIAGNOSIS — E1140 Type 2 diabetes mellitus with diabetic neuropathy, unspecified: Secondary | ICD-10-CM

## 2024-03-11 ENCOUNTER — Encounter: Payer: Self-pay | Admitting: Nurse Practitioner

## 2024-03-25 ENCOUNTER — Telehealth: Payer: Self-pay

## 2024-04-01 NOTE — Telephone Encounter (Signed)
 Also pt like to list of med for insulin  so he can call his insurance

## 2024-04-09 ENCOUNTER — Telehealth: Payer: Self-pay

## 2024-04-10 ENCOUNTER — Ambulatory Visit: Admitting: Physician Assistant

## 2024-04-10 ENCOUNTER — Encounter: Payer: Self-pay | Admitting: Physician Assistant

## 2024-04-10 VITALS — BP 120/70 | HR 73 | Temp 98.0°F | Resp 16 | Ht 75.0 in | Wt 213.0 lb

## 2024-04-10 DIAGNOSIS — E1140 Type 2 diabetes mellitus with diabetic neuropathy, unspecified: Secondary | ICD-10-CM | POA: Diagnosis not present

## 2024-04-10 DIAGNOSIS — R238 Other skin changes: Secondary | ICD-10-CM

## 2024-04-10 DIAGNOSIS — M79675 Pain in left toe(s): Secondary | ICD-10-CM

## 2024-04-10 NOTE — Progress Notes (Signed)
 [facility] [address] [place], [place] [address]  Internal MEDICINE  Office Visit Note  Patient Name: Michael Gross  [id number]  [id number]  Date of Service: 04/10/2024  Chief Complaint  Patient presents with   Acute Visit    Toe pain - broke toe about a year ago. Now turning blue/black, has neuropathy.     HPI Pt is here for a sick visit. -Started having toe pain and toe changed color after walking in rain parking cars for hours 2 days ago -became black/blue yesterday -states he took a lancet with peroxide and poked it several times and seemed to get a better after awhile -It is much improved today -he has significant diabetic neuropathy so he does not necessarily feel temp change or wetness and thinks his shoes got wet when outside in the the cold rain on Tuesday  -he has broken this toe in the past, but no new injury. Deformity consistent with previous and states it is unchanged.  -He has had this happen with a different toe in the past where it turned blue as well -he saw podiatry in the past as well, but has not seen vascular  Current Medication:  Outpatient Encounter Medications as of 04/10/2024  Medication Sig   Ascorbic Acid (VITAMIN C PO) Take by mouth.   aspirin  EC 81 MG tablet Take 1 tablet (81 mg total) by mouth daily. Swallow whole.   Cholecalciferol (VITAMIN D3 PO) Take by mouth.   Cyanocobalamin  (VITAMIN B-12 PO) Take by mouth.   EMBECTA PEN NEEDLE NANO 2 GEN 32G X 4 MM MISC USE AS DIRECTED WITH SOLIQUA  TO INJECT INSULIN  EVERY MORNING E11.65   empagliflozin  (JARDIANCE ) 25 MG TABS tablet Take 1 tablet (25 mg total) by mouth daily.   ezetimibe  (ZETIA ) 10 MG tablet TAKE 1 TABLET BY MOUTH EVERY DAY   insulin  glargine (LANTUS  SOLOSTAR) 100 UNIT/ML Solostar Pen Inject 34 Units into the skin at bedtime.   insulin  lispro (HUMALOG ) 100 UNIT/ML KwikPen INJECT INSULIN  DOSE INTO THE SKIN BEFORE MEALS PER SLIDING SCALE. MAX DOSE PER 24 HOURS: 42 UNITS.    Lancets 30G MISC Use to check blood sugars 5 times per day   meloxicam  (MOBIC ) 15 MG tablet TAKE 1 TABLET (15 MG TOTAL) BY MOUTH DAILY.   metoprolol  succinate (TOPROL -XL) 25 MG 24 hr tablet Take 1 tablet (25 mg total) by mouth daily.   Multiple Vitamin (MULTIVITAMIN WITH MINERALS) TABS tablet Take 1 tablet by mouth daily.   pantoprazole  (PROTONIX ) 40 MG tablet TAKE 1 TABLET BY MOUTH TWICE A DAY   pregabalin  (LYRICA ) 25 MG capsule TAKE 1 CAPSULE BY MOUTH 2 TIMES DAILY.   rosuvastatin  (CRESTOR ) 40 MG tablet TAKE 1 TABLET BY MOUTH EVERY DAY   Semaglutide , 1 MG/DOSE, (OZEMPIC , 1 MG/DOSE,) 4 MG/3ML SOPN Inject 1 mg into the skin once a week.   tamsulosin  (FLOMAX ) 0.4 MG CAPS capsule TAKE 2 CAPSULES BY MOUTH EVERY DAY   No facility-administered encounter medications on file as of 04/10/2024.      Medical History: Past Medical History:  Diagnosis Date   Aortic atherosclerosis 07/2022   noted on CXR   Arthritis    ankles   Basal cell carcinoma    BPH (benign prostatic hyperplasia)    Complication of anesthesia    slow to wake   Diabetes (HCC)    a. diagnosed in his 40's.   Diastolic dysfunction    a. 11/2019 Echo: EF 60-65%, no rwma, nl RV fxn, no  significant valvular abnormality; b. 07/2022 Echo: EF 60-65%, no rwma, GrI DD, nl RV fxn, RVSP 29.89mmHg, AoV sclerosis w/o stenosis.   Dysplastic nevus 12/11/2022   right lateral pectoral, moderate atypia   Hyperlipidemia    PAF (paroxysmal atrial fibrillation) (HCC)    a. 07/2022 noted in the setting of DKA - brief.  CHA2DS2VASc = 3-->OAC deferred for now; b. 10/2022 Zio: Predominantly sinus rhythm at 69 (49-171).  1 6 beat run of nonsustained VT.  8 brief runs of PSVT (fastest 8 beats).  Rare PAC and PVC.  Triggered event associated with sinus rhythm.  No atrial fibrillation.   Vertigo    none for over 15 years     Vital Signs: BP 120/70   Pulse 73   Temp 98 F (36.7 C)   Resp 16   Ht 6' 3 (1.905 m)   Wt 213 lb (96.6 kg)   SpO2 98%    BMI 26.62 kg/m    Review of Systems  Constitutional:  Negative for fatigue and fever.  HENT:  Negative for congestion, mouth sores and postnasal drip.   Respiratory:  Negative for cough.   Cardiovascular:  Negative for chest pain.  Genitourinary:  Negative for flank pain.  Skin:  Positive for color change. Negative for rash.  Psychiatric/Behavioral: Negative.      Physical Exam Vitals reviewed.  Constitutional:      General: He is not in acute distress.    Appearance: Normal appearance. He is normal weight. He is not ill-appearing.  HENT:     Head: Normocephalic and atraumatic.  Eyes:     Extraocular Movements: Extraocular movements intact.  Cardiovascular:     Rate and Rhythm: Normal rate and regular rhythm.  Pulmonary:     Effort: Pulmonary effort is normal. No respiratory distress.  Skin:    Comments: Discoloration of left second toe with deformity present. Pin prick marks seen and blue near nail bed with some redness near base of toe. Reduced sensation throughout. No drainage or ulcers.  Neurological:     Mental Status: He is alert and oriented to person, place, and time.  Psychiatric:        Mood and Affect: Mood normal.        Behavior: Behavior normal.       Assessment/Plan: 1. Change of skin color (Primary) Suspect reduced circulation in addition to neuropathy contributing to color changes in toe. Improved today, but still some discoloration. Will refer to vascular for further evaluation as patient is higher risk with diabetes for complications. Advised to monitor closely - Ambulatory referral to Vascular Surgery  2. Toe pain, left Improving, pt with reduced sensation though. Will refer to vascular due to circulation concerns, but may need to revisit podiatry in future given hx of broken toe with deformity still present  3. Neuropathy due to type 2 diabetes mellitus [facility]) - Ambulatory referral to Vascular Surgery   General Counseling: Michael Gross  understanding of the findings of todays visit and agrees with plan of treatment. I have discussed any further diagnostic evaluation that may be needed or ordered today. We also reviewed his medications today. he has been encouraged to call the office with any questions or concerns that should arise related to todays visit.    Counseling:    Orders Placed This Encounter  Procedures   Ambulatory referral to Vascular Surgery    No orders of the defined types were placed in this encounter.   Time spent:30 Minutes

## 2024-04-14 ENCOUNTER — Other Ambulatory Visit: Payer: Self-pay | Admitting: Nurse Practitioner

## 2024-04-14 DIAGNOSIS — E1169 Type 2 diabetes mellitus with other specified complication: Secondary | ICD-10-CM

## 2024-04-15 ENCOUNTER — Telehealth: Payer: Self-pay | Admitting: Physician Assistant

## 2024-04-15 NOTE — Telephone Encounter (Signed)
 Left voicemail for someone with [facility] to return my call regarding urgent referral sent 04/10/24-Toni

## 2024-04-17 ENCOUNTER — Ambulatory Visit (INDEPENDENT_AMBULATORY_CARE_PROVIDER_SITE_OTHER): Admitting: Nurse Practitioner

## 2024-04-17 ENCOUNTER — Ambulatory Visit (INDEPENDENT_AMBULATORY_CARE_PROVIDER_SITE_OTHER)

## 2024-04-17 ENCOUNTER — Other Ambulatory Visit (INDEPENDENT_AMBULATORY_CARE_PROVIDER_SITE_OTHER): Payer: Self-pay | Admitting: Nurse Practitioner

## 2024-04-17 ENCOUNTER — Encounter (INDEPENDENT_AMBULATORY_CARE_PROVIDER_SITE_OTHER): Payer: Self-pay | Admitting: Nurse Practitioner

## 2024-04-17 VITALS — BP 101/66 | HR 134 | Ht 76.0 in | Wt 213.8 lb

## 2024-04-17 DIAGNOSIS — I739 Peripheral vascular disease, unspecified: Secondary | ICD-10-CM | POA: Diagnosis not present

## 2024-04-17 DIAGNOSIS — L819 Disorder of pigmentation, unspecified: Secondary | ICD-10-CM | POA: Diagnosis not present

## 2024-04-17 DIAGNOSIS — E1169 Type 2 diabetes mellitus with other specified complication: Secondary | ICD-10-CM | POA: Diagnosis not present

## 2024-04-17 DIAGNOSIS — E785 Hyperlipidemia, unspecified: Secondary | ICD-10-CM

## 2024-04-17 NOTE — Telephone Encounter (Signed)
 done

## 2024-04-19 LAB — CBC WITH DIFFERENTIAL/PLATELET
Basophils Absolute: 0 x10E3/uL (ref 0.0–0.2)
Basos: 1 %
EOS (ABSOLUTE): 0.1 x10E3/uL (ref 0.0–0.4)
Eos: 2 %
Hematocrit: 44.2 % (ref 37.5–51.0)
Hemoglobin: 14.6 g/dL (ref 13.0–17.7)
Immature Grans (Abs): 0 x10E3/uL (ref 0.0–0.1)
Immature Granulocytes: 0 %
Lymphocytes Absolute: 2.6 x10E3/uL (ref 0.7–3.1)
Lymphs: 33 %
MCH: 27.2 pg (ref 26.6–33.0)
MCHC: 33 g/dL (ref 31.5–35.7)
MCV: 82 fL (ref 79–97)
Monocytes Absolute: 0.6 x10E3/uL (ref 0.1–0.9)
Monocytes: 7 %
Neutrophils Absolute: 4.5 x10E3/uL (ref 1.4–7.0)
Neutrophils: 57 %
Platelets: 209 x10E3/uL (ref 150–450)
RBC: 5.37 x10E6/uL (ref 4.14–5.80)
RDW: 15.6 % — ABNORMAL HIGH (ref 11.6–15.4)
WBC: 7.9 x10E3/uL (ref 3.4–10.8)

## 2024-04-19 LAB — CMP14+EGFR
ALT: 30 IU/L (ref 0–44)
AST: 37 IU/L (ref 0–40)
Albumin: 4.3 g/dL (ref 3.9–4.9)
Alkaline Phosphatase: 76 IU/L (ref 47–123)
BUN/Creatinine Ratio: 20 (ref 10–24)
BUN: 19 mg/dL (ref 8–27)
Bilirubin Total: 0.4 mg/dL (ref 0.0–1.2)
CO2: 22 mmol/L (ref 20–29)
Calcium: 9.4 mg/dL (ref 8.6–10.2)
Chloride: 106 mmol/L (ref 96–106)
Creatinine, Ser: 0.94 mg/dL (ref 0.76–1.27)
Globulin, Total: 2.7 g/dL (ref 1.5–4.5)
Glucose: 91 mg/dL (ref 70–99)
Potassium: 4.4 mmol/L (ref 3.5–5.2)
Sodium: 143 mmol/L (ref 134–144)
Total Protein: 7 g/dL (ref 6.0–8.5)
eGFR: 88 mL/min/1.73 (ref >59)

## 2024-04-19 LAB — LIPID PANEL
Chol/HDL Ratio: 2.3 ratio (ref 0.0–5.0)
Cholesterol, Total: 112 mg/dL (ref 100–199)
HDL: 49 mg/dL (ref >39)
LDL Chol Calc (NIH): 49 mg/dL (ref 0–99)
Triglycerides: 62 mg/dL (ref 0–149)
VLDL Cholesterol Cal: 14 mg/dL (ref 5–40)

## 2024-04-19 LAB — PSA TOTAL (REFLEX TO FREE): Prostate Specific Ag, Serum: 3.2 ng/mL (ref 0.0–4.0)

## 2024-04-19 LAB — HGB A1C W/O EAG: Hgb A1c MFr Bld: 8.7 % — ABNORMAL HIGH (ref 4.8–5.6)

## 2024-04-20 ENCOUNTER — Telehealth: Payer: Self-pay | Admitting: Nurse Practitioner

## 2024-04-20 NOTE — Telephone Encounter (Signed)
 Patient called Friday evening requesting Lispro refill. Contact [name], she sent rx to pharmacy. I notified [name]

## 2024-04-21 LAB — VAS US ABI WITH/WO TBI
Left ABI: 1.26
Right ABI: 1.21

## 2024-04-22 ENCOUNTER — Telehealth: Payer: Self-pay | Admitting: Nurse Practitioner

## 2024-04-22 ENCOUNTER — Ambulatory Visit: Payer: Medicare Other | Admitting: Dermatology

## 2024-04-22 ENCOUNTER — Ambulatory Visit: Payer: Medicare Other | Admitting: Nurse Practitioner

## 2024-04-22 NOTE — Telephone Encounter (Signed)
 Lvm to move 04/24/24 appointment-Toni

## 2024-04-24 ENCOUNTER — Ambulatory Visit (INDEPENDENT_AMBULATORY_CARE_PROVIDER_SITE_OTHER): Admitting: Nurse Practitioner

## 2024-04-24 ENCOUNTER — Ambulatory Visit: Admitting: Nurse Practitioner

## 2024-04-24 ENCOUNTER — Encounter: Payer: Self-pay | Admitting: Nurse Practitioner

## 2024-04-24 VITALS — BP 116/70 | HR 86 | Temp 96.9°F | Resp 16 | Ht 76.0 in | Wt 215.2 lb

## 2024-04-24 DIAGNOSIS — E785 Hyperlipidemia, unspecified: Secondary | ICD-10-CM | POA: Diagnosis not present

## 2024-04-24 DIAGNOSIS — E1169 Type 2 diabetes mellitus with other specified complication: Secondary | ICD-10-CM

## 2024-04-24 DIAGNOSIS — Z0001 Encounter for general adult medical examination with abnormal findings: Secondary | ICD-10-CM | POA: Diagnosis not present

## 2024-04-24 DIAGNOSIS — E1159 Type 2 diabetes mellitus with other circulatory complications: Secondary | ICD-10-CM

## 2024-04-24 DIAGNOSIS — I152 Hypertension secondary to endocrine disorders: Secondary | ICD-10-CM

## 2024-04-24 DIAGNOSIS — Z794 Long term (current) use of insulin: Secondary | ICD-10-CM

## 2024-04-24 NOTE — Progress Notes (Signed)
 [facility] [address] [place], [place] [address]  Internal MEDICINE  Office Visit Note  Patient Name: Michael Gross  [id number]  [id number]  Date of Service: 04/24/2024  Chief Complaint  Patient presents with   Diabetes   Hyperlipidemia   Medicare Wellness    HPI Thornton presents for a medicare annual well visit.  Well-appearing 68 y.o. male with  Routine CRC screening: cologuard was done in June, negative  Eye exam:overdue foot exam: done, and sees podiatry Labs: labs done recently, results discussed today A1c is elevated at 8.7 Cholesterol panel is normal, on statin therapy  PSA is borderline elevated at 3.2  Low RDW, but the rest of the CBC is normal.  New or worsening pain: none  Other concerns: Issues with changes in which short acting insulin  his insurance prefers.  Takes lantus  34 units daily for basal insulin  On ozempic  1 mg weekly Need urine ACR Need DM eye exam.      04/24/2024    9:54 AM 04/19/2023    3:26 PM 04/13/2022    3:09 PM  MMSE - Mini Mental State Exam  Orientation to time 5 5 5   Orientation to Place 5 5 5   Registration 3 3 3   Attention/ Calculation 5 5 5   Recall 3 3 3   Language- name 2 objects 2 2 2   Language- repeat 1 1 1   Language- follow 3 step command 3 3 3   Language- read & follow direction 1 1 1   Write a sentence 1 1 1   Copy design 1 1 1   Total score 30 30 30     Functional Status Survey: Is the patient deaf or have difficulty hearing?: No Does the patient have difficulty seeing, even when wearing glasses/contacts?: No Does the patient have difficulty concentrating, remembering, or making decisions?: No Does the patient have difficulty walking or climbing stairs?: No Does the patient have difficulty dressing or bathing?: No Does the patient have difficulty doing errands alone such as visiting a doctor's office or shopping?: No     09/21/2021    9:09 AM 04/13/2022    3:07 PM 07/13/2022    8:46 AM 04/19/2023     3:25 PM 04/24/2024    9:54 AM  Fall Risk  Falls in the past year? 1 1 0 0 0  Was there an injury with Fall? 1  1  0  0  0  Fall Risk Category Calculator 2 2 0 0 0  Fall Risk Category (Retired) Moderate  Moderate      (RETIRED) Patient Fall Risk Level Moderate fall risk  Moderate fall risk      Patient at Risk for Falls Due to   No Fall Risks No Fall Risks   Fall risk Follow up Falls evaluation completed  Falls evaluation completed  Falls evaluation completed Falls evaluation completed Falls evaluation completed     Data saved with a previous flowsheet row definition       04/24/2024    9:54 AM  Depression screen PHQ 2/9  Decreased Interest 0  Down, Depressed, Hopeless 0  PHQ - 2 Score 0       Current Medication: Outpatient Encounter Medications as of 04/24/2024  Medication Sig   Ascorbic Acid (VITAMIN C PO) Take by mouth.   aspirin  EC 81 MG tablet Take 1 tablet (81 mg total) by mouth daily. Swallow whole.   Cholecalciferol (VITAMIN D3 PO) Take by mouth.   Cyanocobalamin  (VITAMIN B-12 PO) Take by mouth.  EMBECTA PEN NEEDLE NANO 2 GEN 32G X 4 MM MISC USE AS DIRECTED WITH SOLIQUA  TO INJECT INSULIN  EVERY MORNING E11.65   empagliflozin  (JARDIANCE ) 25 MG TABS tablet Take 1 tablet (25 mg total) by mouth daily.   ezetimibe  (ZETIA ) 10 MG tablet TAKE 1 TABLET BY MOUTH EVERY DAY   insulin  glargine (LANTUS  SOLOSTAR) 100 UNIT/ML Solostar Pen Inject 34 Units into the skin at bedtime.   insulin  lispro (HUMALOG ) 100 UNIT/ML KwikPen INJECT INSULIN  DOSE INTO THE SKIN BEFORE MEALS PER SLIDING SCALE. MAX DOSE PER 24 HOURS: 42 UNITS.   Lancets 30G MISC Use to check blood sugars 5 times per day   meloxicam  (MOBIC ) 15 MG tablet TAKE 1 TABLET (15 MG TOTAL) BY MOUTH DAILY.   metoprolol  succinate (TOPROL -XL) 25 MG 24 hr tablet Take 1 tablet (25 mg total) by mouth daily.   Multiple Vitamin (MULTIVITAMIN WITH MINERALS) TABS tablet Take 1 tablet by mouth daily.   pantoprazole  (PROTONIX ) 40 MG  tablet TAKE 1 TABLET BY MOUTH TWICE A DAY   pregabalin  (LYRICA ) 25 MG capsule TAKE 1 CAPSULE BY MOUTH 2 TIMES DAILY.   rosuvastatin  (CRESTOR ) 40 MG tablet TAKE 1 TABLET BY MOUTH EVERY DAY   Semaglutide , 1 MG/DOSE, (OZEMPIC , 1 MG/DOSE,) 4 MG/3ML SOPN Inject 1 mg into the skin once a week.   tamsulosin  (FLOMAX ) 0.4 MG CAPS capsule TAKE 2 CAPSULES BY MOUTH EVERY DAY   No facility-administered encounter medications on file as of 04/24/2024.    Surgical History: Past Surgical History:  Procedure Laterality Date   COLONOSCOPY WITH PROPOFOL  N/A 07/27/2022   Procedure: COLONOSCOPY WITH PROPOFOL ;  Surgeon: Unk Corinn Skiff, MD;  Location: [facility];  Service: Endoscopy;  Laterality: N/A;  Diabetic   HERNIA REPAIR     2003   KNEE SURGERY     1998   MOHS SURGERY     2003   SHOULDER ARTHROSCOPY WITH OPEN ROTATOR CUFF REPAIR AND DISTAL CLAVICLE ACROMINECTOMY Left 08/11/2021   Procedure: SHOULDER ARTHROSCOPY WITH OPEN ROTATOR CUFF REPAIR AND DISTAL CLAVICLE ACROMINECTOMY;  Surgeon: Marchia Drivers, MD;  Location: [facility];  Service: Orthopedics;  Laterality: Left;    Medical History: Past Medical History:  Diagnosis Date   Aortic atherosclerosis 07/2022   noted on CXR   Arthritis    ankles   Basal cell carcinoma    BPH (benign prostatic hyperplasia)    Complication of anesthesia    slow to wake   Diabetes (HCC)    a. diagnosed in his 40's.   Diastolic dysfunction    a. 11/2019 Echo: EF 60-65%, no rwma, nl RV fxn, no significant valvular abnormality; b. 07/2022 Echo: EF 60-65%, no rwma, GrI DD, nl RV fxn, RVSP 29.47mmHg, AoV sclerosis w/o stenosis.   Dysplastic nevus 12/11/2022   right lateral pectoral, moderate atypia   Hyperlipidemia    PAF (paroxysmal atrial fibrillation) (HCC)    a. 07/2022 noted in the setting of DKA - brief.  CHA2DS2VASc = 3-->OAC deferred for now; b. 10/2022 Zio: Predominantly sinus rhythm at 69 (49-171).  1 6 beat run of nonsustained VT.  8 brief runs of  PSVT (fastest 8 beats).  Rare PAC and PVC.  Triggered event associated with sinus rhythm.  No atrial fibrillation.   Vertigo    none for over 15 years    Family History: Family History  Problem Relation Age of Onset   Heart disease Father    Prostate cancer Neg Hx    Kidney cancer Neg Hx  Bladder Cancer Neg Hx     Social History   Socioeconomic History   Marital status: Married    Spouse name: [name]   Number of children: 4   Years of education: Not on file   Highest education level: Not on file  Occupational History   Not on file  Tobacco Use   Smoking status: Never   Smokeless tobacco: Never  Vaping Use   Vaping status: Never Used  Substance and Sexual Activity   Alcohol use: Yes    Comment: rare   Drug use: No   Sexual activity: Yes  Other Topics Concern   Not on file  Social History Narrative   Not on file   Social Drivers of Health   Tobacco Use: Low Risk (04/24/2024)   Patient History    Smoking Tobacco Use: Never    Smokeless Tobacco Use: Never    Passive Exposure: Not on file  Financial Resource Strain: Not on file  Food Insecurity: No Food Insecurity (04/24/2023)   Hunger Vital Sign    Worried About Running Out of Food in the Last Year: Never true    Ran Out of Food in the Last Year: Never true  Transportation Needs: No Transportation Needs (04/24/2023)   PRAPARE - Administrator, Civil Service (Medical): No    Lack of Transportation (Non-Medical): No  Physical Activity: Not on file  Stress: Not on file  Social Connections: Not on file  Intimate Partner Violence: Not At Risk (04/24/2023)   Humiliation, Afraid, Rape, and Kick questionnaire    Fear of Current or Ex-Partner: No    Emotionally Abused: No    Physically Abused: No    Sexually Abused: No  Depression (PHQ2-9): Low Risk (04/24/2024)   Depression (PHQ2-9)    PHQ-2 Score: 0  Alcohol Screen: Low Risk (04/19/2023)   Alcohol Screen    Last Alcohol Screening Score (AUDIT): 0   Housing: Unknown (10/15/2023)   Received from [facility]   Epic    Unable to Pay for Housing in the Last Year: Not on file    Number of Times Moved in the Last Year: Not on file    At any time in the past 12 months, were you homeless or living in a shelter (including now)?: No  Utilities: Not At Risk (04/24/2023)   [facility] Utilities    Threatened with loss of utilities: No  Health Literacy: Not on file      Review of Systems  Constitutional:  Negative for activity change, appetite change, chills, fatigue, fever and unexpected weight change.  HENT: Negative.  Negative for congestion, ear pain, rhinorrhea, sore throat and trouble swallowing.   Eyes: Negative.   Respiratory: Negative.  Negative for cough, chest tightness, shortness of breath and wheezing.   Cardiovascular: Negative.  Negative for chest pain.  Gastrointestinal: Negative.  Negative for abdominal pain, blood in stool, constipation, diarrhea, nausea and vomiting.  Endocrine: Negative.   Genitourinary: Negative.  Negative for difficulty urinating, dysuria, frequency, hematuria and urgency.  Musculoskeletal:  Positive for arthralgias and gait problem. Negative for back pain, joint swelling, myalgias and neck pain.  Skin: Negative.  Negative for rash and wound.  Allergic/Immunologic: Negative.  Negative for immunocompromised state.  Neurological:  Negative for dizziness, seizures, numbness and headaches.  Hematological: Negative.   Psychiatric/Behavioral: Negative.  Negative for behavioral problems, self-injury and suicidal ideas. The patient is not nervous/anxious.     Vital Signs: BP 116/70   Pulse 86  Temp (!) 96.9 F (36.1 C)   Resp 16   Ht 6' 4 (1.93 m)   Wt 215 lb 3.2 oz (97.6 kg)   SpO2 95%   BMI 26.19 kg/m    Physical Exam Vitals reviewed.  Constitutional:      General: He is not in acute distress.    Appearance: Normal appearance. He is well-developed. He is obese. He is not  ill-appearing or diaphoretic.  HENT:     Head: Normocephalic and atraumatic.     Right Ear: Tympanic membrane, ear canal and external ear normal.     Left Ear: Tympanic membrane, ear canal and external ear normal.     Nose: Nose normal. No congestion or rhinorrhea.     Mouth/Throat:     Mouth: Mucous membranes are moist.     Pharynx: Oropharynx is clear. No oropharyngeal exudate or posterior oropharyngeal erythema.  Eyes:     General: No scleral icterus.       Right eye: No discharge.        Left eye: No discharge.     Extraocular Movements: Extraocular movements intact.     Conjunctiva/sclera: Conjunctivae normal.     Pupils: Pupils are equal, round, and reactive to light.  Neck:     Thyroid : No thyromegaly.     Vascular: No JVD.     Trachea: No tracheal deviation.  Cardiovascular:     Rate and Rhythm: Normal rate and regular rhythm.     Pulses: Normal pulses.          Dorsalis pedis pulses are 2+ on the right side and 2+ on the left side.       Posterior tibial pulses are 2+ on the right side and 2+ on the left side.     Heart sounds: Normal heart sounds. No murmur heard.    No friction rub. No gallop.  Pulmonary:     Effort: Pulmonary effort is normal. No respiratory distress.     Breath sounds: Normal breath sounds. No stridor. No wheezing or rales.  Chest:     Chest wall: No tenderness.  Abdominal:     General: Bowel sounds are normal. There is no distension.     Palpations: Abdomen is soft. There is no mass.     Tenderness: There is no abdominal tenderness. There is no guarding or rebound.  Musculoskeletal:        General: No tenderness or deformity. Normal range of motion.     Cervical back: Normal range of motion and neck supple.     Right foot: Normal range of motion. Bunion present. No deformity.     Left foot: Bunion present.  Feet:     Right foot:     Protective Sensation: 6 sites tested.  6 sites sensed.     Skin integrity: Callus and dry skin present.      Left foot:     Protective Sensation: 6 sites tested.  6 sites sensed.     Skin integrity: Callus and dry skin present.  Lymphadenopathy:     Cervical: No cervical adenopathy.  Skin:    General: Skin is warm and dry.     Capillary Refill: Capillary refill takes less than 2 seconds.     Coloration: Skin is not pale.     Findings: No erythema or rash.  Neurological:     Mental Status: He is alert and oriented to person, place, and time.     Cranial Nerves: No cranial nerve  deficit.     Motor: No abnormal muscle tone.     Coordination: Coordination normal.     Gait: Gait normal.     Deep Tendon Reflexes: Reflexes are normal and symmetric.  Psychiatric:        Mood and Affect: Mood normal.        Behavior: Behavior normal.        Thought Content: Thought content normal.        Judgment: Judgment normal.        Assessment/Plan: 1. Encounter for Medicare annual examination with abnormal findings (Primary) Age-appropriate preventive screenings and vaccinations discussed. Routine labs for health maintenance results discussed with the patient today. PHM updated.    2. Type 2 diabetes mellitus with other specified complication, with long-term current use of insulin  (HCC) Ozempic  dose was recently increased and patient meal time insulin  is changing. Continue medications as prescribed.   3. Hypertension associated with diabetes (HCC) Stable, continue medications as prescribed.   4. Hyperlipidemia associated with type 2 diabetes mellitus (HCC) Continue rosuvastatin  as prescribed.       General Counseling: rhea thrun understanding of the findings of todays visit and agrees with plan of treatment. I have discussed any further diagnostic evaluation that may be needed or ordered today. We also reviewed his medications today. he has been encouraged to call the office with any questions or concerns that should arise related to todays visit.    No orders of the defined types were  placed in this encounter.   No orders of the defined types were placed in this encounter.   Return in about 3 months (around 07/23/2024) for F/U, Recheck A1C, [place] [place] PCP.   Total time spent:30 Minutes Time spent includes review of chart, medications, test results, and follow up plan with the patient.   [place] Controlled Substance Database was reviewed by me.  This patient was seen by Mardy Maxin, FNP-C in collaboration with Dr. Sigrid Bathe as a part of collaborative care agreement.  Jabri Blancett R. Maxin, MSN, FNP-C Internal medicine

## 2024-04-27 ENCOUNTER — Encounter (INDEPENDENT_AMBULATORY_CARE_PROVIDER_SITE_OTHER): Payer: Self-pay | Admitting: Nurse Practitioner

## 2024-04-27 NOTE — Progress Notes (Signed)
 "  Subjective:    Patient ID: Michael Gross, male    DOB: [birth year], 68 y.o.   MRN: [id number] Chief Complaint  Patient presents with   New Patient (Initial Visit)    Urgent. np. ABI + consult. Change of skin color, toe. Type 2 diabetes  ref: McDonough,Lauren  Right 2nd toe    HPI  Discussed the use of AI scribe software for clinical note transcription with the patient, who gave verbal consent to proceed.  History of Present Illness Michael Gross is a 68 year old male with type 2 diabetes and diabetic neuropathy who presents with acute toe discoloration.  He developed acute, diffuse violaceous discoloration of the dorsal aspect of one toe following exposure to cold, wet conditions while walking outdoors. A second toe exhibited mild, focal discoloration. He suspects cold exposure contributed, as his shoes became wet during the incident. He has a remote history of prior toe fracture and recalls a similar episode in the past that resolved with conservative measures.  He managed the episode by puncturing the affected toe with a needle, applying hydrogen peroxide, and using topical ointment. The discoloration resolved within one week for the more affected toe, while the second toe improved more gradually. He expressed concern regarding potential infection if untreated. He experiences only intermittent, mild pain in the affected area. He routinely wears thin socks due to discomfort with thicker socks and recently acquired rubber galoshes to prevent further cold exposure.  He reports chronic numbness in his feet and experiences nocturnal leg cramps that occasionally require ambulation for relief. He ambulates 2.5 to 3.5 miles two to three times per week, sometimes using a golf cart.    Results Diagnostic Ankle Brachial Index (Right) (2022): 1.1 Ankle Brachial Index (Left) (2022): 1.05  Ankle Brachial Index (ABI) with Toe Pressure Measurement Right ABI 1.21, left ABI 1.26. Great  toe pressures normal at 0.94 bilaterally. Triphasic waveforms in tibial vessels and great toes. Foot warm with 2+ dorsalis pedis and posterior tibial pulses bilaterally.   Review of Systems     Objective:   Physical Exam  Physical Exam EXTREMITIES: Feet warm with strong pulses. Hair present on feet.  BP 101/66   Pulse (!) 134   Ht 6' 4 (1.93 m)   Wt 213 lb 12.8 oz (97 kg)   BMI 26.02 kg/m   Past Medical History:  Diagnosis Date   Aortic atherosclerosis 07/2022   noted on CXR   Arthritis    ankles   Basal cell carcinoma    BPH (benign prostatic hyperplasia)    Complication of anesthesia    slow to wake   Diabetes (HCC)    a. diagnosed in his 40's.   Diastolic dysfunction    a. 11/2019 Echo: EF 60-65%, no rwma, nl RV fxn, no significant valvular abnormality; b. 07/2022 Echo: EF 60-65%, no rwma, GrI DD, nl RV fxn, RVSP 29.86mmHg, AoV sclerosis w/o stenosis.   Dysplastic nevus 12/11/2022   right lateral pectoral, moderate atypia   Hyperlipidemia    PAF (paroxysmal atrial fibrillation) (HCC)    a. 07/2022 noted in the setting of DKA - brief.  CHA2DS2VASc = 3-->OAC deferred for now; b. 10/2022 Zio: Predominantly sinus rhythm at 69 (49-171).  1 6 beat run of nonsustained VT.  8 brief runs of PSVT (fastest 8 beats).  Rare PAC and PVC.  Triggered event associated with sinus rhythm.  No atrial fibrillation.   Vertigo    none for over 15 years  Social History   Socioeconomic History   Marital status: Married    Spouse name: Michael Gross   Number of children: 4   Years of education: Not on file   Highest education level: Not on file  Occupational History   Not on file  Tobacco Use   Smoking status: Never   Smokeless tobacco: Never  Vaping Use   Vaping status: Never Used  Substance and Sexual Activity   Alcohol use: Yes    Comment: rare   Drug use: No   Sexual activity: Yes  Other Topics Concern   Not on file  Social History Narrative   Not on file   Social Drivers of  Health   Tobacco Use: Low Risk (04/27/2024)   Patient History    Smoking Tobacco Use: Never    Smokeless Tobacco Use: Never    Passive Exposure: Not on file  Financial Resource Strain: Not on file  Food Insecurity: No Food Insecurity (04/24/2023)   Hunger Vital Sign    Worried About Running Out of Food in the Last Year: Never true    Ran Out of Food in the Last Year: Never true  Transportation Needs: No Transportation Needs (04/24/2023)   PRAPARE - Administrator, Civil Service (Medical): No    Lack of Transportation (Non-Medical): No  Physical Activity: Not on file  Stress: Not on file  Social Connections: Not on file  Intimate Partner Violence: Not At Risk (04/24/2023)   Humiliation, Afraid, Rape, and Kick questionnaire    Fear of Current or Ex-Partner: No    Emotionally Abused: No    Physically Abused: No    Sexually Abused: No  Depression (PHQ2-9): Low Risk (04/24/2024)   Depression (PHQ2-9)    PHQ-2 Score: 0  Alcohol Screen: Low Risk (04/19/2023)   Alcohol Screen    Last Alcohol Screening Score (AUDIT): 0  Housing: Unknown (10/15/2023)   Received from [facility]   Epic    Unable to Pay for Housing in the Last Year: Not on file    Number of Times Moved in the Last Year: Not on file    At any time in the past 12 months, were you homeless or living in a shelter (including now)?: No  Utilities: Not At Risk (04/24/2023)   [facility] Utilities    Threatened with loss of utilities: No  Health Literacy: Not on file    Past Surgical History:  Procedure Laterality Date   COLONOSCOPY WITH PROPOFOL  N/A 07/27/2022   Procedure: COLONOSCOPY WITH PROPOFOL ;  Surgeon: Unk Corinn Skiff, MD;  Location: [facility];  Service: Endoscopy;  Laterality: N/A;  Diabetic   HERNIA REPAIR     2003   KNEE SURGERY     1998   MOHS SURGERY     2003   SHOULDER ARTHROSCOPY WITH OPEN ROTATOR CUFF REPAIR AND DISTAL CLAVICLE ACROMINECTOMY Left 08/11/2021   Procedure:  SHOULDER ARTHROSCOPY WITH OPEN ROTATOR CUFF REPAIR AND DISTAL CLAVICLE ACROMINECTOMY;  Surgeon: Marchia Drivers, MD;  Location: [facility];  Service: Orthopedics;  Laterality: Left;    Family History  Problem Relation Age of Onset   Heart disease Father    Prostate cancer Neg Hx    Kidney cancer Neg Hx    Bladder Cancer Neg Hx     Allergies[1]     Latest Ref Rng & Units 04/18/2024    2:16 PM 04/24/2023    2:31 PM 04/11/2023    9:59 AM  CBC  WBC  3.4 - 10.8 x10E3/uL 7.9  5.7  6.5   Hemoglobin 13.0 - 17.7 g/dL 85.3  86.8  86.2   Hematocrit 37.5 - 51.0 % 44.2  42.4  46.4   Platelets 150 - 450 x10E3/uL 209  189  213       CMP     Component Value Date/Time   NA 143 04/18/2024 1416   K 4.4 04/18/2024 1416   CL 106 04/18/2024 1416   CO2 22 04/18/2024 1416   GLUCOSE 91 04/18/2024 1416   GLUCOSE 165 (H) 08/16/2022 1025   BUN 19 04/18/2024 1416   CREATININE 0.94 04/18/2024 1416   CALCIUM  9.4 04/18/2024 1416   PROT 7.0 04/18/2024 1416   ALBUMIN 4.3 04/18/2024 1416   AST 37 04/18/2024 1416   ALT 30 04/18/2024 1416   ALKPHOS 76 04/18/2024 1416   BILITOT 0.4 04/18/2024 1416   GFR 109.70 04/19/2015 1706   EGFR 88 04/18/2024 1416   GFRNONAA >60 08/16/2022 1025     VAS US  ABI WITH/WO TBI Result Date: 04/21/2024  LOWER EXTREMITY DOPPLER STUDY Patient Name:  LESLY JOSLYN  Date of Exam:   04/17/2024 Medical Rec #: [id number]           Accession #:    [id number] Date of Birth: 30-Mar-1956           Patient Gender: M Patient Age:   35 years Exam Location:  [facility] Procedure:      VAS US  ABI WITH/WO TBI Referring Phys: ORVIN DARING --------------------------------------------------------------------------------  Indications: Lt foot 2nd toe discoloration  Comparison Study: 09/2020 outside study NL-ABI Performing Technologist: Jerel Croak RVT  Examination Guidelines: A complete evaluation includes at minimum, Doppler waveform signals and systolic blood pressure reading  at the level of bilateral brachial, anterior tibial, and posterior tibial arteries, when vessel segments are accessible. Bilateral testing is considered an integral part of a complete examination. Photoelectric Plethysmograph (PPG) waveforms and toe systolic pressure readings are included as required and additional duplex testing as needed. Limited examinations for reoccurring indications may be performed as noted.  ABI Findings: +---------+------------------+-----+---------+--------+ Right    Rt Pressure (mmHg)IndexWaveform Comment  +---------+------------------+-----+---------+--------+ Brachial 123                                      +---------+------------------+-----+---------+--------+ PTA      151               1.21 triphasic         +---------+------------------+-----+---------+--------+ DP       144               1.15 triphasic         +---------+------------------+-----+---------+--------+ Great Toe117               0.94 Normal            +---------+------------------+-----+---------+--------+ +---------+------------------+-----+---------+-------+ Left     Lt Pressure (mmHg)IndexWaveform Comment +---------+------------------+-----+---------+-------+ Brachial 125                                     +---------+------------------+-----+---------+-------+ PTA      158               1.26 triphasic        +---------+------------------+-----+---------+-------+ DP       156  1.25 triphasic        +---------+------------------+-----+---------+-------+ [place] Toe118               0.94 Normal           +---------+------------------+-----+---------+-------+ +-------+-----------+-----------+------------+------------+ ABI/TBIToday's ABIToday's TBIPrevious ABIPrevious TBI +-------+-----------+-----------+------------+------------+ Right  1.21       .94        1.1                       +-------+-----------+-----------+------------+------------+ Left   1.26       .94        1.05                     +-------+-----------+-----------+------------+------------+ Bilateral ABIs appear essentially unchanged compared to prior study on 09/2020.  Summary: Right: Resting right ankle-brachial index is within normal range. The right toe-brachial index is normal.  Left: Resting left ankle-brachial index is within normal range. The left toe-brachial index is normal.  2nd toe shows normal waveform. *See table(s) above for measurements and observations.  Electronically signed by Cordella Shawl MD on 04/21/2024 at 8:29:06 AM.    Final        Assessment & Plan:   1. Discoloration of skin of toe (Primary) Acute toe discoloration Acute purple discoloration likely due to vasospasm from cold exposure, possibly with minor trauma or blood blister. No evidence of peripheral arterial disease or critical limb ischemia. Diabetes increases risk for vascular issues. - Reviewed normal ankle-brachial index and toe pressure studies. - Advised monitoring for recurrent or persistent discoloration, pain, or new symptoms, and to seek care if these develop. - Discussed use of baby aspirin ; he is currently taking one daily and was advised that an additional dose may be beneficial. - Recommended wearing thin socks for comfort, thicker socks in very cold conditions if tolerable. - Suggested carrying an extra pair of socks to change into if current socks become wet. - Discussed use of rubber galoshes to maintain dry feet, which he has already implemented. - Advised continued local care for future episodes, including cleaning and topical ointment as previously performed. - Provided anticipatory guidance regarding signs of vascular compromise and instructed to seek care if these occur  2. Hyperlipidemia associated with type 2 diabetes mellitus (HCC) Continue statin as ordered and reviewed, no changes at this time    Assessment and Plan Assessment & Plan .     Medications Ordered Prior to Encounter[2]  There are no Patient Instructions on file for this visit. No follow-ups on file.   Jhair Witherington E Oreoluwa Gilmer, NP      [1] No Known Allergies [2]  Current Outpatient Medications on File Prior to Visit  Medication Sig Dispense Refill   Ascorbic Acid (VITAMIN C PO) Take by mouth.     aspirin  EC 81 MG tablet Take 1 tablet (81 mg total) by mouth daily. Swallow whole. 30 tablet 0   Cholecalciferol (VITAMIN D3 PO) Take by mouth.     Cyanocobalamin  (VITAMIN B-12 PO) Take by mouth.     EMBECTA PEN NEEDLE NANO 2 GEN 32G X 4 MM MISC USE AS DIRECTED WITH SOLIQUA  TO INJECT INSULIN  EVERY MORNING E11.65 300 each 1   empagliflozin  (JARDIANCE ) 25 MG TABS tablet Take 1 tablet (25 mg total) by mouth daily. 30 tablet 5   ezetimibe  (ZETIA ) 10 MG tablet TAKE 1 TABLET BY MOUTH EVERY DAY 90 tablet 1   insulin  glargine (LANTUS  SOLOSTAR) 100 UNIT/ML Solostar Pen Inject 34  Units into the skin at bedtime. 15 mL 3   Lancets 30G MISC Use to check blood sugars 5 times per day     meloxicam  (MOBIC ) 15 MG tablet TAKE 1 TABLET (15 MG TOTAL) BY MOUTH DAILY. 90 tablet 1   metoprolol  succinate (TOPROL -XL) 25 MG 24 hr tablet Take 1 tablet (25 mg total) by mouth daily. 90 tablet 3   Multiple Vitamin (MULTIVITAMIN WITH MINERALS) TABS tablet Take 1 tablet by mouth daily.     pantoprazole  (PROTONIX ) 40 MG tablet TAKE 1 TABLET BY MOUTH TWICE A DAY 180 tablet 1   pregabalin  (LYRICA ) 25 MG capsule TAKE 1 CAPSULE BY MOUTH 2 TIMES DAILY. 60 capsule 2   rosuvastatin  (CRESTOR ) 40 MG tablet TAKE 1 TABLET BY MOUTH EVERY DAY 90 tablet 3   Semaglutide , 1 MG/DOSE, (OZEMPIC , 1 MG/DOSE,) 4 MG/3ML SOPN Inject 1 mg into the skin once a week. 9 mL 3   tamsulosin  (FLOMAX ) 0.4 MG CAPS capsule TAKE 2 CAPSULES BY MOUTH EVERY DAY 180 capsule 1   insulin  lispro (HUMALOG ) 100 UNIT/ML KwikPen INJECT INSULIN  DOSE INTO THE SKIN BEFORE MEALS PER SLIDING SCALE. MAX  DOSE PER 24 HOURS: 42 UNITS. 15 mL 2   No current facility-administered medications on file prior to visit.   "

## 2024-04-27 NOTE — Progress Notes (Incomplete)
 "  Subjective:    Patient ID: Michael Gross, male    DOB: [birth year], 68 y.o.   MRN: [id number] Chief Complaint  Patient presents with   New Patient (Initial Visit)    Urgent. np. ABI + consult. Change of skin color, toe. Type 2 diabetes  ref: McDonough,Lauren  Right 2nd toe    HPI  Discussed the use of AI scribe software for clinical note transcription with the patient, who gave verbal consent to proceed.  History of Present Illness Michael Gross is a 68 year old male with type 2 diabetes and diabetic neuropathy who presents with acute toe discoloration.  He developed acute, diffuse violaceous discoloration of the dorsal aspect of one toe following exposure to cold, wet conditions while walking outdoors. A second toe exhibited mild, focal discoloration. He suspects cold exposure contributed, as his shoes became wet during the incident. He has a remote history of prior toe fracture and recalls a similar episode in the past that resolved with conservative measures.  He managed the episode by puncturing the affected toe with a needle, applying hydrogen peroxide, and using topical ointment. The discoloration resolved within one week for the more affected toe, while the second toe improved more gradually. He expressed concern regarding potential infection if untreated. He experiences only intermittent, mild pain in the affected area. He routinely wears thin socks due to discomfort with thicker socks and recently acquired rubber galoshes to prevent further cold exposure.  He reports chronic numbness in his feet and experiences nocturnal leg cramps that occasionally require ambulation for relief. He ambulates 2.5 to 3.5 miles two to three times per week, sometimes using a golf cart.    Results Diagnostic Ankle Brachial Index (Right) (2022): 1.1 Ankle Brachial Index (Left) (2022): 1.05  Ankle Brachial Index (ABI) with Toe Pressure Measurement Right ABI 1.21, left ABI 1.26. Great  toe pressures normal at 0.94 bilaterally. Triphasic waveforms in tibial vessels and great toes. Foot warm with 2+ dorsalis pedis and posterior tibial pulses bilaterally.   Review of Systems     Objective:   Physical Exam  Physical Exam EXTREMITIES: Feet warm with strong pulses. Hair present on feet.  BP 101/66   Pulse (!) 134   Ht 6' 4 (1.93 m)   Wt 213 lb 12.8 oz (97 kg)   BMI 26.02 kg/m   Past Medical History:  Diagnosis Date   Aortic atherosclerosis 07/2022   noted on CXR   Arthritis    ankles   Basal cell carcinoma    BPH (benign prostatic hyperplasia)    Complication of anesthesia    slow to wake   Diabetes (HCC)    a. diagnosed in his 40's.   Diastolic dysfunction    a. 11/2019 Echo: EF 60-65%, no rwma, nl RV fxn, no significant valvular abnormality; b. 07/2022 Echo: EF 60-65%, no rwma, GrI DD, nl RV fxn, RVSP 29.61mmHg, AoV sclerosis w/o stenosis.   Dysplastic nevus 12/11/2022   right lateral pectoral, moderate atypia   Hyperlipidemia    PAF (paroxysmal atrial fibrillation) (HCC)    a. 07/2022 noted in the setting of DKA - brief.  CHA2DS2VASc = 3-->OAC deferred for now; b. 10/2022 Zio: Predominantly sinus rhythm at 69 (49-171).  1 6 beat run of nonsustained VT.  8 brief runs of PSVT (fastest 8 beats).  Rare PAC and PVC.  Triggered event associated with sinus rhythm.  No atrial fibrillation.   Vertigo    none for over 15 years  Social History   Socioeconomic History   Marital status: Married    Spouse name: [name]   Number of children: 4   Years of education: Not on file   Highest education level: Not on file  Occupational History   Not on file  Tobacco Use   Smoking status: Never   Smokeless tobacco: Never  Vaping Use   Vaping status: Never Used  Substance and Sexual Activity   Alcohol use: Yes    Comment: rare   Drug use: No   Sexual activity: Yes  Other Topics Concern   Not on file  Social History Narrative   Not on file    Social Drivers of Health   Tobacco Use: Low Risk (04/27/2024)   Patient History    Smoking Tobacco Use: Never    Smokeless Tobacco Use: Never    Passive Exposure: Not on file  Financial Resource Strain: Not on file  Food Insecurity: No Food Insecurity (04/24/2023)   Hunger Vital Sign    Worried About Running Out of Food in the Last Year: Never true    Ran Out of Food in the Last Year: Never true  Transportation Needs: No Transportation Needs (04/24/2023)   PRAPARE - Administrator, Civil Service (Medical): No    Lack of Transportation (Non-Medical): No  Physical Activity: Not on file  Stress: Not on file  Social Connections: Not on file  Intimate Partner Violence: Not At Risk (04/24/2023)   Humiliation, Afraid, Rape, and Kick questionnaire    Fear of Current or Ex-Partner: No    Emotionally Abused: No    Physically Abused: No    Sexually Abused: No  Depression (PHQ2-9): Low Risk (04/24/2024)   Depression (PHQ2-9)    PHQ-2 Score: 0  Alcohol Screen: Low Risk (04/19/2023)   Alcohol Screen    Last Alcohol Screening Score (AUDIT): 0  Housing: Unknown (10/15/2023)   Received from [facility]   Epic    Unable to Pay for Housing in the Last Year: Not on file    Number of Times Moved in the Last Year: Not on file    At any time in the past 12 months, were you homeless or living in a shelter (including now)?: No  Utilities: Not At Risk (04/24/2023)   [facility] Utilities    Threatened with loss of utilities: No  Health Literacy: Not on file    Past Surgical History:  Procedure Laterality Date   COLONOSCOPY WITH PROPOFOL  N/A 07/27/2022   Procedure: COLONOSCOPY WITH PROPOFOL ;  Surgeon: Unk Corinn Skiff, MD;  Location: [facility];  Service: Endoscopy;  Laterality: N/A;  Diabetic   HERNIA REPAIR     2003   KNEE SURGERY     1998   MOHS SURGERY     2003   SHOULDER ARTHROSCOPY WITH OPEN ROTATOR CUFF REPAIR AND DISTAL  CLAVICLE ACROMINECTOMY Left 08/11/2021   Procedure: SHOULDER ARTHROSCOPY WITH OPEN ROTATOR CUFF REPAIR AND DISTAL CLAVICLE ACROMINECTOMY;  Surgeon: Marchia Drivers, MD;  Location: [facility];  Service: Orthopedics;  Laterality: Left;    Family History  Problem Relation Age of Onset   Heart disease Father    Prostate cancer Neg Hx    Kidney cancer Neg Hx    Bladder Cancer Neg Hx     Allergies[1]     Latest Ref Rng & Units 04/18/2024    2:16 PM 04/24/2023    2:31 PM 04/11/2023    9:59 AM  CBC  WBC  3.4 - 10.8 x10E3/uL 7.9  5.7  6.5   Hemoglobin 13.0 - 17.7 g/dL 85.3  86.8  86.2   Hematocrit 37.5 - 51.0 % 44.2  42.4  46.4   Platelets 150 - 450 x10E3/uL 209  189  213       CMP     Component Value Date/Time   NA 143 04/18/2024 1416   K 4.4 04/18/2024 1416   CL 106 04/18/2024 1416   CO2 22 04/18/2024 1416   GLUCOSE 91 04/18/2024 1416   GLUCOSE 165 (H) 08/16/2022 1025   BUN 19 04/18/2024 1416   CREATININE 0.94 04/18/2024 1416   CALCIUM  9.4 04/18/2024 1416   PROT 7.0 04/18/2024 1416   ALBUMIN 4.3 04/18/2024 1416   AST 37 04/18/2024 1416   ALT 30 04/18/2024 1416   ALKPHOS 76 04/18/2024 1416   BILITOT 0.4 04/18/2024 1416   GFR 109.70 04/19/2015 1706   EGFR 88 04/18/2024 1416   GFRNONAA >60 08/16/2022 1025     VAS US  ABI WITH/WO TBI Result Date: 04/21/2024  LOWER EXTREMITY DOPPLER STUDY Patient Name:  MINDY BEHNKEN  Date of Exam:   04/17/2024 Medical Rec #: [id number]           Accession #:    [id number] Date of Birth: 06/27/1955           Patient Gender: M Patient Age:   4 years Exam Location:  [facility] Procedure:      VAS US  ABI WITH/WO TBI Referring Phys: ORVIN DARING --------------------------------------------------------------------------------  Indications: Lt foot 2nd toe discoloration  Comparison Study: 09/2020 outside study NL-ABI Performing Technologist: Jerel Croak RVT  Examination Guidelines: A complete evaluation includes at minimum, Doppler  waveform signals and systolic blood pressure reading at the level of bilateral brachial, anterior tibial, and posterior tibial arteries, when vessel segments are accessible. Bilateral testing is considered an integral part of a complete examination. Photoelectric Plethysmograph (PPG) waveforms and toe systolic pressure readings are included as required and additional duplex testing as needed. Limited examinations for reoccurring indications may be performed as noted.  ABI Findings: +---------+------------------+-----+---------+--------+ Right    Rt Pressure (mmHg)IndexWaveform Comment  +---------+------------------+-----+---------+--------+ Brachial 123                                      +---------+------------------+-----+---------+--------+ PTA      151               1.21 triphasic         +---------+------------------+-----+---------+--------+ DP       144               1.15 triphasic         +---------+------------------+-----+---------+--------+ Great Toe117               0.94 Normal            +---------+------------------+-----+---------+--------+ +---------+------------------+-----+---------+-------+ Left     Lt Pressure (mmHg)IndexWaveform Comment +---------+------------------+-----+---------+-------+ Brachial 125                                     +---------+------------------+-----+---------+-------+ PTA      158               1.26 triphasic        +---------+------------------+-----+---------+-------+ DP       156  1.25 triphasic        +---------+------------------+-----+---------+-------+ [place] Toe118               0.94 Normal           +---------+------------------+-----+---------+-------+ +-------+-----------+-----------+------------+------------+ ABI/TBIToday's ABIToday's TBIPrevious ABIPrevious TBI +-------+-----------+-----------+------------+------------+ Right  1.21       .94        1.1                       +-------+-----------+-----------+------------+------------+ Left   1.26       .94        1.05                     +-------+-----------+-----------+------------+------------+ Bilateral ABIs appear essentially unchanged compared to prior study on 09/2020.  Summary: Right: Resting right ankle-brachial index is within normal range. The right toe-brachial index is normal.  Left: Resting left ankle-brachial index is within normal range. The left toe-brachial index is normal.  2nd toe shows normal waveform. *See table(s) above for measurements and observations.  Electronically signed by Cordella Shawl MD on 04/21/2024 at 8:29:06 AM.    Final        Assessment & Plan:   There are no diagnoses linked to this encounter.  Assessment and Plan Assessment & Plan Acute toe discoloration Acute purple discoloration likely due to vasospasm from cold exposure, possibly with minor trauma or blood blister. No evidence of peripheral arterial disease or critical limb ischemia. Diabetes increases risk for vascular issues. - Reviewed normal ankle-brachial index and toe pressure studies. - Advised monitoring for recurrent or persistent discoloration, pain, or new symptoms, and to seek care if these develop. - Discussed use of baby aspirin ; he is currently taking one daily and was advised that an additional dose may be beneficial. - Recommended wearing thin socks for comfort, thicker socks in very cold conditions if tolerable. - Suggested carrying an extra pair of socks to change into if current socks become wet. - Discussed use of rubber galoshes to maintain dry feet, which he has already implemented. - Advised continued local care for future episodes, including cleaning and topical ointment as previously performed. - Provided anticipatory guidance regarding signs of vascular compromise and instructed to seek care if these occur.     Medications Ordered Prior to Encounter[2]  There are no Patient Instructions  on file for this visit. No follow-ups on file.   Demere Dotzler E Heidee Audi, NP        [1] No Known Allergies [2] Current Outpatient Medications on File Prior to Visit  Medication Sig Dispense Refill   Ascorbic Acid (VITAMIN C PO) Take by mouth.     aspirin  EC 81 MG tablet Take 1 tablet (81 mg total) by mouth daily. Swallow whole. 30 tablet 0   Cholecalciferol (VITAMIN D3 PO) Take by mouth.     Cyanocobalamin  (VITAMIN B-12 PO) Take by mouth.     EMBECTA PEN NEEDLE NANO 2 GEN 32G X 4 MM MISC USE AS DIRECTED WITH SOLIQUA  TO INJECT INSULIN  EVERY MORNING E11.65 300 each 1   empagliflozin  (JARDIANCE ) 25 MG TABS tablet Take 1 tablet (25 mg total) by mouth daily. 30 tablet 5   ezetimibe  (ZETIA ) 10 MG tablet TAKE 1 TABLET BY MOUTH EVERY DAY 90 tablet 1   insulin  glargine (LANTUS  SOLOSTAR) 100 UNIT/ML Solostar Pen Inject 34 Units into the skin at bedtime. 15 mL 3   Lancets 30G MISC Use to check blood sugars 5 times  per day     meloxicam  (MOBIC ) 15 MG tablet TAKE 1 TABLET (15 MG TOTAL) BY MOUTH DAILY. 90 tablet 1   metoprolol  succinate (TOPROL -XL) 25 MG 24 hr tablet Take 1 tablet (25 mg total) by mouth daily. 90 tablet 3   Multiple Vitamin (MULTIVITAMIN WITH MINERALS) TABS tablet Take 1 tablet by mouth daily.     pantoprazole  (PROTONIX ) 40 MG tablet TAKE 1 TABLET BY MOUTH TWICE A DAY 180 tablet 1   pregabalin  (LYRICA ) 25 MG capsule TAKE 1 CAPSULE BY MOUTH 2 TIMES DAILY. 60 capsule 2   rosuvastatin  (CRESTOR ) 40 MG tablet TAKE 1 TABLET BY MOUTH EVERY DAY 90 tablet 3   Semaglutide , 1 MG/DOSE, (OZEMPIC , 1 MG/DOSE,) 4 MG/3ML SOPN Inject 1 mg into the skin once a week. 9 mL 3   tamsulosin  (FLOMAX ) 0.4 MG CAPS capsule TAKE 2 CAPSULES BY MOUTH EVERY DAY 180 capsule 1   insulin  lispro (HUMALOG ) 100 UNIT/ML KwikPen INJECT INSULIN  DOSE INTO THE SKIN BEFORE MEALS PER SLIDING SCALE. MAX DOSE PER 24 HOURS: 42 UNITS. 15 mL 2   No current facility-administered medications on file prior to visit.  "

## 2024-05-08 ENCOUNTER — Encounter: Payer: Self-pay | Admitting: Nurse Practitioner

## 2024-05-20 ENCOUNTER — Other Ambulatory Visit: Payer: Self-pay | Admitting: Nurse Practitioner

## 2024-05-20 DIAGNOSIS — E1165 Type 2 diabetes mellitus with hyperglycemia: Secondary | ICD-10-CM

## 2024-05-29 ENCOUNTER — Telehealth: Payer: Self-pay

## 2024-05-29 DIAGNOSIS — E1165 Type 2 diabetes mellitus with hyperglycemia: Secondary | ICD-10-CM

## 2024-05-29 MED ORDER — NOVOLOG FLEXPEN 100 UNIT/ML ~~LOC~~ SOPN: PEN_INJECTOR | SUBCUTANEOUS | 11 refills | Status: AC

## 2024-05-29 MED ORDER — LANTUS SOLOSTAR 100 UNIT/ML ~~LOC~~ SOPN: 34.0000 [IU] | PEN_INJECTOR | Freq: Every day | SUBCUTANEOUS | 3 refills | Status: AC

## 2024-05-29 NOTE — Telephone Encounter (Signed)
 Pt wife notified.

## 2024-05-29 NOTE — Telephone Encounter (Signed)
 Done

## 2024-05-29 NOTE — Telephone Encounter (Signed)
 Humalog  changed to novolog  for mealtime insulin  and continue lantus  insulin  daily

## 2024-07-23 ENCOUNTER — Ambulatory Visit: Admitting: Dermatology

## 2024-07-28 ENCOUNTER — Ambulatory Visit: Admitting: Nurse Practitioner

## 2025-04-27 ENCOUNTER — Ambulatory Visit: Admitting: Nurse Practitioner
